# Patient Record
Sex: Female | Born: 1995 | Race: Black or African American | Hispanic: No | Marital: Married | Smoking: Current every day smoker
Health system: Southern US, Community
[De-identification: ages and names within clinical notes are randomized; demographics above are authoritative.]

## PROBLEM LIST (undated history)

## (undated) ENCOUNTER — Inpatient Hospital Stay (HOSPITAL_COMMUNITY): Payer: Self-pay

## (undated) DIAGNOSIS — D649 Anemia, unspecified: Secondary | ICD-10-CM

## (undated) DIAGNOSIS — Z3490 Encounter for supervision of normal pregnancy, unspecified, unspecified trimester: Secondary | ICD-10-CM

## (undated) DIAGNOSIS — Z789 Other specified health status: Secondary | ICD-10-CM

## (undated) DIAGNOSIS — N83209 Unspecified ovarian cyst, unspecified side: Secondary | ICD-10-CM

## (undated) DIAGNOSIS — F32A Depression, unspecified: Secondary | ICD-10-CM

## (undated) DIAGNOSIS — R519 Headache, unspecified: Secondary | ICD-10-CM

## (undated) DIAGNOSIS — F419 Anxiety disorder, unspecified: Secondary | ICD-10-CM

## (undated) DIAGNOSIS — F329 Major depressive disorder, single episode, unspecified: Secondary | ICD-10-CM

## (undated) DIAGNOSIS — R51 Headache: Secondary | ICD-10-CM

## (undated) HISTORY — PX: NO PAST SURGERIES: SHX2092

---
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
## 2015-03-03 ENCOUNTER — Emergency Department (HOSPITAL_COMMUNITY)
Admission: EM | Admit: 2015-03-03 | Discharge: 2015-03-03 | Disposition: A | Discharge status: Home / Self Care | Payer: Medicaid Other | Attending: Emergency Medicine | Admitting: Emergency Medicine

## 2015-03-03 ENCOUNTER — Encounter (HOSPITAL_COMMUNITY): Payer: Self-pay | Admitting: Emergency Medicine

## 2015-03-03 DIAGNOSIS — Z79818 Long term (current) use of other agents affecting estrogen receptors and estrogen levels: Secondary | ICD-10-CM | POA: Insufficient documentation

## 2015-03-03 DIAGNOSIS — Z3202 Encounter for pregnancy test, result negative: Secondary | ICD-10-CM | POA: Insufficient documentation

## 2015-03-03 DIAGNOSIS — Z79899 Other long term (current) drug therapy: Secondary | ICD-10-CM | POA: Insufficient documentation

## 2015-03-03 DIAGNOSIS — D649 Anemia, unspecified: Secondary | ICD-10-CM | POA: Insufficient documentation

## 2015-03-03 DIAGNOSIS — F17210 Nicotine dependence, cigarettes, uncomplicated: Secondary | ICD-10-CM | POA: Insufficient documentation

## 2015-03-03 DIAGNOSIS — K529 Noninfective gastroenteritis and colitis, unspecified: Secondary | ICD-10-CM | POA: Insufficient documentation

## 2015-03-03 LAB — LIPASE, BLOOD: LIPASE: 23 U/L (ref 11–51)

## 2015-03-03 LAB — CBC
HEMATOCRIT: 42.6 % (ref 36.0–46.0)
Hemoglobin: 14.7 g/dL (ref 12.0–15.0)
MCH: 32.1 pg (ref 26.0–34.0)
MCHC: 34.5 g/dL (ref 30.0–36.0)
MCV: 93 fL (ref 78.0–100.0)
Platelets: 230 10*3/uL (ref 150–400)
RBC: 4.58 MIL/uL (ref 3.87–5.11)
RDW: 13.2 % (ref 11.5–15.5)
WBC: 6.7 10*3/uL (ref 4.0–10.5)

## 2015-03-03 LAB — URINALYSIS, ROUTINE W REFLEX MICROSCOPIC
BILIRUBIN URINE: NEGATIVE
Glucose, UA: NEGATIVE mg/dL
Hgb urine dipstick: NEGATIVE
KETONES UR: 15 mg/dL — AB
LEUKOCYTES UA: NEGATIVE
NITRITE: NEGATIVE
PH: 5.5 (ref 5.0–8.0)
PROTEIN: NEGATIVE mg/dL
Specific Gravity, Urine: 1.028 (ref 1.005–1.030)

## 2015-03-03 LAB — I-STAT BETA HCG BLOOD, ED (MC, WL, AP ONLY)

## 2015-03-03 LAB — COMPREHENSIVE METABOLIC PANEL
ALBUMIN: 4.1 g/dL (ref 3.5–5.0)
ALT: 29 U/L (ref 14–54)
AST: 24 U/L (ref 15–41)
Alkaline Phosphatase: 68 U/L (ref 38–126)
Anion gap: 9 (ref 5–15)
BUN: 10 mg/dL (ref 6–20)
CALCIUM: 9.3 mg/dL (ref 8.9–10.3)
CHLORIDE: 107 mmol/L (ref 101–111)
CO2: 22 mmol/L (ref 22–32)
Creatinine, Ser: 0.68 mg/dL (ref 0.44–1.00)
GFR calc non Af Amer: >60 mL/min (ref >60)
Glucose, Bld: 103 mg/dL — ABNORMAL HIGH (ref 65–99)
Potassium: 4.1 mmol/L (ref 3.5–5.1)
SODIUM: 138 mmol/L (ref 135–145)
TOTAL PROTEIN: 7.1 g/dL (ref 6.5–8.1)
Total Bilirubin: 0.8 mg/dL (ref 0.3–1.2)

## 2015-03-03 MED ORDER — ONDANSETRON 4 MG PO TBDP
ORAL_TABLET | ORAL | Status: AC
  Filled 2015-03-03: qty 1

## 2015-03-03 MED ORDER — ONDANSETRON 4 MG PO TBDP
4.0000 mg | ORAL_TABLET | Freq: Once | ORAL | Status: AC | PRN
  Administered 2015-03-03: 4 mg via ORAL

## 2015-03-03 MED ORDER — KETOROLAC TROMETHAMINE 60 MG/2ML IM SOLN
60.0000 mg | Freq: Once | INTRAMUSCULAR | Status: AC
  Administered 2015-03-03: 60 mg via INTRAMUSCULAR
  Filled 2015-03-03: qty 2

## 2015-03-03 MED ORDER — ONDANSETRON HCL 4 MG PO TABS: 4.0000 mg | ORAL_TABLET | Freq: Four times a day (QID) | ORAL | Status: DC

## 2015-03-03 NOTE — ED Notes (Signed)
Pt stable, ambulatory, states understanding of discharge instructions 

## 2015-03-03 NOTE — Discharge Instructions (Signed)
Return to ED with fevers, chills, worsening abdominal pain, change in abdominal pain, nausea and vomiting not controlled with zofran, blood in your vomit or stool, passing out, vaginal discharge or any other new, worsening or concerning symptoms.  Viral Gastroenteritis  Viral gastroenteritis is also known as stomach flu. This condition affects the stomach and intestinal tract. It can cause sudden diarrhea and vomiting. The illness typically lasts 3 to 8 days. Most people develop an immune response that eventually gets rid of the virus. While this natural response develops, the virus can make you quite ill.  CAUSES  Many different viruses can cause gastroenteritis, such as rotavirus or noroviruses. You can catch one of these viruses by consuming contaminated food or water. You may also catch a virus by sharing utensils or other personal items with an infected person or by touching a contaminated surface.  SYMPTOMS  The most common symptoms are diarrhea and vomiting. These problems can cause a severe loss of body fluids (dehydration) and a body salt (electrolyte) imbalance. Other symptoms may include:  Fever.  Headache.  Fatigue.  Abdominal pain. DIAGNOSIS  Your caregiver can usually diagnose viral gastroenteritis based on your symptoms and a physical exam. A stool sample may also be taken to test for the presence of viruses or other infections.  TREATMENT  This illness typically goes away on its own. Treatments are aimed at rehydration. The most serious cases of viral gastroenteritis involve vomiting so severely that you are not able to keep fluids down. In these cases, fluids must be given through an intravenous line (IV).  HOME CARE INSTRUCTIONS  Drink enough fluids to keep your urine clear or pale yellow. Drink small amounts of fluids frequently and increase the amounts as tolerated.  Ask your caregiver for specific rehydration instructions.  Avoid:  Foods high in sugar.  Alcohol.  Carbonated  drinks.  Tobacco.  Juice.  Caffeine drinks.  Extremely hot or cold fluids.  Fatty, greasy foods.  Too much intake of anything at one time.  Dairy products until 24 to 48 hours after diarrhea stops. You may consume probiotics. Probiotics are active cultures of beneficial bacteria. They may lessen the amount and number of diarrheal stools in adults. Probiotics can be found in yogurt with active cultures and in supplements.  Wash your hands well to avoid spreading the virus.  Only take over-the-counter or prescription medicines for pain, discomfort, or fever as directed by your caregiver. Do not give aspirin to children. Antidiarrheal medicines are not recommended.  Ask your caregiver if you should continue to take your regular prescribed and over-the-counter medicines.  Keep all follow-up appointments as directed by your caregiver. SEEK IMMEDIATE MEDICAL CARE IF:  You are unable to keep fluids down.  You do not urinate at least once every 6 to 8 hours.  You develop shortness of breath.  You notice blood in your stool or vomit. This may look like coffee grounds.  You have abdominal pain that increases or is concentrated in one small area (localized).  You have persistent vomiting or diarrhea.  You have a fever.  The patient is a child younger than 3 months, and he or she has a fever.  The patient is a child older than 3 months, and he or she has a fever and persistent symptoms.  The patient is a child older than 3 months, and he or she has a fever and symptoms suddenly get worse.  The patient is a baby, and he or she has  no tears when crying. MAKE SURE YOU:  Understand these instructions.  Will watch your condition.  Will get help right away if you are not doing well or get worse. This information is not intended to replace advice given to you by your health care provider. Make sure you discuss any questions you have with your health care provider.  Document Released: 12/26/2004 Document  Revised: 03/20/2011 Document Reviewed: 10/12/2010  Elsevier Interactive Patient Education [employer]

## 2015-03-03 NOTE — ED Provider Notes (Signed)
CSN: [id number]     Arrival date & time 03/03/15  1315 History  By signing my name below, I, Budd Palmer, attest that this documentation has been prepared under the direction and in the presence of Cherese Lozano, PA-C. Electronically Signed: Budd Palmer, ED Scribe. 03/03/2015. 4:12 PM.     Chief Complaint  Patient presents with  . Abdominal Pain  . Diarrhea   Patient is a 20 y.o. female presenting with abdominal pain. The history is provided by the patient. No language interpreter was used.  Abdominal Pain Pain location:  Generalized Pain quality: sharp and stabbing   Pain radiates to:  Does not radiate Pain severity:  Moderate Duration:  1 day Timing:  Constant Progression:  Unchanged Context: sick contacts   Context: not awakening from sleep, not diet changes, not eating and not suspicious food intake   Relieved by:  Not moving and lying down Worsened by:  Movement Ineffective treatments:  None tried Associated symptoms: belching, diarrhea, nausea and vomiting   Associated symptoms: no chills, no fever, no hematemesis, no hematochezia and no vaginal discharge    HPI Comments: Nancy Henson is a 20 y.o. female smoker at 0.50 ppd who presents to the Emergency Department complaining of constant, stabbing, worsening, diffuse abdominal pain and diarrhea onset 1 day ago. She reports associated nausea, vomiting, generalized fatigue, and increased burping. She denies blood in her vomit or stool. She states she has not been able to eat or drink since last night due to the nausea. She notes alleviation of the pain with lying still, and exacerbation with standing up and movement. She reports no change of the pain with having a BM. She notes a sick contact (boyfriend) with similar symptoms. She notes she is currently menstruating. She denies any concern for STD's. Denies fevers, chills, headache, dizziness, syncope, URI symptoms, cough, hematuria, back pain, flank pain, vaginal discharge  or pelvic pain. No hx of abdominal surgeries.   Past Medical History  Diagnosis Date  . No pertinent past medical history   . Headache   . Anemia    Past Surgical History  Procedure Laterality Date  . No past surgeries     Family History  Problem Relation Age of Onset  . Alcohol abuse Neg Hx   . Arthritis Neg Hx   . Asthma Neg Hx   . Birth defects Neg Hx   . Cancer Neg Hx   . COPD Neg Hx   . Depression Neg Hx   . Diabetes Neg Hx   . Drug abuse Neg Hx   . Early death Neg Hx   . Hearing loss Neg Hx   . Heart disease Neg Hx   . Hyperlipidemia Neg Hx   . Hypertension Neg Hx   . Kidney disease Neg Hx   . Learning disabilities Neg Hx   . Mental illness Neg Hx   . Mental retardation Neg Hx   . Miscarriages / Stillbirths Neg Hx   . Stroke Neg Hx   . Vision loss Neg Hx   . Varicose Veins Neg Hx    Social History  Substance Use Topics  . Smoking status: Current Every Day Smoker -- 0.50 packs/day    Types: Cigarettes  . Smokeless tobacco: Never Used  . Alcohol Use: No   OB History    Gravida Para Term Preterm AB TAB SAB Ectopic Multiple Living   3 3 3       0 3     Review of  Systems  Constitutional: Negative for fever and chills.  Gastrointestinal: Positive for nausea, vomiting, abdominal pain and diarrhea. Negative for hematochezia and hematemesis.  Genitourinary: Negative for vaginal discharge.  Neurological: Positive for weakness.  All other systems reviewed and are negative.   Allergies  Review of patient's allergies indicates no known allergies.  Home Medications   Prior to Admission medications   Medication Sig Start Date End Date Taking? Authorizing Provider  butalbital-acetaminophen-caffeine (ESGIC PLUS) 50-500-40 MG tablet Take 1-2 tablets by mouth every 4 (four) hours as needed for pain or headache. 11/16/14   Adam Phenix, MD  ferrous sulfate 325 (65 FE) MG tablet Take 325 mg by mouth daily with breakfast.    Historical Provider, MD  ibuprofen  (ADVIL,MOTRIN) 600 MG tablet Take 1 tablet (600 mg total) by mouth every 6 (six) hours. 10/12/14   Montez Morita, CNM  norgestimate-ethinyl estradiol (ORTHO-CYCLEN,SPRINTEC,PREVIFEM) 0.25-35 MG-MCG tablet Take 1 tablet by mouth daily. 12/01/14   Peggy Constant, MD  ondansetron (ZOFRAN) 4 MG tablet Take 1 tablet (4 mg total) by mouth every 6 (six) hours. 03/03/15   Bonni Neuser, PA-C  phenylephrine-shark liver oil-mineral oil-petrolatum (PREPARATION H) 0.25-3-14-71.9 % rectal ointment Place 1 application rectally 2 (two) times daily as needed for hemorrhoids.    Historical Provider, MD   BP 114/78 mmHg  Pulse 89  Temp(Src) 98 F (36.7 C) (Oral)  Resp 16  SpO2 98%  LMP 03/03/2015 Physical Exam  Constitutional: She is oriented to person, place, and time. She appears well-developed and well-nourished.  Nontoxic appearing  HENT:  Head: Normocephalic and atraumatic.  Eyes: Conjunctivae and EOM are normal. Right eye exhibits no discharge. Left eye exhibits no discharge.  Neck: Normal range of motion. Neck supple.  Cardiovascular: Normal rate, regular rhythm and normal heart sounds.   Pulmonary/Chest: Effort normal and breath sounds normal. No respiratory distress.  Abdominal: Soft. Normal appearance. Bowel sounds are decreased. There is generalized tenderness. There is no rigidity, no rebound, no guarding and no CVA tenderness.  Mild generalized tenderness without peritoneal signs. No focal tenderness.   Musculoskeletal: Normal range of motion.  Walks with a steady gait  Neurological: She is alert and oriented to person, place, and time. Coordination normal.  Skin: Skin is warm and dry. She is not diaphoretic.  Psychiatric: She has a normal mood and affect. Her behavior is normal.  Nursing note and vitals reviewed.   ED Course  Procedures  DIAGNOSTIC STUDIES: Oxygen Saturation is 98% on RA, normal by my interpretation.    COORDINATION OF CARE: 4:12 PM - Discussed normal lab results,  probable viral gastroenteritis, and plans to wait on urinalysis. Will order medication for nausea and pain. Will also write a note for work. Pt advised of plan for treatment and pt agrees.  Labs Review Labs Reviewed  COMPREHENSIVE METABOLIC PANEL - Abnormal; Notable for the following:    Glucose, Bld 103 (*)    All other components within normal limits  URINALYSIS, ROUTINE W REFLEX MICROSCOPIC (NOT AT [facility]) - Abnormal; Notable for the following:    Color, Urine AMBER (*)    Ketones, ur 15 (*)    All other components within normal limits  LIPASE, BLOOD  CBC  I-STAT BETA HCG BLOOD, ED ([facility], [facility], [facility] ONLY)    Imaging Review No results found. I have personally reviewed and evaluated these images and lab results as part of my medical decision-making.   EKG Interpretation None      MDM  Final diagnoses:  Gastroenteritis   Patient presenting with abdominal pain, nausea, vomiting and diarrhea x 1 day. Symptoms consistent with viral gastroenteritis. Toradol and zofran given in ED. Pt declines IV stating that she does not want one and will drink ginger ale instead. Vitals are stable, patient is afebrile and appears in no acute distress. Mucus membranes are moist. Abdomen is soft with mild generalized tenderness. No focal tenderness and no peritoneal signs. Discussed that there is no indication for imaging at this time. Blood work reviewed and unremarkable. Patient is tolerating PO liquids before discharge. Will discharge home with supportive therapy. Strict return precautions discussed with patient at bedside and given in discharge paperwork. Patient is stable for discharge.   I personally performed the services described in this documentation, which was scribed in my presence. The recorded information has been reviewed and is accurate.   Rolm Gala Blanca Thornton, PA-C 03/03/15 1823  Alvira Monday, MD 03/04/15 1242

## 2015-03-03 NOTE — ED Notes (Signed)
Pt from home for eva of lower abd pain x2 days with n/v/d. Pt states she is currently on menstrual, denies any hemoptysis or blood in stool pt states doesn't have an appetite.

## 2015-03-18 ENCOUNTER — Emergency Department (INDEPENDENT_AMBULATORY_CARE_PROVIDER_SITE_OTHER)
Admission: EM | Admit: 2015-03-18 | Discharge: 2015-03-18 | Disposition: A | Discharge status: Home / Self Care | Payer: Self-pay | Source: Home / Self Care | Attending: Emergency Medicine | Admitting: Emergency Medicine

## 2015-03-18 ENCOUNTER — Encounter (HOSPITAL_COMMUNITY): Payer: Self-pay | Admitting: Emergency Medicine

## 2015-03-18 DIAGNOSIS — J069 Acute upper respiratory infection, unspecified: Secondary | ICD-10-CM

## 2015-03-18 MED ORDER — IPRATROPIUM BROMIDE 0.06 % NA SOLN: 2.0000 | Freq: Four times a day (QID) | NASAL | Status: DC

## 2015-03-18 NOTE — ED Provider Notes (Signed)
CSN: [id number]     Arrival date & time 03/18/15  1413 History   First MD Initiated Contact with Patient 03/18/15 1505     Chief Complaint  Patient presents with  . URI   (Consider location/radiation/quality/duration/timing/severity/associated sxs/prior Treatment) HPI Comments: 20 year old female complaining of a cough, sniffles, stuffy nose, posttussive emesis, PND. She states yesterday her temperature of 103. She had a headache and was in bed all day yesterday. She has also been working 2 jobs over the past couple of weeks. 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 Currently she is afebrile.  Patient is a 20 y.o. female presenting with URI.  URI Presenting symptoms: congestion, cough, fever and rhinorrhea   Presenting symptoms: no fatigue   Associated symptoms: no neck pain     Past Medical History  Diagnosis Date  . No pertinent past medical history   . Headache   . Anemia    Past Surgical History  Procedure Laterality Date  . No past surgeries     Family History  Problem Relation Age of Onset  . Alcohol abuse Neg Hx   . Arthritis Neg Hx   . Asthma Neg Hx   . Birth defects Neg Hx   . Cancer Neg Hx   . COPD Neg Hx   . Depression Neg Hx   . Diabetes Neg Hx   . Drug abuse Neg Hx   . Early death Neg Hx   . Hearing loss Neg Hx   . Heart disease Neg Hx   . Hyperlipidemia Neg Hx   . Hypertension Neg Hx   . Kidney disease Neg Hx   . Learning disabilities Neg Hx   . Mental illness Neg Hx   . Mental retardation Neg Hx   . Miscarriages / Stillbirths Neg Hx   . Stroke Neg Hx   . Vision loss Neg Hx   . Varicose Veins Neg Hx    Social History  Substance Use Topics  . Smoking status: Current Every Day Smoker -- 0.50 packs/day    Types: Cigarettes  . Smokeless tobacco: Never Used  . Alcohol Use: No   OB History    Gravida Para Term Preterm AB TAB SAB Ectopic Multiple Living   0 3     Review of Systems  Constitutional: Positive for fever,  activity change and appetite change. Negative for chills and fatigue.  HENT: Positive for congestion, postnasal drip and rhinorrhea. Negative for facial swelling.   Eyes: Negative.   Respiratory: Positive for cough. Negative for shortness of breath.   Cardiovascular: Negative.   Gastrointestinal: Negative for abdominal pain.  Musculoskeletal: Negative for neck pain and neck stiffness.  Skin: Negative for pallor and rash.  Neurological: Negative.     Allergies  Review of patient's allergies indicates no known allergies.  Home Medications   Prior to Admission medications   Medication Sig Start Date End Date Taking? Authorizing Provider  etonogestrel (NEXPLANON) 68 MG IMPL implant 1 each by Subdermal route once.   Yes Historical Provider, MD  guaiFENesin (MUCINEX) 600 MG 12 hr tablet Take by mouth 2 (two) times daily.   Yes Historical Provider, MD  Pseudoeph-Doxylamine-DM-APAP (NYQUIL PO) Take by mouth.   Yes Historical Provider, MD  butalbital-acetaminophen-caffeine (ESGIC PLUS) 50-500-40 MG tablet Take 1-2 tablets by mouth every 4 (four) hours as needed for pain or headache. 11/16/14   Adam Phenix, MD  ferrous sulfate 325 (65 FE) MG  tablet Take 325 mg by mouth daily with breakfast.    Historical Provider, MD  ibuprofen (ADVIL,MOTRIN) 600 MG tablet Take 1 tablet (600 mg total) by mouth every 6 (six) hours. 10/12/14   Montez MoritaMarie D Lawson, CNM  ipratropium (ATROVENT) 0.06 % nasal spray Place 2 sprays into both nostrils 4 (four) times daily. 03/18/15   Hayden Rasmussenavid Rekisha Welling, NP  norgestimate-ethinyl estradiol (ORTHO-CYCLEN,SPRINTEC,PREVIFEM) 0.25-35 MG-MCG tablet Take 1 tablet by mouth daily. Patient not taking: Reported on 03/18/2015 12/01/14   Catalina AntiguaPeggy Constant, MD  ondansetron (ZOFRAN) 4 MG tablet Take 1 tablet (4 mg total) by mouth every 6 (six) hours. 03/03/15   Stevi Barrett, PA-C  phenylephrine-shark liver oil-mineral oil-petrolatum (PREPARATION H) 0.25-3-14-71.9 % rectal ointment Place 1 application rectally  2 (two) times daily as needed for hemorrhoids.    Historical Provider, MD   Meds Ordered and Administered this Visit  Medications - No data to display  BP 110/72 mmHg  Pulse 69  Temp(Src) 98.2 F (36.8 C) (Oral)  Resp 20  SpO2 99%  LMP 03/03/2015 No data found.   Physical Exam  Constitutional: She is oriented to person, place, and time. She appears well-developed and well-nourished. No distress.  HENT:  Bilateral TMs are normal Oropharynx with minor erythema and copious amount of clear PND. No exudates or swelling.  Eyes: Conjunctivae and EOM are normal.  Neck: Normal range of motion. Neck supple.  Cardiovascular: Normal rate, regular rhythm and normal heart sounds.   Pulmonary/Chest: Effort normal and breath sounds normal. No respiratory distress. She has no wheezes. She has no rales.  Musculoskeletal: Normal range of motion. She exhibits no edema.  Lymphadenopathy:    She has no cervical adenopathy.  Neurological: She is alert and oriented to person, place, and time. She exhibits normal muscle tone.  Skin: Skin is warm and dry. No rash noted.  Psychiatric: She has a normal mood and affect.  Nursing note and vitals reviewed.   ED Course  Procedures (including critical care time)  Labs Review Labs Reviewed - No data to display  Imaging Review No results found.   Visual Acuity Review  Right Eye Distance:   Left Eye Distance:   Bilateral Distance:    Right Eye Near:   Left Eye Near:    Bilateral Near:         MDM   1. URI (upper respiratory infection)    Upper Respiratory Infection, Adult You may continue to use NyQuil or Alka-Seltzer cold plus. Or you may take the following medications for symptoms. For nasal and head congestion may take Sudafed PE 10 mg every 4 hours as needed. Saline nasal spray used frequently. For drainage may use Allegra, Claritin or Zyrtec. If you need stronger medicine to stop drainage may take Chlor-Trimeton 2-4 mg every 4  hours. This may cause drowsiness. Ibuprofen 600 mg every 6 hours as needed for pain, discomfort or fever. Drink plenty of fluids and stay well-hydrated. Atrovent nasal spray for runny nose and sniffles    Hayden Rasmussenavid Yamira Papa, NP 03/18/15 1525

## 2015-03-18 NOTE — ED Notes (Signed)
Complaints of not feeling well for 1-3 weeks.  Patient has tried otc cold medicines

## 2015-03-18 NOTE — Discharge Instructions (Signed)
Upper Respiratory Infection, Adult You may continue to use NyQuil or Alka-Seltzer cold plus. Or you may take the following medications for symptoms. For nasal and head congestion may take Sudafed PE 10 mg every 4 hours as needed. Saline nasal spray used frequently. For drainage may use Allegra, Claritin or Zyrtec. If you need stronger medicine to stop drainage may take Chlor-Trimeton 2-4 mg every 4 hours. This may cause drowsiness. Ibuprofen 600 mg every 6 hours as needed for pain, discomfort or fever. Drink plenty of fluids and stay well-hydrated. Atrovent nasal spray for runny nose and sniffles  Most upper respiratory infections (URIs) are a viral infection of the air passages leading to the lungs. A URI affects the nose, throat, and upper air passages. The most common type of URI is nasopharyngitis and is typically referred to as "the common cold." URIs run their course and usually go away on their own. Most of the time, a URI does not require medical attention, but sometimes a bacterial infection in the upper airways can follow a viral infection. This is called a secondary infection. Sinus and middle ear infections are common types of secondary upper respiratory infections. Bacterial pneumonia can also complicate a URI. A URI can worsen asthma and chronic obstructive pulmonary disease (COPD). Sometimes, these complications can require emergency medical care and may be life threatening.  CAUSES Almost all URIs are caused by viruses. A virus is a type of germ and can spread from one person to another.  RISKS FACTORS You may be at risk for a URI if:   You smoke.   You have chronic heart or lung disease.  You have a weakened defense (immune) system.   You are very young or very old.   You have nasal allergies or asthma.  You work in crowded or poorly ventilated areas.  You work in health care facilities or schools. SIGNS AND SYMPTOMS  Symptoms typically develop 2-3 days after you  come in contact with a cold virus. Most viral URIs last 7-10 days. However, viral URIs from the influenza virus (flu virus) can last 14-18 days and are typically more severe. Symptoms may include:   Runny or stuffy (congested) nose.   Sneezing.   Cough.   Sore throat.   Headache.   Fatigue.   Fever.   Loss of appetite.   Pain in your forehead, behind your eyes, and over your cheekbones (sinus pain).  Muscle aches.  DIAGNOSIS  Your health care provider may diagnose a URI by:  Physical exam.  Tests to check that your symptoms are not due to another condition such as:  Strep throat.  Sinusitis.  Pneumonia.  Asthma. TREATMENT  A URI goes away on its own with time. It cannot be cured with medicines, but medicines may be prescribed or recommended to relieve symptoms. Medicines may help:  Reduce your fever.  Reduce your cough.  Relieve nasal congestion. HOME CARE INSTRUCTIONS   Take medicines only as directed by your health care provider.   Gargle warm saltwater or take cough drops to comfort your throat as directed by your health care provider.  Use a warm mist humidifier or inhale steam from a shower to increase air moisture. This may make it easier to breathe.  Drink enough fluid to keep your urine clear or pale yellow.   Eat soups and other clear broths and maintain good nutrition.   Rest as needed.   Return to work when your temperature has returned to normal or as  your health care provider advises. You may need to stay home longer to avoid infecting others. You can also use a face mask and careful hand washing to prevent spread of the virus.  Increase the usage of your inhaler if you have asthma.   Do not use any tobacco products, including cigarettes, chewing tobacco, or electronic cigarettes. If you need help quitting, ask your health care provider. PREVENTION  The best way to protect yourself from getting a cold is to practice good  hygiene.   Avoid oral or hand contact with people with cold symptoms.   Wash your hands often if contact occurs.  There is no clear evidence that vitamin C, vitamin E, echinacea, or exercise reduces the chance of developing a cold. However, it is always recommended to get plenty of rest, exercise, and practice good nutrition.  SEEK MEDICAL CARE IF:   You are getting worse rather than better.   Your symptoms are not controlled by medicine.   You have chills.  You have worsening shortness of breath.  You have brown or red mucus.  You have yellow or brown nasal discharge.  You have pain in your face, especially when you bend forward.  You have a fever.  You have swollen neck glands.  You have pain while swallowing.  You have white areas in the back of your throat. SEEK IMMEDIATE MEDICAL CARE IF:   You have severe or persistent:  Headache.  Ear pain.  Sinus pain.  Chest pain.  You have chronic lung disease and any of the following:  Wheezing.  Prolonged cough.  Coughing up blood.  A change in your usual mucus.  You have a stiff neck.  You have changes in your:  Vision.  Hearing.  Thinking.  Mood. MAKE SURE YOU:   Understand these instructions.  Will watch your condition.  Will get help right away if you are not doing well or get worse.   This information is not intended to replace advice given to you by your health care provider. Make sure you discuss any questions you have with your health care provider.   Document Released: 06/21/2000 Document Revised: 05/12/2014 Document Reviewed: 04/02/2013 Elsevier Interactive Patient Education [employer]

## 2015-03-23 ENCOUNTER — Emergency Department (HOSPITAL_COMMUNITY)
Admission: EM | Admit: 2015-03-23 | Discharge: 2015-03-23 | Disposition: A | Discharge status: Home / Self Care | Payer: Medicaid Other | Attending: Emergency Medicine | Admitting: Emergency Medicine

## 2015-03-23 ENCOUNTER — Encounter (HOSPITAL_COMMUNITY): Payer: Self-pay

## 2015-03-23 DIAGNOSIS — R51 Headache: Secondary | ICD-10-CM | POA: Insufficient documentation

## 2015-03-23 DIAGNOSIS — R05 Cough: Secondary | ICD-10-CM | POA: Insufficient documentation

## 2015-03-23 DIAGNOSIS — R112 Nausea with vomiting, unspecified: Secondary | ICD-10-CM | POA: Insufficient documentation

## 2015-03-23 DIAGNOSIS — D649 Anemia, unspecified: Secondary | ICD-10-CM | POA: Insufficient documentation

## 2015-03-23 DIAGNOSIS — F17210 Nicotine dependence, cigarettes, uncomplicated: Secondary | ICD-10-CM | POA: Insufficient documentation

## 2015-03-23 DIAGNOSIS — R519 Headache, unspecified: Secondary | ICD-10-CM

## 2015-03-23 DIAGNOSIS — Z79899 Other long term (current) drug therapy: Secondary | ICD-10-CM | POA: Insufficient documentation

## 2015-03-23 MED ORDER — METOCLOPRAMIDE HCL 10 MG PO TABS
10.0000 mg | ORAL_TABLET | Freq: Once | ORAL | Status: AC
  Administered 2015-03-23: 10 mg via ORAL
  Filled 2015-03-23: qty 1

## 2015-03-23 MED ORDER — DIVALPROEX SODIUM 250 MG PO DR TAB
250.0000 mg | DELAYED_RELEASE_TABLET | Freq: Once | ORAL | Status: AC
  Administered 2015-03-23: 250 mg via ORAL
  Filled 2015-03-23: qty 1

## 2015-03-23 MED ORDER — KETOROLAC TROMETHAMINE 30 MG/ML IJ SOLN
30.0000 mg | Freq: Once | INTRAMUSCULAR | Status: AC
  Administered 2015-03-23: 30 mg via INTRAMUSCULAR
  Filled 2015-03-23: qty 1

## 2015-03-23 MED ORDER — DIPHENHYDRAMINE HCL 25 MG PO CAPS
50.0000 mg | ORAL_CAPSULE | Freq: Once | ORAL | Status: AC
  Administered 2015-03-23: 50 mg via ORAL
  Filled 2015-03-23: qty 2

## 2015-03-23 MED ORDER — ONDANSETRON HCL 4 MG PO TABS: 4.0000 mg | ORAL_TABLET | Freq: Four times a day (QID) | ORAL | Status: DC

## 2015-03-23 NOTE — ED Notes (Signed)
Patient here with generalized headache and is getting over URI. Alert and oriented, persistent cough with same.

## 2015-03-23 NOTE — Discharge Instructions (Signed)
There is not appear to be an emergent cause for your symptoms at this time. Please follow-up with your doctor for reevaluation next week. Return to ED for new or worsening symptoms as we discussed.  General Headache Without Cause A headache is pain or discomfort felt around the head or neck area. There are many causes and types of headaches. In some cases, the cause may not be found.  HOME CARE  Managing Pain  Take over-the-counter and prescription medicines only as told by your doctor.  Lie down in a dark, quiet room when you have a headache.  If directed, apply ice to the head and neck area:  Put ice in a plastic bag.  Place a towel between your skin and the bag.  Leave the ice on for 20 minutes, 2-3 times per day.  Use a heating pad or hot shower to apply heat to the head and neck area as told by your doctor.  Keep lights dim if bright lights bother you or make your headaches worse. Eating and Drinking  Eat meals on a regular schedule.  Lessen how much alcohol you drink.  Lessen how much caffeine you drink, or stop drinking caffeine. General Instructions  Keep all follow-up visits as told by your doctor. This is important.  Keep a journal to find out if certain things bring on headaches. For example, write down:  What you eat and drink.  How much sleep you get.  Any change to your diet or medicines.  Relax by getting a massage or doing other relaxing activities.  Lessen stress.  Sit up straight. Do not tighten (tense) your muscles.  Do not use tobacco products. This includes cigarettes, chewing tobacco, or e-cigarettes. If you need help quitting, ask your doctor.  Exercise regularly as told by your doctor.  Get enough sleep. This often means 7-9 hours of sleep. GET HELP IF:  Your symptoms are not helped by medicine.  You have a headache that feels different than the other headaches.  You feel sick to your stomach (nauseous) or you throw up (vomit).  You  have a fever. GET HELP RIGHT AWAY IF:   Your headache becomes really bad.  You keep throwing up.  You have a stiff neck.  You have trouble seeing.  You have trouble speaking.  You have pain in the eye or ear.  Your muscles are weak or you lose muscle control.  You lose your balance or have trouble walking.  You feel like you will pass out (faint) or you pass out.  You have confusion.   This information is not intended to replace advice given to you by your health care provider. Make sure you discuss any questions you have with your health care provider.   Document Released: 10/05/2007 Document Revised: 09/16/2014 Document Reviewed: 04/20/2014 Elsevier Interactive Patient Education [employer]2016 Elsevier Inc.

## 2015-03-23 NOTE — ED Provider Notes (Signed)
History  By signing my name below, I, Karle Plumber, attest that this documentation has been prepared under the direction and in the presence of [employer], PA-C. Electronically Signed: Karle Plumber, ED Scribe. 03/23/2015. 1:56 PM.  Chief Complaint  Patient presents with  . Headache   The history is provided by the patient and medical records. No language interpreter was used.    HPI Comments:  Nancy Henson is a 20 y.o. female who presents to the Emergency Department complaining of a migraine that began two days ago. She states the pain is throbbing and worsening. She reports associated cough, nausea and post-tussive emesis. Standing, coughing or bending increases her pain. Light and sound increase her pain as well. She denies alleviating factors. She has taken Tylenol, Ibuprofen and OTC cold medication with minimal relief of the pain. She denies fever, chills, hemiparesis, rash, numbness, tingling or weakness of any extremity, neck pain or stiffness. She reports h/o HA but states this is the worst one she has had.   Past Medical History  Diagnosis Date  . No pertinent past medical history   . Headache   . Anemia    Past Surgical History  Procedure Laterality Date  . No past surgeries     Family History  Problem Relation Age of Onset  . Alcohol abuse Neg Hx   . Arthritis Neg Hx   . Asthma Neg Hx   . Birth defects Neg Hx   . Cancer Neg Hx   . COPD Neg Hx   . Depression Neg Hx   . Diabetes Neg Hx   . Drug abuse Neg Hx   . Early death Neg Hx   . Hearing loss Neg Hx   . Heart disease Neg Hx   . Hyperlipidemia Neg Hx   . Hypertension Neg Hx   . Kidney disease Neg Hx   . Learning disabilities Neg Hx   . Mental illness Neg Hx   . Mental retardation Neg Hx   . Miscarriages / Stillbirths Neg Hx   . Stroke Neg Hx   . Vision loss Neg Hx   . Varicose Veins Neg Hx    Social History  Substance Use Topics  . Smoking status: Current Every Day Smoker -- 0.50 packs/day     Types: Cigarettes  . Smokeless tobacco: Never Used  . Alcohol Use: No   OB History    Gravida Para Term Preterm AB TAB SAB Ectopic Multiple Living   0 3     Review of Systems A complete 10 system review of systems was obtained and all systems are negative except as noted in the HPI and PMH.   Allergies  Review of patient's allergies indicates no known allergies.  Home Medications   Prior to Admission medications   Medication Sig Start Date End Date Taking? Authorizing Provider  butalbital-acetaminophen-caffeine (ESGIC PLUS) 50-500-40 MG tablet Take 1-2 tablets by mouth every 4 (four) hours as needed for pain or headache. 11/16/14   Adam Phenix, MD  etonogestrel (NEXPLANON) 68 MG IMPL implant 1 each by Subdermal route once.    Historical Provider, MD  ferrous sulfate 325 (65 FE) MG tablet Take 325 mg by mouth daily with breakfast.    Historical Provider, MD  guaiFENesin (MUCINEX) 600 MG 12 hr tablet Take by mouth 2 (two) times daily.    Historical Provider, MD  ibuprofen (ADVIL,MOTRIN) 600 MG tablet Take 1 tablet (600 mg total) by  mouth every 6 (six) hours. 10/12/14   Montez Morita, CNM  ipratropium (ATROVENT) 0.06 % nasal spray Place 2 sprays into both nostrils 4 (four) times daily. 03/18/15   Hayden Rasmussen, NP  norgestimate-ethinyl estradiol (ORTHO-CYCLEN,SPRINTEC,PREVIFEM) 0.25-35 MG-MCG tablet Take 1 tablet by mouth daily. Patient not taking: Reported on 03/18/2015 12/01/14   Catalina Antigua, MD  ondansetron (ZOFRAN) 4 MG tablet Take 1 tablet (4 mg total) by mouth every 6 (six) hours. 03/03/15   Stevi Barrett, PA-C  phenylephrine-shark liver oil-mineral oil-petrolatum (PREPARATION H) 0.25-3-14-71.9 % rectal ointment Place 1 application rectally 2 (two) times daily as needed for hemorrhoids.    Historical Provider, MD  Pseudoeph-Doxylamine-DM-APAP (NYQUIL PO) Take by mouth.    Historical Provider, MD   Triage Vitals: BP 110/73 mmHg  Pulse 86  Temp(Src) 98.1 F (36.7 C)  (Oral)  Resp 18  Ht  (1.6 m)  Wt 125 lb (56.7 kg)  BMI 22.15 kg/m2  SpO2 97%  LMP 03/03/2015 Physical Exam  Constitutional: She is oriented to person, place, and time. She appears well-developed and well-nourished. No distress.  HENT:  Head: Normocephalic and atraumatic.  Mouth/Throat: Oropharynx is clear and moist.  Eyes: Conjunctivae and EOM are normal. Pupils are equal, round, and reactive to light.  Neck: Normal range of motion.  No meningismus or nuchal rigidity  Cardiovascular: Normal rate, regular rhythm and normal heart sounds.   Pulmonary/Chest: Effort normal and breath sounds normal.  Abdominal: Soft. There is no tenderness.  Musculoskeletal: Normal range of motion. She exhibits no edema or tenderness.  Neurological: She is alert and oriented to person, place, and time.  Cranial nerves III through XII grossly intact. Motor strength is 5/5 in all 4 extremities and sensation is intact to light touch. Completes finger to nose coordination movements without difficulty. No nystagmus. Gait is baseline without ataxia.  Skin: Skin is warm and dry. She is not diaphoretic.  Psychiatric: She has a normal mood and affect. Her behavior is normal.  Nursing note and vitals reviewed.   ED Course  Procedures (including critical care time) DIAGNOSTIC STUDIES: Oxygen Saturation is 97% on RA, normal by my interpretation.   COORDINATION OF CARE: 1:11 PM- Will order injection of Toradol, PO Benadryl and PO Reglan. Pt verbalizes understanding and agrees to plan.  Medications  divalproex (DEPAKOTE) DR tablet 250 mg (not administered)  ketorolac (TORADOL) 30 MG/ML injection 30 mg (30 mg Intramuscular Given 03/23/15 1326)  metoCLOPramide (REGLAN) tablet 10 mg (10 mg Oral Given 03/23/15 1326)  diphenhydrAMINE (BENADRYL) capsule 50 mg (50 mg Oral Given 03/23/15 1325)     MDM  Vitals stable, afebrile Headache is improved with medications, but has not fully resolved. Normal neurological  exam. No dizziness.No meningismus, nuchal rigidity, rash.  Gait baseline. HA gradual in onset, progressively worsening. Similar to previous HA No hx of traumatic injury No hx of clotting disorder, peripartum or postpartum state, Immunocompromise No suspicion for meningitis, CVA, dural venous thrombosis, seizure or other emergent intracranial abnormality. Ambulates throughout the ED without difficulty. Resting comfortably in bed. I have personally reviewed all labs, imaging, nursing/prvious notes during the patient's evaluation in the ED today. No evidence of other acute or emergent pathology that requires immediate intervention at this time. Pt stable, in good condition and is appropriate for discharge. DC with Zofran and instructions to follow up with PCP within 48 hrs for further evaluation and management of symptoms.     Final diagnoses:  Intractable headache, unspecified chronicity pattern, unspecified headache type  I personally performed the services described in this documentation, which was scribed in my presence. The recorded information has been reviewed and is accurate.     Joycie PeekBenjamin Deltha Bernales, PA-C 03/23/15 1503  Alvira MondayErin Schlossman, MD 03/24/15 [phone]

## 2015-03-23 NOTE — ED Notes (Signed)
Ambulated in hallway.

## 2015-06-25 ENCOUNTER — Emergency Department (HOSPITAL_COMMUNITY)
Admission: EM | Admit: 2015-06-25 | Discharge: 2015-06-26 | Disposition: A | Discharge status: Home / Self Care | Payer: Medicaid Other | Attending: Emergency Medicine | Admitting: Emergency Medicine

## 2015-06-25 DIAGNOSIS — Y939 Activity, unspecified: Secondary | ICD-10-CM | POA: Diagnosis not present

## 2015-06-25 DIAGNOSIS — S21209A Unspecified open wound of unspecified back wall of thorax without penetration into thoracic cavity, initial encounter: Secondary | ICD-10-CM | POA: Diagnosis not present

## 2015-06-25 DIAGNOSIS — Y929 Unspecified place or not applicable: Secondary | ICD-10-CM | POA: Insufficient documentation

## 2015-06-25 DIAGNOSIS — Y999 Unspecified external cause status: Secondary | ICD-10-CM | POA: Diagnosis not present

## 2015-06-25 DIAGNOSIS — F17210 Nicotine dependence, cigarettes, uncomplicated: Secondary | ICD-10-CM | POA: Insufficient documentation

## 2015-06-25 DIAGNOSIS — T148XXA Other injury of unspecified body region, initial encounter: Secondary | ICD-10-CM

## 2015-06-25 DIAGNOSIS — S40912A Unspecified superficial injury of left shoulder, initial encounter: Secondary | ICD-10-CM | POA: Insufficient documentation

## 2015-06-25 DIAGNOSIS — S91302A Unspecified open wound, left foot, initial encounter: Secondary | ICD-10-CM | POA: Diagnosis not present

## 2015-06-25 DIAGNOSIS — S41111A Laceration without foreign body of right upper arm, initial encounter: Secondary | ICD-10-CM | POA: Insufficient documentation

## 2015-06-25 DIAGNOSIS — W269XXA Contact with unspecified sharp object(s), initial encounter: Secondary | ICD-10-CM | POA: Diagnosis not present

## 2015-06-25 DIAGNOSIS — S4991XA Unspecified injury of right shoulder and upper arm, initial encounter: Secondary | ICD-10-CM | POA: Diagnosis present

## 2015-06-25 HISTORY — DX: Encounter for supervision of normal pregnancy, unspecified, unspecified trimester: Z34.90

## 2015-06-26 ENCOUNTER — Encounter (HOSPITAL_COMMUNITY): Payer: Self-pay | Admitting: *Deleted

## 2015-06-26 ENCOUNTER — Emergency Department (HOSPITAL_COMMUNITY): Payer: Medicaid Other

## 2015-06-26 LAB — SAMPLE TO BLOOD BANK

## 2015-06-26 MED ORDER — LIDOCAINE-EPINEPHRINE 1 %-1:100000 IJ SOLN
10.0000 mL | Freq: Once | INTRAMUSCULAR | Status: AC
  Administered 2015-06-26: 10 mL
  Filled 2015-06-26: qty 1

## 2015-06-26 MED ORDER — IBUPROFEN 800 MG PO TABS
800.0000 mg | ORAL_TABLET | Freq: Once | ORAL | Status: AC
  Administered 2015-06-26: 800 mg via ORAL
  Filled 2015-06-26: qty 1

## 2015-06-26 MED ORDER — ACETAMINOPHEN 500 MG PO TABS
1000.0000 mg | ORAL_TABLET | Freq: Once | ORAL | Status: AC
  Administered 2015-06-26: 1000 mg via ORAL
  Filled 2015-06-26: qty 2
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
## 2015-06-26 NOTE — ED Notes (Signed)
Pt initially refusing sutures to wounds, small amount of bleeding noted to L axilla and L flank with movement. Pt requesting sutures at discharge. Dr.Floyd at bedside

## 2015-06-26 NOTE — ED Notes (Signed)
Pt now refusing sutures by provider.

## 2015-06-26 NOTE — ED Notes (Signed)
Bandages applied to wounds; extra supplied given; paper scrubs given to pt

## 2016-05-18 ENCOUNTER — Encounter (HOSPITAL_COMMUNITY): Payer: Self-pay | Admitting: Emergency Medicine

## 2016-05-18 ENCOUNTER — Emergency Department (HOSPITAL_COMMUNITY)
Admission: EM | Admit: 2016-05-18 | Discharge: 2016-05-18 | Disposition: A | Discharge status: Home / Self Care | Payer: Medicaid Other | Attending: Emergency Medicine | Admitting: Emergency Medicine

## 2016-05-18 DIAGNOSIS — F17210 Nicotine dependence, cigarettes, uncomplicated: Secondary | ICD-10-CM | POA: Insufficient documentation

## 2016-05-18 DIAGNOSIS — Z79899 Other long term (current) drug therapy: Secondary | ICD-10-CM | POA: Diagnosis not present

## 2016-05-18 DIAGNOSIS — R55 Syncope and collapse: Secondary | ICD-10-CM | POA: Insufficient documentation

## 2016-05-18 LAB — CBC WITH DIFFERENTIAL/PLATELET
Basophils Absolute: 0.1 10*3/uL (ref 0.0–0.1)
Basophils Relative: 1 %
Eosinophils Absolute: 0.2 10*3/uL (ref 0.0–0.7)
Eosinophils Relative: 2 %
HCT: 40.8 % (ref 36.0–46.0)
Hemoglobin: 14.3 g/dL (ref 12.0–15.0)
Lymphocytes Relative: 25 %
Lymphs Abs: 1.9 10*3/uL (ref 0.7–4.0)
MCH: 32.4 pg (ref 26.0–34.0)
MCHC: 35 g/dL (ref 30.0–36.0)
MCV: 92.3 fL (ref 78.0–100.0)
Monocytes Absolute: 0.5 10*3/uL (ref 0.1–1.0)
Monocytes Relative: 7 %
Neutro Abs: 5 10*3/uL (ref 1.7–7.7)
Neutrophils Relative %: 65 %
Platelets: 234 10*3/uL (ref 150–400)
RBC: 4.42 MIL/uL (ref 3.87–5.11)
RDW: 12.6 % (ref 11.5–15.5)
WBC: 7.6 10*3/uL (ref 4.0–10.5)

## 2016-05-18 LAB — URINALYSIS, ROUTINE W REFLEX MICROSCOPIC
Bilirubin Urine: NEGATIVE
Glucose, UA: NEGATIVE mg/dL
Hgb urine dipstick: NEGATIVE
Ketones, ur: NEGATIVE mg/dL
Nitrite: NEGATIVE
Protein, ur: NEGATIVE mg/dL
Specific Gravity, Urine: 1.01 (ref 1.005–1.030)
pH: 9 — ABNORMAL HIGH (ref 5.0–8.0)

## 2016-05-18 LAB — BASIC METABOLIC PANEL
Anion gap: 7 (ref 5–15)
BUN: 10 mg/dL (ref 6–20)
CO2: 23 mmol/L (ref 22–32)
Calcium: 9.1 mg/dL (ref 8.9–10.3)
Chloride: 107 mmol/L (ref 101–111)
Creatinine, Ser: 0.54 mg/dL (ref 0.44–1.00)
GFR calc Af Amer: >60 mL/min (ref >60)
GFR calc non Af Amer: >60 mL/min (ref >60)
Glucose, Bld: 106 mg/dL — ABNORMAL HIGH (ref 65–99)
Potassium: 3.6 mmol/L (ref 3.5–5.1)
Sodium: 137 mmol/L (ref 135–145)

## 2016-05-18 LAB — PREGNANCY, URINE: Preg Test, Ur: NEGATIVE

## 2016-05-18 MED ORDER — SODIUM CHLORIDE 0.9 % IV BOLUS (SEPSIS)
1000.0000 mL | Freq: Once | INTRAVENOUS | Status: AC
  Administered 2016-05-18: 1000 mL via INTRAVENOUS

## 2016-05-18 NOTE — ED Notes (Signed)
Patient verbalizes understanding of discharge instructions, home care and follow up care. Patient out of department at this time. 
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
## 2016-05-18 NOTE — Discharge Instructions (Signed)
Return here as needed.  Follow-up with your primary care doctor, increase your fluid intake °

## 2016-05-18 NOTE — ED Notes (Signed)
Patient tolerated PO fluid and food. No complaints at this time. Patient resting in a position of stated comfort. No needs voiced.
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
## 2016-06-18 ENCOUNTER — Emergency Department (HOSPITAL_COMMUNITY)
Admission: EM | Admit: 2016-06-18 | Discharge: 2016-06-19 | Disposition: A | Discharge status: Other Acute Inpatient Hospital | Payer: Medicaid Other | Attending: Emergency Medicine | Admitting: Emergency Medicine

## 2016-06-18 ENCOUNTER — Ambulatory Visit (HOSPITAL_COMMUNITY)
Admission: RE | Admit: 2016-06-18 | Discharge: 2016-06-18 | Disposition: A | Discharge status: Home / Self Care | Payer: Medicaid Other | Attending: Psychiatry | Admitting: Psychiatry

## 2016-06-18 ENCOUNTER — Encounter (HOSPITAL_COMMUNITY): Payer: Self-pay

## 2016-06-18 DIAGNOSIS — F1410 Cocaine abuse, uncomplicated: Secondary | ICD-10-CM | POA: Diagnosis not present

## 2016-06-18 DIAGNOSIS — F332 Major depressive disorder, recurrent severe without psychotic features: Secondary | ICD-10-CM | POA: Diagnosis present

## 2016-06-18 DIAGNOSIS — T1491XA Suicide attempt, initial encounter: Secondary | ICD-10-CM

## 2016-06-18 DIAGNOSIS — Y929 Unspecified place or not applicable: Secondary | ICD-10-CM | POA: Diagnosis not present

## 2016-06-18 DIAGNOSIS — Y939 Activity, unspecified: Secondary | ICD-10-CM | POA: Diagnosis not present

## 2016-06-18 DIAGNOSIS — R45850 Homicidal ideations: Secondary | ICD-10-CM

## 2016-06-18 DIAGNOSIS — S51812A Laceration without foreign body of left forearm, initial encounter: Secondary | ICD-10-CM | POA: Insufficient documentation

## 2016-06-18 DIAGNOSIS — S51811A Laceration without foreign body of right forearm, initial encounter: Secondary | ICD-10-CM | POA: Diagnosis not present

## 2016-06-18 DIAGNOSIS — S61511A Laceration without foreign body of right wrist, initial encounter: Secondary | ICD-10-CM | POA: Diagnosis not present

## 2016-06-18 DIAGNOSIS — Z8659 Personal history of other mental and behavioral disorders: Secondary | ICD-10-CM

## 2016-06-18 DIAGNOSIS — X789XXA Intentional self-harm by unspecified sharp object, initial encounter: Secondary | ICD-10-CM | POA: Diagnosis not present

## 2016-06-18 DIAGNOSIS — S61512A Laceration without foreign body of left wrist, initial encounter: Secondary | ICD-10-CM | POA: Diagnosis not present

## 2016-06-18 DIAGNOSIS — Z79899 Other long term (current) drug therapy: Secondary | ICD-10-CM | POA: Insufficient documentation

## 2016-06-18 DIAGNOSIS — Z046 Encounter for general psychiatric examination, requested by authority: Secondary | ICD-10-CM

## 2016-06-18 DIAGNOSIS — Y999 Unspecified external cause status: Secondary | ICD-10-CM | POA: Diagnosis not present

## 2016-06-18 DIAGNOSIS — R45851 Suicidal ideations: Secondary | ICD-10-CM

## 2016-06-18 DIAGNOSIS — F17210 Nicotine dependence, cigarettes, uncomplicated: Secondary | ICD-10-CM | POA: Diagnosis not present

## 2016-06-18 DIAGNOSIS — X781XXA Intentional self-harm by knife, initial encounter: Secondary | ICD-10-CM | POA: Insufficient documentation

## 2016-06-18 HISTORY — DX: Anxiety disorder, unspecified: F41.9

## 2016-06-18 HISTORY — DX: Depression, unspecified: F32.A

## 2016-06-18 HISTORY — DX: Major depressive disorder, single episode, unspecified: F32.9

## 2016-06-18 LAB — CBC
HEMATOCRIT: 41.4 % (ref 36.0–46.0)
Hemoglobin: 14.6 g/dL (ref 12.0–15.0)
MCH: 32.4 pg (ref 26.0–34.0)
MCHC: 35.3 g/dL (ref 30.0–36.0)
MCV: 92 fL (ref 78.0–100.0)
PLATELETS: 263 10*3/uL (ref 150–400)
RBC: 4.5 MIL/uL (ref 3.87–5.11)
RDW: 12.5 % (ref 11.5–15.5)
WBC: 6.9 10*3/uL (ref 4.0–10.5)

## 2016-06-18 LAB — COMPREHENSIVE METABOLIC PANEL
ALK PHOS: 74 U/L (ref 38–126)
ALT: 18 U/L (ref 14–54)
AST: 20 U/L (ref 15–41)
Albumin: 4.1 g/dL (ref 3.5–5.0)
Anion gap: 9 (ref 5–15)
BUN: 11 mg/dL (ref 6–20)
CALCIUM: 9.1 mg/dL (ref 8.9–10.3)
CO2: 25 mmol/L (ref 22–32)
CREATININE: 0.8 mg/dL (ref 0.44–1.00)
Chloride: 105 mmol/L (ref 101–111)
GFR calc Af Amer: >60 mL/min (ref >60)
Glucose, Bld: 171 mg/dL — ABNORMAL HIGH (ref 65–99)
POTASSIUM: 2.9 mmol/L — AB (ref 3.5–5.1)
Sodium: 139 mmol/L (ref 135–145)
TOTAL PROTEIN: 7.2 g/dL (ref 6.5–8.1)
Total Bilirubin: 0.6 mg/dL (ref 0.3–1.2)

## 2016-06-18 LAB — RAPID URINE DRUG SCREEN, HOSP PERFORMED
Amphetamines: NOT DETECTED
BARBITURATES: NOT DETECTED
Benzodiazepines: POSITIVE — AB
Cocaine: POSITIVE — AB
Opiates: NOT DETECTED
Tetrahydrocannabinol: POSITIVE — AB

## 2016-06-18 LAB — ETHANOL

## 2016-06-18 LAB — ACETAMINOPHEN LEVEL: Acetaminophen (Tylenol), Serum: <10 ug/mL — ABNORMAL LOW (ref 10–30)

## 2016-06-18 LAB — SALICYLATE LEVEL: Salicylate Lvl: <7 mg/dL (ref 2.8–30.0)

## 2016-06-18 LAB — POC URINE PREG, ED: PREG TEST UR: NEGATIVE

## 2016-06-18 MED ORDER — LORAZEPAM 1 MG PO TABS
1.0000 mg | ORAL_TABLET | Freq: Once | ORAL | Status: AC
  Administered 2016-06-18: 1 mg via ORAL
  Filled 2016-06-18: qty 1

## 2016-06-18 MED ORDER — HALOPERIDOL 5 MG PO TABS
5.0000 mg | ORAL_TABLET | Freq: Once | ORAL | Status: AC
  Administered 2016-06-18: 5 mg via ORAL
  Filled 2016-06-18: qty 1

## 2016-06-18 MED ORDER — ONDANSETRON HCL 4 MG PO TABS: 4.0000 mg | ORAL_TABLET | Freq: Three times a day (TID) | ORAL | Status: DC | PRN

## 2016-06-18 MED ORDER — POTASSIUM CHLORIDE CRYS ER 20 MEQ PO TBCR
40.0000 meq | EXTENDED_RELEASE_TABLET | Freq: Once | ORAL | Status: AC
  Administered 2016-06-18: 40 meq via ORAL
  Filled 2016-06-18: qty 2

## 2016-06-18 MED ORDER — ZOLPIDEM TARTRATE 5 MG PO TABS
5.0000 mg | ORAL_TABLET | Freq: Every evening | ORAL | Status: DC | PRN
  Administered 2016-06-18: 5 mg via ORAL
  Filled 2016-06-18: qty 1

## 2016-06-18 MED ORDER — ACETAMINOPHEN 325 MG PO TABS: 650.0000 mg | ORAL_TABLET | ORAL | Status: DC | PRN

## 2016-06-18 MED ORDER — IBUPROFEN 200 MG PO TABS: 600.0000 mg | ORAL_TABLET | Freq: Three times a day (TID) | ORAL | Status: DC | PRN

## 2016-06-18 NOTE — ED Notes (Signed)
Bed: WA29 Expected date:  Expected time:  Means of arrival:  Comments: 
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
## 2016-06-18 NOTE — BH Assessment (Signed)
Pt filled out walk in form and indicated that she was suicidal , homicidal and also reported that her concern for coming in today was a '' suicide attempt' the patient left from lobby after waiting 10 minutes without being seen. Unable to get patient to return for assessment. 911 was called for wellness check due to above. Address provided per Walk in form. Description provided per staff who saw patient. [employer] to be dispatched per 911 Dispatch operator.

## 2016-06-18 NOTE — BHH Counselor (Addendum)
Pt signed a release form for her sisters. Release form provided to pt's nurse. [name] requested a copy of the pt's release from. Clinican provide a copy of the signed release form. [name] requested the pt be placed at a local inpatient facility because the the pt's family would not be able to visit [place]. [name] reported, the pt would not do well if her family could not visit. Clinician discussed crisis intervention and the pt being admitted to [facility] is not guaranteed for the pt. Per RN the pt made a password Eliberto Ivory([place]) to be used when someone [facility] to [facility], to speak to her.   *Pt's sisters want to be updated on any changes and placements of the pt.Royston Sinner*  Chaka Walker [phone]) and Sue LushAndrea [phone]).  Gwinda Passereylese D Bennett, MS, [facility], [facility] Triage Specialist [phone]

## 2016-06-18 NOTE — ED Notes (Signed)
Bed: [facility] Expected date:  Expected time:  Means of arrival:  Comments: 

## 2016-06-18 NOTE — ED Triage Notes (Signed)
States today cut wrist with razor with hx of trying to kill herself in past put on new medications for depression flat effect noted in triage.

## 2016-06-18 NOTE — ED Provider Notes (Signed)
Nancy Henson   Nancy Henson Arrival date & time: 06/18/16  1951     History   Chief Complaint Chief Complaint  Patient presents with  . Suicidal    HPI Nancy Henson is a 21 y.o. female.  The history is provided by the patient and medical records. No language interpreter was used.  Mental Health Problem  Presenting symptoms: agitation, depression, hallucinations, homicidal ideas, self-mutilation, suicidal thoughts, suicidal threats and suicide attempt   Patient accompanied by:  Family member Degree of incapacity (severity):  Severe Onset quality:  Gradual Duration:  1 month Timing:  Constant Progression:  Worsening Chronicity:  Recurrent Context: noncompliance   Treatment compliance:  Untreated Relieved by:  Nothing Worsened by:  Nothing Ineffective treatments:  None tried Associated symptoms: anxiety   Associated symptoms: no abdominal pain, no chest pain, no fatigue and no headaches   Risk factors: hx of mental illness and hx of suicide attempts     Past Medical History:  Diagnosis Date  . Anemia   . Anxiety   . Depression   . Headache   . No pertinent past medical history   . Pregnancy     Patient Active Problem List   Diagnosis Date Noted  . Migraine headache without aura 11/16/2014  . Nexplanon in place 11/16/2014    Past Surgical History:  Procedure Laterality Date  . NO PAST SURGERIES      OB History    Gravida Para Term Preterm AB Living   3 3 3     3    SAB TAB Ectopic Multiple Live Births         0 3       Home Medications    Prior to Admission medications   Medication Sig Start Date End Date Taking? Authorizing Provider  etonogestrel (NEXPLANON) 68 MG IMPL implant 1 each by Subdermal route once.    [provider]    Family History Family History  Problem Relation Age of Onset  . Alcohol abuse Neg Hx   . Arthritis Neg Hx   . Asthma Neg Hx   . Birth defects Neg Hx   . Cancer Neg Hx   .  COPD Neg Hx   . Depression Neg Hx   . Diabetes Neg Hx   . Drug abuse Neg Hx   . Early death Neg Hx   . Hearing loss Neg Hx   . Heart disease Neg Hx   . Hyperlipidemia Neg Hx   . Hypertension Neg Hx   . Kidney disease Neg Hx   . Learning disabilities Neg Hx   . Mental illness Neg Hx   . Mental retardation Neg Hx   . Miscarriages / Stillbirths Neg Hx   . Stroke Neg Hx   . Vision loss Neg Hx   . Varicose Veins Neg Hx     Social History Social History  Substance Use Topics  . Smoking status: Current Every Day Smoker    Packs/day: 0.50    Types: Cigarettes  . Smokeless tobacco: Never Used  . Alcohol use No     Allergies   Patient has no known allergies.   Review of Systems Review of Systems  Constitutional: Negative for activity change, chills, diaphoresis, fatigue and fever.  HENT: Negative for congestion and rhinorrhea.   Eyes: Negative for visual disturbance.  Respiratory: Negative for cough, chest tightness, shortness of breath and stridor.   Cardiovascular: Negative for chest pain, palpitations and leg swelling.  Gastrointestinal: Negative for abdominal distention, abdominal pain, constipation, diarrhea, nausea and vomiting.  Genitourinary: Negative for difficulty urinating, dysuria, flank pain, frequency, hematuria, menstrual problem, pelvic pain, vaginal bleeding and vaginal discharge.  Musculoskeletal: Negative for back pain and neck pain.  Skin: Positive for wound. Negative for rash.  Neurological: Negative for dizziness, seizures, weakness, light-headedness, numbness and headaches.  Psychiatric/Behavioral: Positive for agitation, hallucinations, homicidal ideas, self-injury and suicidal ideas. Negative for confusion. The patient is nervous/anxious.   All other systems reviewed and are negative.    Physical Exam Updated Vital Signs BP (!) 137/98 (BP Location: Left Arm)   Pulse 71   Temp 97.9 F (36.6 C) (Oral)   Resp 16   Ht 5\' 4"  (1.626 m)   Wt 63.5 kg  (140 lb)   SpO2 100%   BMI 24.03 kg/m   Physical Exam  Constitutional: She appears well-developed and well-nourished. No distress.  HENT:  Head: Normocephalic and atraumatic.  Mouth/Throat: Oropharynx is clear and moist. No oropharyngeal exudate.  Eyes: Conjunctivae and EOM are normal. Pupils are equal, round, and reactive to light.  Neck: Normal range of motion. Neck supple.  Cardiovascular: Normal rate and regular rhythm.   No murmur heard. Pulmonary/Chest: Effort normal and breath sounds normal. No stridor. No respiratory distress. She has no wheezes. She has no rales. She exhibits no tenderness.  Abdominal: Soft. There is no tenderness.  Musculoskeletal: She exhibits tenderness. She exhibits no edema.       Right wrist: She exhibits laceration.       Left wrist: She exhibits laceration.  Multiple superficial lacerations on her bilateral wrists and forearms. All are hemostatic and well approximated. Patient has normal pulses in both arms. Normal capillary refill and grip strength. Normal sensation in fingers. Patient also has small hemostatic laceration at the distal tip of her right index finger.  Patient reports she is up-to-date with tetanus vaccination.  Neurological: She is alert. No sensory deficit. She exhibits normal muscle tone.  Skin: Skin is warm and dry. She is not diaphoretic.  Psychiatric: She is agitated and actively hallucinating. She exhibits a depressed mood. She expresses homicidal and suicidal ideation. She expresses suicidal plans and homicidal plans.  Nursing Henson and vitals reviewed.    ED Treatments / Results  Labs (all labs ordered are listed, but only abnormal results are displayed) Labs Reviewed  COMPREHENSIVE METABOLIC PANEL - Abnormal; Notable for the following:       Result Value   Potassium 2.9 (*)    Glucose, Bld 171 (*)    All other components within normal limits  ACETAMINOPHEN LEVEL - Abnormal; Notable for the following:    Acetaminophen  (Tylenol), Serum <10 (*)    All other components within normal limits  RAPID URINE DRUG SCREEN, HOSP PERFORMED - Abnormal; Notable for the following:    Cocaine POSITIVE (*)    Benzodiazepines POSITIVE (*)    Tetrahydrocannabinol POSITIVE (*)    All other components within normal limits  ETHANOL  SALICYLATE LEVEL  CBC  POC URINE PREG, ED  I-STAT BETA HCG BLOOD, ED ([facility], [facility], [facility] ONLY)    EKG  EKG Interpretation None       Radiology No results found.  Procedures Procedures (including critical care time)  Medications Ordered in ED Medications  zolpidem (AMBIEN) tablet 5 mg (5 mg Oral Given 06/18/16 2336)  ondansetron (ZOFRAN) tablet 4 mg (not administered)  acetaminophen (TYLENOL) tablet 650 mg (not administered)  ibuprofen (ADVIL,MOTRIN) tablet 600 mg (not administered)  LORazepam (ATIVAN) tablet 1 mg (1 mg Oral Given 06/18/16 2135)  potassium chloride SA (K-DUR,KLOR-CON) CR tablet 40 mEq (40 mEq Oral Given 06/18/16 2209)  haloperidol (HALDOL) tablet 5 mg (5 mg Oral Given 06/18/16 2235)     Initial Impression / Assessment and Plan / ED Course  I have reviewed the triage vital signs and the nursing notes.  Pertinent labs & imaging results that were available during my care of the patient were reviewed by me and considered in my medical decision making (see chart for details).     Nancy Henson is a 21 y.o. female with a past medical history significant for depression and anxiety who presents with suicidal ideation, homicidal ideation, hallucinations, and suicide attempt tonight. Patient reports that for the last several weeks, she has had worsened thoughts. She says that she has been having suicidal ideations. She says that a month ago, she had 55 of pain pills sitting on her bed as well as alcohol which she was going to take to overdose and kill herself. She says that she was hearing voices telling her to "do it". She says that when she was getting ready to take the  medicine, she snapped out of it and stopped. Patient says that she decided to kill result tonight when she was getting stressed out about her crying children. She went upstairs and grabbed a knife which was too dull to cut her left wrist. She tried multiple times. She then went and took a razor blade out of her razor and slit both wrists. The wrist slits were very superficial and are hemostatic on arrival. She says that she still wants to kill herself. She has a plan to either shoot herself with a gun or hang herself. She says she does not have access to a firearm at this time.  Patient also reports that she is having homicidal ideation. She says that she does not want to hurt her children but she wants to hurt other people. She says that she gets the thought of stabbing people just to "see how it feels". She says that she is having some auditory hallucinations telling her to do all of these things.  Patient denies any physical complaints at this time and says that she is not having significant pain in her wounds. She reports that she is up-to-date with her tetanus shot.  On exam, patient had several superficial lacerations on her bilateral wrists. Patient also had a small laceration on the distal tip of her right index finger. All wounds are hemostatic and well approximated. Bacitracin order will be placed for women's. Patient up-to-date on tetanus.  Patient will have screening laboratory testing and TTS consult will be placed. Patient was placed under IVC by me after suicide attempt and reported SI and HI with a plan to do both.  Laboratory testing showed hypokalemia. Oral potassium supplementation was ordered. Other laboratory testing showed positive urinalysis for cocaine, benzodiazepines, and marijuana.   Patient was agitated when she was informed that she was placed under involuntary commitment. Patient understands that she is a danger to herself and others at this time. Patient agreed to take oral  medications. Patient given oral Ativan and oral Haldol. Patient had mild improvement in agitation. Patient reports that she "just wants to sleep". Patient will be given a dose of Ambien to try and allow her to sleep.  TTS team recommended inpatient management. Anticipate following up on their final recommendations for inpatient management.   Final Clinical Impressions(s) /  ED Diagnoses   Final diagnoses:  Suicidal ideation  Suicide attempt [facility])  Homicidal ideation  History of command hallucinations  Involuntary commitment    Clinical Impression: 1. Suicidal ideation   2. Suicide attempt (HCC)   3. Homicidal ideation   4. History of command hallucinations   5. Involuntary commitment     Disposition: Awaiting final psychiatric disposition.    Tzipporah Nagorski, Canary Brim, MD 06/19/16 [phone]

## 2016-06-18 NOTE — ED Notes (Signed)
Password established Nancy Henson([place])

## 2016-06-18 NOTE — ED Notes (Signed)
Bed: WA30 Expected date:  Expected time:  Means of arrival:  Comments: 

## 2016-06-19 ENCOUNTER — Encounter (HOSPITAL_COMMUNITY): Payer: Self-pay | Admitting: *Deleted

## 2016-06-19 ENCOUNTER — Inpatient Hospital Stay (HOSPITAL_COMMUNITY)
Admission: AD | Admit: 2016-06-19 | Discharge: 2016-06-23 | DRG: 885 | Disposition: A | Discharge status: Home / Self Care | Payer: Medicaid Other | Attending: Psychiatry | Admitting: Psychiatry

## 2016-06-19 DIAGNOSIS — F419 Anxiety disorder, unspecified: Secondary | ICD-10-CM | POA: Diagnosis present

## 2016-06-19 DIAGNOSIS — R45851 Suicidal ideations: Secondary | ICD-10-CM

## 2016-06-19 DIAGNOSIS — F17210 Nicotine dependence, cigarettes, uncomplicated: Secondary | ICD-10-CM | POA: Diagnosis present

## 2016-06-19 DIAGNOSIS — Z813 Family history of other psychoactive substance abuse and dependence: Secondary | ICD-10-CM | POA: Diagnosis not present

## 2016-06-19 DIAGNOSIS — G4700 Insomnia, unspecified: Secondary | ICD-10-CM | POA: Diagnosis present

## 2016-06-19 DIAGNOSIS — Z79899 Other long term (current) drug therapy: Secondary | ICD-10-CM | POA: Diagnosis not present

## 2016-06-19 DIAGNOSIS — S61519A Laceration without foreign body of unspecified wrist, initial encounter: Secondary | ICD-10-CM | POA: Diagnosis present

## 2016-06-19 DIAGNOSIS — T1491XA Suicide attempt, initial encounter: Secondary | ICD-10-CM | POA: Diagnosis not present

## 2016-06-19 DIAGNOSIS — R634 Abnormal weight loss: Secondary | ICD-10-CM | POA: Diagnosis not present

## 2016-06-19 DIAGNOSIS — X789XXA Intentional self-harm by unspecified sharp object, initial encounter: Secondary | ICD-10-CM | POA: Diagnosis present

## 2016-06-19 DIAGNOSIS — R451 Restlessness and agitation: Secondary | ICD-10-CM | POA: Diagnosis present

## 2016-06-19 DIAGNOSIS — F4310 Post-traumatic stress disorder, unspecified: Secondary | ICD-10-CM | POA: Diagnosis present

## 2016-06-19 DIAGNOSIS — F332 Major depressive disorder, recurrent severe without psychotic features: Principal | ICD-10-CM | POA: Diagnosis present

## 2016-06-19 DIAGNOSIS — F1410 Cocaine abuse, uncomplicated: Secondary | ICD-10-CM | POA: Diagnosis present

## 2016-06-19 DIAGNOSIS — Z811 Family history of alcohol abuse and dependence: Secondary | ICD-10-CM | POA: Diagnosis not present

## 2016-06-19 DIAGNOSIS — R51 Headache: Secondary | ICD-10-CM | POA: Diagnosis not present

## 2016-06-19 DIAGNOSIS — S61511A Laceration without foreign body of right wrist, initial encounter: Secondary | ICD-10-CM | POA: Diagnosis not present

## 2016-06-19 DIAGNOSIS — F1910 Other psychoactive substance abuse, uncomplicated: Secondary | ICD-10-CM | POA: Diagnosis not present

## 2016-06-19 LAB — I-STAT BETA HCG BLOOD, ED (MC, WL, AP ONLY): I-stat hCG, quantitative: <5 m[IU]/mL (ref <5)

## 2016-06-19 MED ORDER — TRAZODONE HCL 50 MG PO TABS
50.0000 mg | ORAL_TABLET | Freq: Every evening | ORAL | Status: DC | PRN
  Administered 2016-06-19 (×2): 50 mg via ORAL
  Filled 2016-06-19 (×7): qty 1

## 2016-06-19 MED ORDER — MAGNESIUM HYDROXIDE 400 MG/5ML PO SUSP: 30.0000 mL | Freq: Every day | ORAL | Status: DC | PRN

## 2016-06-19 MED ORDER — NICOTINE 21 MG/24HR TD PT24
21.0000 mg | MEDICATED_PATCH | Freq: Every day | TRANSDERMAL | Status: DC
  Administered 2016-06-19 – 2016-06-20 (×2): 21 mg via TRANSDERMAL
  Filled 2016-06-19 (×3): qty 1

## 2016-06-19 MED ORDER — IBUPROFEN 600 MG PO TABS: 600.0000 mg | ORAL_TABLET | Freq: Three times a day (TID) | ORAL | Status: DC | PRN

## 2016-06-19 MED ORDER — ALUM & MAG HYDROXIDE-SIMETH 200-200-20 MG/5ML PO SUSP: 30.0000 mL | ORAL | Status: DC | PRN

## 2016-06-19 MED ORDER — CITALOPRAM HYDROBROMIDE 20 MG PO TABS
20.0000 mg | ORAL_TABLET | Freq: Every day | ORAL | Status: DC
  Administered 2016-06-19 – 2016-06-20 (×2): 20 mg via ORAL
  Filled 2016-06-19 (×5): qty 1

## 2016-06-19 NOTE — Progress Notes (Signed)
  DATA ACTION RESPONSE  Objective- Pt. is visible in the dayroom, seen interacting with peers. Presents with a anxious/irritable affect and mood. Pt. states she is hoping to be d/c soon in order to meet deadline in bills and to take care of kids. No abnormal s/s.  Subjective- Denies having any SI/HI/AVH/Pain at this time. Is cooperative and remain safe on the unit.  1:1 interaction in private to establish rapport. Encouragement, education, & support given from staff.   Safety maintained with Q 15 checks. Continue with POC.

## 2016-06-19 NOTE — Plan of Care (Signed)
Problem: Safety: Goal: Periods of time without injury will increase Outcome: Progressing Pt. remains a low fall risk, denies SI/HI/AVH at this time, Q 15 checks in effect.    

## 2016-06-19 NOTE — Social Work (Signed)
Referred to [facility], is [place] resident.  Anne Cunningham, LCSW Lead Clinical Social Worker Phone:  [phone]  

## 2016-06-19 NOTE — Progress Notes (Signed)
Pt attend wrap up group. Her day was a 6. Her goal start to feel better. She need to be at a place to get help while she's here and once she leave. She wants to feel safe and be able to cope.
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
## 2016-06-19 NOTE — BH Assessment (Signed)
[facility] Assessment Progress Note  Per Thedore MinsMojeed Akintayo, MD, this pt requires psychiatric hospitalization.  Nancy Heinrichina Tate, RN, [facility].  Pt presents under IVC initiated by Lynden Oxfordhristopher Tegeler, MD, and [facility].  Pt's nurse has been notified, and agrees to call report to [phone].  Pt is to be transported via [employer].   Nancy Canninghomas Miosotis Wetsel, MA Triage Specialist [phone]

## 2016-06-19 NOTE — Progress Notes (Signed)
Patient ID: [name], female   DOB: [birth year], 21 y.o.   MRN: [id number] NSG Admit Note: 21 yo female admitted to Dr.Cobos services on the Adult 300 hall unit for further evaluation and treatment of a possible mood disorder with SI and polysubstance abuse. [facility] with [employer] providing transport. Pt had made several superficial cuts to both wrists which required nothing more than bandages. Stated that she believes that she is suffering from untreated post-partum depression. Pt says that she was very frustrated and tired with her 3 children (4,2 and 1 yr old) that she lost control and cut her wrists in a suicide attempt. Also says that she had thought of a plan to overdose or hang herself as well. Her UDS was positive for Cocaine,Marijauna and Benzodiazapine. Pt is oriented to the unit and handbook given. No complaints of pain or problems at this time.

## 2016-06-20 DIAGNOSIS — F1410 Cocaine abuse, uncomplicated: Secondary | ICD-10-CM

## 2016-06-20 DIAGNOSIS — R51 Headache: Secondary | ICD-10-CM

## 2016-06-20 DIAGNOSIS — F1910 Other psychoactive substance abuse, uncomplicated: Secondary | ICD-10-CM

## 2016-06-20 DIAGNOSIS — F17210 Nicotine dependence, cigarettes, uncomplicated: Secondary | ICD-10-CM

## 2016-06-20 DIAGNOSIS — F419 Anxiety disorder, unspecified: Secondary | ICD-10-CM

## 2016-06-20 DIAGNOSIS — R634 Abnormal weight loss: Secondary | ICD-10-CM

## 2016-06-20 DIAGNOSIS — F332 Major depressive disorder, recurrent severe without psychotic features: Principal | ICD-10-CM

## 2016-06-20 DIAGNOSIS — Z811 Family history of alcohol abuse and dependence: Secondary | ICD-10-CM

## 2016-06-20 DIAGNOSIS — Z813 Family history of other psychoactive substance abuse and dependence: Secondary | ICD-10-CM

## 2016-06-20 LAB — BASIC METABOLIC PANEL
ANION GAP: 8 (ref 5–15)
BUN: 10 mg/dL (ref 6–20)
CO2: 28 mmol/L (ref 22–32)
Calcium: 9.3 mg/dL (ref 8.9–10.3)
Chloride: 105 mmol/L (ref 101–111)
Creatinine, Ser: 0.78 mg/dL (ref 0.44–1.00)
GFR calc non Af Amer: >60 mL/min (ref >60)
Glucose, Bld: 112 mg/dL — ABNORMAL HIGH (ref 65–99)
Potassium: 3.9 mmol/L (ref 3.5–5.1)
Sodium: 141 mmol/L (ref 135–145)

## 2016-06-20 LAB — TSH: TSH: 3.111 u[IU]/mL (ref 0.350–4.500)

## 2016-06-20 MED ORDER — TRAZODONE HCL 100 MG PO TABS
100.0000 mg | ORAL_TABLET | Freq: Every evening | ORAL | Status: DC | PRN
Start: 2016-06-20 — End: 2016-06-23
  Administered 2016-06-20 – 2016-06-22 (×3): 100 mg via ORAL
  Filled 2016-06-20 (×2): qty 1

## 2016-06-20 MED ORDER — LORAZEPAM 1 MG PO TABS
1.0000 mg | ORAL_TABLET | Freq: Four times a day (QID) | ORAL | Status: DC | PRN
  Administered 2016-06-20 – 2016-06-22 (×5): 1 mg via ORAL
  Filled 2016-06-20 (×5): qty 1

## 2016-06-20 MED ORDER — SERTRALINE HCL 50 MG PO TABS
50.0000 mg | ORAL_TABLET | Freq: Every day | ORAL | Status: DC
  Administered 2016-06-21 – 2016-06-23 (×3): 50 mg via ORAL
  Filled 2016-06-20 (×5): qty 1

## 2016-06-20 MED ORDER — NICOTINE POLACRILEX 2 MG MT GUM
2.0000 mg | CHEWING_GUM | OROMUCOSAL | Status: DC | PRN
  Administered 2016-06-20: 2 mg via ORAL

## 2016-06-20 MED ORDER — HYDROXYZINE HCL 25 MG PO TABS
25.0000 mg | ORAL_TABLET | Freq: Four times a day (QID) | ORAL | Status: DC | PRN
Start: 2016-06-20 — End: 2016-06-23
  Administered 2016-06-20 – 2016-06-22 (×4): 25 mg via ORAL
  Filled 2016-06-20 (×4): qty 1

## 2016-06-20 MED ORDER — OLANZAPINE 5 MG PO TBDP
5.0000 mg | ORAL_TABLET | Freq: Two times a day (BID) | ORAL | Status: DC | PRN
  Administered 2016-06-20 – 2016-06-22 (×3): 5 mg via ORAL
  Filled 2016-06-20 (×3): qty 1

## 2016-06-20 NOTE — BHH Suicide Risk Assessment (Signed)
[facility] INPATIENT:  Family/Significant Other Suicide Prevention Education  Suicide Prevention Education:  Education Completed; Nancy Henson, [name] [phone], has been identified by the patient as the family member/significant other with whom the patient will be residing, and identified as the person(s) who will aid the patient in the event of a mental health crisis (suicidal ideations/suicide attempt).  With written consent from the patient, the family member/significant other has been provided the following suicide prevention education, prior to the and/or following the discharge of the patient.  The suicide prevention education provided includes the following:  Suicide risk factors  Suicide prevention and interventions  National Suicide Hotline telephone number  [facility] assessment telephone number  [facility] Emergency Assistance 911  [facility] and/or Residential Mobile Crisis Unit telephone number  Request made of family/significant other to:  Remove weapons (e.g., guns, rifles, knives), all items previously/currently identified as safety concern.    Remove drugs/medications (over-the-counter, prescriptions, illicit drugs), all items previously/currently identified as a safety concern.  The family member/significant other verbalizes understanding of the suicide prevention education information provided.  The family member/significant other agrees to remove the items of safety concern listed above.  [name] expresses support for Pt and is concerned that the antidepressant that Pt started in the community may be a cause of the suicidal ideation. Per sister Pt is overwhelmed at home and is hopeful to get a childcare voucher soon.   Nancy LennertLauren C Adrieanna Henson 06/20/2016, 2:12 PM

## 2016-06-20 NOTE — Progress Notes (Signed)
D: Spoke with patient 1:1 throughout the day who has been up and visible on the unit. Patient's affect has been flat, sad with depressed and anxious mood. She rated her depression at a 6/10, hopelessness at a 5/10 and anxiety at a 6/10. Rates sleep as poor, appetite as fair, energy as normal and concentration as good. States goal for today was to "start feeling hope and realize and accept I need help." Also, "be positive." Denied pain, physical problems. Patient came to this writer at 1830, tearful and expressing agitation and anger. Clenched fists. "I don't know what it is. I just feel so angry. I tried the vistaril. It didn't help. I'm trying to play cards but it's not working. This is how I am all the time. I go from crying to angry to sad. I'm all over the place."  A: Notified Dr. Jama Flavorsobos of change in patient's status. Order received for prn ativan which was given. Processed with patient regarding anger, lability in mood. Reassurance given and coping skills encouraged. Scheduled meds given as well without difficulty. Self inventory reviewed. Encouraged completion of Suicide Safety Plan as mood begins to stabilize. Discussed POC with MD, SW during tx team.  R: Patient verbalizes understanding of POC. Will ask night RN to reassess ativan as patient was medicated at 1845. Patient denies SI/HI and remains safe on level III obs. Continue to monitor closely.

## 2016-06-20 NOTE — [facility] Group Notes (Signed)
[facility] Group Notes:  (Nursing/MHT/Case Management/Adjunct)  Date:  06/20/2016  Time:  0900  Type of Therapy:  Nurse Education - Successful Goal Setting  Participation Level:  Did Not Attend  Participation Quality:    Affect:    Cognitive:    Insight:    Engagement in Group:    Modes of Intervention:    Summary of Progress/Problems: Patient invited however elected to remain in bed.  Merian CapronFriedman, Jalaine Riggenbach [facility] 06/20/2016, 0930

## 2016-06-20 NOTE — [facility] Suicide Risk Assessment (Signed)
[facility]   Nursing information obtained from:  Patient Demographic factors:  Adolescent or young adult Current Mental Status:  Suicidal ideation indicated by patient, Suicidal ideation indicated by others, Suicide plan, Self-harm thoughts, Self-harm behaviors, Belief that plan would result in death Loss Factors:    Historical Factors:  Impulsivity Risk Reduction Factors:    resilience   Total Time spent with patient: 45 minutes Principal Problem:  MDD Diagnosis:  MDD    Continued Clinical Symptoms:  Alcohol Use Disorder Identification Test Final Score (AUDIT): 3 The "Alcohol Use Disorders Identification Test", Guidelines for Use in Primary Care, Second Edition.  World Science writerHealth Organization [facility](WHO). Score between 0-7:  no or low risk or alcohol related problems. Score between 8-15:  moderate risk of alcohol related problems. Score between 16-19:  high risk of alcohol related problems. Score 20 or above:  warrants further diagnostic evaluation for alcohol dependence and treatment.   CLINICAL FACTORS:  21 year old female, reports history of depression, with recent suicidal ideations of overdosing . She recently engaged in self cutting , and has several superficial lacerations on wrist area.    Psychiatric Specialty Exam: Physical Exam  ROS  Blood pressure 106/80, pulse (!) 118, temperature 98.7 F (37.1 C), temperature source Oral, resp. rate 16, height 5\' 4"  (1.626 m), weight 58.5 kg (129 lb), not currently breastfeeding.Body mass index is 22.14 kg/m.  See admit note MSE   COGNITIVE FEATURES THAT CONTRIBUTE TO RISK:  Closed-mindedness and Loss of executive function    SUICIDE RISK:   Moderate:  Frequent suicidal ideation with limited intensity, and duration, some specificity in terms of plans, no associated intent, good self-control, limited dysphoria/symptomatology, some risk factors present, and identifiable protective factors, including available and  accessible social support.  PLAN OF CARE: Patient will be admitted to inpatient psychiatric unit for stabilization and safety. Will provide and encourage milieu participation. Provide medication management and maked adjustments as needed.  Will follow daily.    I certify that inpatient services furnished can reasonably be expected to improve the patient's condition.   Craige CottaFernando A Cobos, MD 06/20/2016, 3:11 PM

## 2016-06-20 NOTE — Progress Notes (Signed)
Recreation Therapy Notes  Animal-Assisted Activity (AAA) Program Checklist/Progress Notes Patient Eligibility Criteria Checklist & Daily Group note for Rec TxIntervention  Date: 06/20/2016 Time: 2:55pm Location: 400 hall dayroom  AAA/T Program Assumption of Risk Form signed by Patient/ or Parent Legal Guardian Yes  Patient is free of allergies or sever asthma Yes  Patient reports no fear of animals Yes  Patient reports no history of cruelty to animals Yes  Patient understands his/her participation is voluntary Yes  Patient washes hands before animal contact Yes  Patient washes hands after animal contact Yes  Behavioral Response: Patient did not attend.  Jewelz Kobus, Recreation Therapy Intern 
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
## 2016-06-20 NOTE — Progress Notes (Signed)
D    Pt is a irritable and anxious   She did attend group this evening    She has been interacting with others   She was observed in the dayroom sitting in a chair with her legs spread and propped up talking to a female patient   Staff redirected her to a more appropriate position   Her reaction was to roll her eyes  A    Verbal support given   Medications administered and effectiveness monitored    Q 15 min checks   Will continue to monitor for inappropriate behaviors R   Pt is safe at present
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
## 2016-06-20 NOTE — Plan of Care (Signed)
Problem: Education: Goal: Verbalization of understanding the information provided will improve Outcome: Progressing Patient verbalizes understanding of education, information provided.   Problem: Medication: Goal: Compliance with prescribed medication regimen will improve Outcome: Progressing Patient has been med compliant.

## 2016-06-20 NOTE — Tx Team (Signed)
Interdisciplinary Treatment and Diagnostic Plan Update  06/20/2016 Time of Session: 9:30am Nancy Henson MRN: [id number]  Principal Diagnosis: MDD, no psychotic features  Secondary Diagnoses: Active Problems:   Major depressive disorder, recurrent severe without psychotic features ([place])   Current Medications:  Current Facility-Administered Medications  Medication Dose Route Frequency Provider Last Rate Last Dose  . alum & mag hydroxide-simeth (MAALOX/MYLANTA) 200-200-20 MG/5ML suspension 30 mL  30 mL Oral Q4H PRN Patrecia Pour, NP      . citalopram (CELEXA) tablet 20 mg  20 mg Oral Daily Patrecia Pour, NP   20 mg at 06/20/16 0844  . ibuprofen (ADVIL,MOTRIN) tablet 600 mg  600 mg Oral Q8H PRN Patrecia Pour, NP      . magnesium hydroxide (MILK OF MAGNESIA) suspension 30 mL  30 mL Oral Daily PRN Patrecia Pour, NP      . nicotine (NICODERM CQ - dosed in mg/24 hours) patch 21 mg  21 mg Transdermal Daily Derrill Center, NP   21 mg at 06/20/16 0844  . traZODone (DESYREL) tablet 50 mg  50 mg Oral QHS,MR X 1 Derrill Center, NP   50 mg at 06/19/16 2159    PTA Medications: Prescriptions Prior to Admission  Medication Sig Dispense Refill Last Dose  . citalopram (CELEXA) 20 MG tablet Take 20 mg by mouth daily.  0 06/18/2016 at Unknown time  . etonogestrel (NEXPLANON) 68 MG IMPL implant 1 each by Subdermal route once.   10/11/2014 at unknown    Treatment Modalities: Medication Management, Group therapy, Case management,  1 to 1 session with clinician, Psychoeducation, Recreational therapy.  Patient Stressors: Marital or family conflict Substance abuse  Patient Strengths: Ability for insight General fund of knowledge Physical Health Supportive family/friends  Physician Treatment Plan for Primary Diagnosis: MDD, no psychotic features Long Term Goal(s): Improvement in symptoms so as ready for discharge  Short Term Goals: Ability to verbalize feelings will improve Ability to  disclose and discuss suicidal ideas Ability to demonstrate self-control will improve Ability to identify and develop effective coping behaviors will improve Ability to maintain clinical measurements within normal limits will improve Compliance with prescribed medications will improve Ability to verbalize feelings will improve Ability to disclose and discuss suicidal ideas Ability to demonstrate self-control will improve Ability to identify and develop effective coping behaviors will improve Ability to maintain clinical measurements within normal limits will improve Compliance with prescribed medications will improve  Medication Management: Evaluate patient's response, side effects, and tolerance of medication regimen.  Therapeutic Interventions: 1 to 1 sessions, Unit Group sessions and Medication administration.  Evaluation of Outcomes: Not Met  Physician Treatment Plan for Secondary Diagnosis: Active Problems:   Major depressive disorder, recurrent severe without psychotic features ([place])   Long Term Goal(s): Improvement in symptoms so as ready for discharge  Short Term Goals: Ability to verbalize feelings will improve Ability to disclose and discuss suicidal ideas Ability to demonstrate self-control will improve Ability to identify and develop effective coping behaviors will improve Ability to maintain clinical measurements within normal limits will improve Compliance with prescribed medications will improve Ability to verbalize feelings will improve Ability to disclose and discuss suicidal ideas Ability to demonstrate self-control will improve Ability to identify and develop effective coping behaviors will improve Ability to maintain clinical measurements within normal limits will improve Compliance with prescribed medications will improve  Medication Management: Evaluate patient's response, side effects, and tolerance of medication regimen.  Therapeutic Interventions: 1 to 1  sessions, Unit Group sessions and Medication administration.  Evaluation of Outcomes: Not Met   RN Treatment Plan for Primary Diagnosis: MDD, no psychotic features Long Term Goal(s): Knowledge of disease and therapeutic regimen to maintain health will improve  Short Term Goals: Ability to verbalize feelings will improve, Ability to disclose and discuss suicidal ideas and Ability to identify and develop effective coping behaviors will improve  Medication Management: RN will administer medications as ordered by provider, will assess and evaluate patient's response and provide education to patient for prescribed medication. RN will report any adverse and/or side effects to prescribing provider.  Therapeutic Interventions: 1 on 1 counseling sessions, Psychoeducation, Medication administration, Evaluate responses to treatment, Monitor vital signs and CBGs as ordered, Perform/monitor CIWA, COWS, AIMS and Fall Risk screenings as ordered, Perform wound care treatments as ordered.  Evaluation of Outcomes: Not Met   LCSW Treatment Plan for Primary Diagnosis: MDD, no psychotic features Long Term Goal(s): Safe transition to appropriate next level of care at discharge, Engage patient in therapeutic group addressing interpersonal concerns.  Short Term Goals: Engage patient in aftercare planning with referrals and resources, Identify triggers associated with mental health/substance abuse issues and Increase skills for wellness and recovery  Therapeutic Interventions: Assess for all discharge needs, 1 to 1 time with Social worker, Explore available resources and support systems, Assess for adequacy in community support network, Educate family and significant other(s) on suicide prevention, Complete Psychosocial Assessment, Interpersonal group therapy.  Evaluation of Outcomes: Not Met   Progress in Treatment: Attending groups: Pt is new to milieu, continuing to assess  Participating in groups: Pt is new  to milieu, continuing to assess  Taking medication as prescribed: Yes, MD continues to assess for medication changes as needed Toleration medication: Yes, no side effects reported at this time Family/Significant other contact made: Yes with sister Patient understands diagnosis: Continuing to assess Discussing patient identified problems/goals with staff: Yes Medical problems stabilized or resolved: Yes Denies suicidal/homicidal ideation: Yes Issues/concerns per patient self-inventory: None Other: N/A  New problem(s) identified: None identified at this time.   New Short Term/Long Term Goal(s): None identified at this time.   Discharge Plan or Barriers: Pt will return home and follow up with [facility]  Reason for Continuation of Hospitalization: Anxiety Depression Medication stabilization  Estimated Length of Stay: 2-4 days  Attendees: Patient: 06/20/2016  8:58 AM  Physician: Dr. Parke Poisson 06/20/2016  8:58 AM  Nursing: Chrys Racer RN; [place], RN 06/20/2016  8:58 AM  RN Care Manager: Lars Pinks, RN 06/20/2016  8:58 AM  Social Worker: Adriana Reams, LCSW; [place], LCSW 06/20/2016  8:58 AM  Recreational Therapist:  06/20/2016  8:58 AM  Other: Lindell Spar, NP 06/20/2016  8:58 AM  Other:  06/20/2016  8:58 AM  Other: 06/20/2016  8:58 AM    Scribe for Treatment Team: Gladstone Lighter, LCSW 06/20/2016 8:58 AM

## 2016-06-21 MED ORDER — RISPERIDONE 1 MG PO TABS
1.0000 mg | ORAL_TABLET | Freq: Two times a day (BID) | ORAL | Status: DC
  Administered 2016-06-21 – 2016-06-22 (×3): 1 mg via ORAL
  Filled 2016-06-21 (×8): qty 1

## 2016-06-21 NOTE — Progress Notes (Signed)
D: Spoke with patient 1:1 who is up and visible on unit after initially sleeping in this AM. Patient's affect sad, depressed and angry today. Mood congruent. "I feel more sad than anything today. I just don't know what's going on. I can't calm down." Requested ativan for her anxiety which was given with some relief. Rating depression at a 7/10, hopelessness at a 7/10 and anxiety at an 8/10. Rates sleep as good, appetite as fair, energy as low and concentration as poor.  States goal for today is "hopefulness" and "try to smile more." Denies pain, physical complaints. Patient returned after lunch and stated, "I feel like I can't breathe." Patient with agitated affect at that time.  A: Medicated per orders, prn zydis given for agitation, anxiety after lunch. VS obtained (see doc flowsheets). Emotional support and reassuranace provided. Continued to suggest utilization of coping skills, distractive techniques. Self inventory reviewed. Encouraged completion of Suicide Safety Plan. Discussed POC with MD, SW.   R: Patient verbalizes understanding of POC, visibly calmer after 1:1 time with this Clinical research associatewriter. On reassess, patient was asleep. Patient denies SI/HI and remains safe on level III obs. Will continue to monitor closely.

## 2016-06-21 NOTE — Progress Notes (Signed)
  DATA ACTION RESPONSE  Objective- Pt. is visible in the dayroom, seen eating a snack. Presents with a flat/irritable affect and mood. Minimal and guarded with interaction. Pt. was hoping to be d/c soon.  Subjective- Denies having any SI/HI/AVH/Pain at this time. Was cooperative and remain safe on the unit.  1:1 interaction in private to establish rapport. Encouragement, education, & support given from staff.  PRN ativan and trazodne requested and will re-eval accordingly.   Safety maintained with Q 15 checks. Continue with POC.

## 2016-06-21 NOTE — Progress Notes (Signed)
Recreation Therapy Notes  Date: 06/21/16 Time: 0930 Location: [address] Dayroom  Group Topic: Stress Management  Goal Area(s) Addresses:  Patient will verbalize importance of using healthy stress management.  Patient will identify positive emotions associated with healthy stress management.   Intervention: Stress Managemen  Activity :  LRT introduced the stress management technique of guided imagery.  LRT read a script to allow patients to engage in the activity.  Patients were to follow along as LRT read script to participate in activity.  Education:  Stress Management, Discharge Planning.   Education Outcome: Acknowledges edcuation/In group clarification offered/Needs additional education  Clinical Observations/Feedback: Pt did not attend group.   Caroll RancherMarjette Basma Buchner, LRT/CTRS         Caroll RancherLindsay, Kemal Amores A 06/21/2016 2:35 PM

## 2016-06-21 NOTE — Plan of Care (Signed)
Problem: Coping: Goal: Ability to verbalize frustrations and anger appropriately will improve Outcome: Progressing Patient continues to have periods of lability, cycling mood. Is able to come to staff when angry.  Problem: Coping: Goal: Ability to cope will improve Outcome: Not Progressing Patient continues to need   Problem: Coping: Goal: Ability to cope will improve Outcome: Not Progressing Patient continues to rely on prn medications, reluctant to utilize suggestions for coping. "nothing is working"

## 2016-06-21 NOTE — Progress Notes (Signed)
Patient came to this writer asking to speak in private. Patient angry in affect, mood with minimal eye contact. Perceives peers were "smarting off" to her. Also listed frustration with her mom who she states is "avoiding her phone call." Patient went on to say, "you all aren't helping me. I'm fine. I keep telling you all I wasn't suicidal. You have no coping skills here." Offered empathy, support and reassurance. Suggested available coping skills she could utilize and reminded her that her children need a happy and healthy mother. Patient minimally receptive. Became tearful and stated, "I don't want to talk to you guys cause you will just chart everything and keep me here longer." Reminded patient the goal is for her to be safe and for her mood to stabilize. Irving BurtonEmily RN updated on patient's current status.

## 2016-06-21 NOTE — [facility] Group Notes (Signed)
[facility] LCSW Group Therapy 06/21/2016 1:15 PM  Type of Therapy: Group Therapy- Emotion Regulation  Pt did not attend, declined invitation.   Vernie ShanksLauren Layann Bluett, LCSW 06/21/2016 3:04 PM

## 2016-06-21 NOTE — Plan of Care (Signed)
Problem: Medication: Goal: Compliance with prescribed medication regimen will improve Outcome: Progressing Pt is taking meds as prescribed.    

## 2016-06-21 NOTE — Progress Notes (Signed)
Adult Psychoeducational Group Note  Date:  06/21/2016 Time:  10:38 AM  Group Topic/Focus:  Personal Choices and Values:   The focus of this group is to help patients assess and explore the importance of values in their lives, how their values affect their decisions, how they express their values and what opposes their expression.  Participation Level:  Minimal  Participation Quality:  Inattentive  Affect:  Flat  Cognitive:  Lacking  Insight: None  Engagement in Group:  None  Modes of Intervention:  Discussion and Education  Additional Comments:  Pt was able to attend group but did not participate at all. Pt sat with her eyes closed.  Jemmie Ledgerwood E 06/21/2016, 10:38 AM

## 2016-06-21 NOTE — Progress Notes (Signed)
[facility] MD Progress Note  06/21/2016 5:52 PM Nancy Henson  MRN:  [id number]  Subjective: Nancy Henson reports, "I'm not okay. I'm feeling really down, angry & I don't know why".  Objective: 21 year old female. States she has a long history of depression but states she has been feeling worse recently and reports she had suicidal ideations with thoughts of overdosing on medications . States that 2 days ago " I came close to actually overdosing on Ibuprofen, but I decided against it". On 6/10 she states she was feeling more depressed, anxious, " crying " and impulsively cut self on wrist - has superficial lacerations, no active bleeding , did not need sutures. After this event family encouraged her to come to the hospital.   Nancy Henson is seen, chart reviewed. She is alert. Presents with flat affect. Eye contact is fair. She is lying down in her bed. Declines to go outside during recreational time with the other patients. She says she feels down, not really doing okay & feeling angry. She also says she did not know why she feels so angry. She says to leave her alone as she wants to rest. She verbally contracts for safety. She does not appear to be responding to any internal stimuli. Staff continues to provide support. Denies any medication side effects. Started on Risperdal 1 mg bid.  Principal Problem: Major depressive disorder, recurrent severe, without psychotic features.  Diagnosis:   Patient Active Problem List   Diagnosis Date Noted  . Major depressive disorder, recurrent severe without psychotic features ([place]) [F33.2] 06/19/2016  . Cocaine abuse [F14.10] 06/19/2016  . Migraine headache without aura [G43.009] 11/16/2014  . Nexplanon in place [Z97.5] 11/16/2014   Total Time spent with patient: 25 minutes  Past Psychiatric History: Mdd, severe, Cocaine use disorder.  Past Medical History:  Past Medical History:  Diagnosis Date  . Anemia   . Anxiety   . Depression   . Headache   . No pertinent  past medical history   . Pregnancy     Past Surgical History:  Procedure Laterality Date  . NO PAST SURGERIES     Family History:  Family History  Problem Relation Age of Onset  . Alcohol abuse Neg Hx   . Arthritis Neg Hx   . Asthma Neg Hx   . Birth defects Neg Hx   . Cancer Neg Hx   . COPD Neg Hx   . Depression Neg Hx   . Diabetes Neg Hx   . Drug abuse Neg Hx   . Early death Neg Hx   . Hearing loss Neg Hx   . Heart disease Neg Hx   . Hyperlipidemia Neg Hx   . Hypertension Neg Hx   . Kidney disease Neg Hx   . Learning disabilities Neg Hx   . Mental illness Neg Hx   . Mental retardation Neg Hx   . Miscarriages / Stillbirths Neg Hx   . Stroke Neg Hx   . Vision loss Neg Hx   . Varicose Veins Neg Hx    Family Psychiatric  History: See H&P.  Social History:  History  Alcohol Use  . 7.2 - 10.8 oz/week  . 12 - 18 Cans of beer per week     History  Drug Use  . Types: Cocaine, Marijuana    Social History   Social History  . Marital status: Single    Spouse name: N/A  . Number of children: N/A  . Years of education: N/A  Social History Main Topics  . Smoking status: Current Every Day Smoker    Packs/day: 2.00    Types: Cigarettes  . Smokeless tobacco: Never Used     Comment: refused  . Alcohol use 7.2 - 10.8 oz/week    12 - 18 Cans of beer per week  . Drug use: Yes    Types: Cocaine, Marijuana  . Sexual activity: Yes    Birth control/ protection: Implant     Comment: would like information on tubal ligation   Other Topics Concern  . None   Social History Narrative  . None   Additional Social History:    History of alcohol / drug use?: Yes  Sleep: Good  Appetite:  Fair  Current Medications: Current Facility-Administered Medications  Medication Dose Route Frequency Provider Last Rate Last Dose  . alum & mag hydroxide-simeth (MAALOX/MYLANTA) 200-200-20 MG/5ML suspension 30 mL  30 mL Oral Q4H PRN Patrecia Pour, NP      . hydrOXYzine  (ATARAX/VISTARIL) tablet 25 mg  25 mg Oral Q6H PRN Cobos, Myer Peer, MD   25 mg at 06/20/16 2126  . ibuprofen (ADVIL,MOTRIN) tablet 600 mg  600 mg Oral Q8H PRN Patrecia Pour, NP      . LORazepam (ATIVAN) tablet 1 mg  1 mg Oral Q6H PRN Cobos, Myer Peer, MD   1 mg at 06/21/16 1029  . magnesium hydroxide (MILK OF MAGNESIA) suspension 30 mL  30 mL Oral Daily PRN Patrecia Pour, NP      . nicotine polacrilex (NICORETTE) gum 2 mg  2 mg Oral PRN Cobos, Myer Peer, MD   2 mg at 06/20/16 1710  . OLANZapine zydis (ZYPREXA) disintegrating tablet 5 mg  5 mg Oral BID PRN Lindell Spar I, NP   5 mg at 06/21/16 1307  . sertraline (ZOLOFT) tablet 50 mg  50 mg Oral Daily Cobos, Myer Peer, MD   50 mg at 06/21/16 0354  . traZODone (DESYREL) tablet 100 mg  100 mg Oral QHS PRN Cobos, Myer Peer, MD   100 mg at 06/20/16 2228   Lab Results:  Results for orders placed or performed during the hospital encounter of 06/19/16 (from the past 48 hour(s))  Basic metabolic panel     Status: Abnormal   Collection Time: 06/20/16  6:20 PM  Result Value Ref Range   Sodium 141 135 - 145 mmol/L   Potassium 3.9 3.5 - 5.1 mmol/L   Chloride 105 101 - 111 mmol/L   CO2 28 22 - 32 mmol/L   Glucose, Bld 112 (H) 65 - 99 mg/dL   BUN 10 6 - 20 mg/dL   Creatinine, Ser 0.78 0.44 - 1.00 mg/dL   Calcium 9.3 8.9 - 10.3 mg/dL   GFR calc non Af Amer >60 >60 mL/min   GFR calc Af Amer >60 >60 mL/min    Comment: (NOTE) The eGFR has been calculated using the CKD EPI equation. This calculation has not been validated in all clinical situations. eGFR's persistently <60 mL/min signify possible Chronic Kidney Disease.    Anion gap 8 5 - 15    Comment: Performed at [facility], [place] [address] [place], [place] [address]  TSH     Status: None   Collection Time: 06/20/16  6:20 PM  Result Value Ref Range   TSH 3.111 0.350 - 4.500 uIU/mL    Comment: Performed by a 3rd Generation assay with a functional sensitivity of <=0.01  uIU/mL. Performed at [facility], [place] [address], [place], [place] [address]    Blood Alcohol level:  Lab Results  Component Value Date   ETH <5 61/95/0932   Metabolic Disorder Labs: No results found for: HGBA1C, MPG No results found for: PROLACTIN No results found for: CHOL, TRIG, HDL, CHOLHDL, VLDL, LDLCALC  Physical Findings: AIMS: Facial and Oral Movements Muscles of Facial Expression: None, normal Lips and Perioral Area: None, normal Jaw: None, normal Tongue: None, normal,Extremity Movements Upper (arms, wrists, hands, fingers): None, normal Lower (legs, knees, ankles, toes): None, normal, Trunk Movements Neck, shoulders, hips: None, normal, Overall Severity Severity of abnormal movements (highest score from questions above): None, normal Incapacitation due to abnormal movements: None, normal Patient's awareness of abnormal movements (rate only patient's report): No Awareness, Dental Status Current problems with teeth and/or dentures?: No Does patient usually wear dentures?: No  CIWA:    COWS:     Musculoskeletal: Strength & Muscle Tone: within normal limits Gait & Station: normal Patient leans: N/A  Psychiatric Specialty Exam: Physical Exam: Reviewed nurses notes & Vital signs.  Review of Systems  Psychiatric/Behavioral: Positive for depression and substance abuse (Hx. Polysubstance use disorder). Negative for hallucinations, memory loss and suicidal ideas. The patient is nervous/anxious and has insomnia.     Blood pressure 112/68, pulse 99, temperature 97.9 F (36.6 C), resp. rate 18, height _0  (1.626 m), weight 58.5 kg (129 lb), SpO2 100 %, not currently breastfeeding.Body mass index is 22.14 kg/m.  General Appearance: Fairly Groomed  Eye Contact:  Fair  Speech:  Normal Rate  Volume:  Normal  Mood:  Depressed , vaguely anxious, flat affect.  Affect: Flat, anxious  Thought Process:  Linear and Descriptions of Associations: Intact   Orientation:  Full (Time, Place, and Person)  Thought Content: Denies hallucinations, no delusions expressed, does not appear internally preoccupied   Suicidal Thoughts:  No denies current suicidal ideations and contracts for safety on unit, denies any homicidal or violent ideations   Homicidal Thoughts:  No  Memory:  recent and remote grossly intact   Judgement:  Fair  Insight:  Fair  Psychomotor Activity:  Normal  Concentration:  Concentration: Good and Attention Span: Good  Recall:  Good  Fund of Knowledge:  Good  Language:  Negative  Akathisia:  Negative  Handed:  Right  AIMS (if indicated):     Assets:  Communication Skills Desire for Improvement Resilience  ADL's:  Intact  Cognition:  WNL  Sleep:  Number of Hours: 6.75     Treatment Plan Summary: Patient continue to require mood stabilization treatments. Although, no evidence of psychosis, she continues report feeling very angry & enraged. No evidence of mania. Will add Risperdal 1 mg bid for mood control.   Psychiatric: Polysubstance use disorder, excluding opioid drugs. Substance Induced Mood Disorder (SUD)  Medical: Will continue monitor for any symptoms & treat on a prn basis.  Psychosocial:  Polysubstance use disorder. Will encourage group counseling attendance & participation.  PLAN: 1.06-21-16: New changes made on the current plan of care, continue current regimen as recommended.  Mood control/Stabilization:  Will initiate Risperdal 1 mg bid.  Agitation: Continue Zyprexa Zydis 5 mg bid prn.  Anxiety: Continue Lorazepam 1 mg Q 6 hours prn. Continue Hydroxyzine 25 mg prn Q 6 hours.  Depression: Continue Sertraline 50 mg daily.  Insomnia: Trazodone 100 mg Q hs.   Nicotine Withdrawal symptoms: Will continue the nicotine gum 2 mg.  2. Continue to monitor mood, behavior and interaction with  peers  Encarnacion Slates, NP, PMHNP, FNP-BC 06/21/2016, 5:52 PM   Agree with NP progress note

## 2016-06-22 MED ORDER — RISPERIDONE 1 MG PO TABS
1.0000 mg | ORAL_TABLET | Freq: Every day | ORAL | Status: DC
  Filled 2016-06-22: qty 1

## 2016-06-22 NOTE — Progress Notes (Signed)
[facility] MD Progress Note  06/22/2016 8:07 PM WINDI TORO  MRN:  [id number]  Subjective:  Patient reports she is feeling " a little better" and states that today has been a better day. At this time is focusing on being discharged soon. Denies medication side effects. Denies suicidal ideations.  Objective:  I have discussed case with treatment team and have met with patient . Presents with partially improved mood and range of affect. At this time not presenting irritable or angry. Denies any recent explosive angry episodes . Denies medication side effects. She is visible in day room, often on hallway phone, limited interaction with peers, but no disruptive behaviors.  Principal Problem: Major depressive disorder, recurrent severe, without psychotic features.  Diagnosis:   Patient Active Problem List   Diagnosis Date Noted  . Major depressive disorder, recurrent severe without psychotic features ([place]) [F33.2] 06/19/2016  . Cocaine abuse [F14.10] 06/19/2016  . Migraine headache without aura [G43.009] 11/16/2014  . Nexplanon in place [Z97.5] 11/16/2014   Total Time spent with patient: 20 minutes   Past Psychiatric History: Mdd, severe, Cocaine use disorder.  Past Medical History:  Past Medical History:  Diagnosis Date  . Anemia   . Anxiety   . Depression   . Headache   . No pertinent past medical history   . Pregnancy     Past Surgical History:  Procedure Laterality Date  . NO PAST SURGERIES     Family History:  Family History  Problem Relation Age of Onset  . Alcohol abuse Neg Hx   . Arthritis Neg Hx   . Asthma Neg Hx   . Birth defects Neg Hx   . Cancer Neg Hx   . COPD Neg Hx   . Depression Neg Hx   . Diabetes Neg Hx   . Drug abuse Neg Hx   . Early death Neg Hx   . Hearing loss Neg Hx   . Heart disease Neg Hx   . Hyperlipidemia Neg Hx   . Hypertension Neg Hx   . Kidney disease Neg Hx   . Learning disabilities Neg Hx   . Mental illness Neg Hx   . Mental  retardation Neg Hx   . Miscarriages / Stillbirths Neg Hx   . Stroke Neg Hx   . Vision loss Neg Hx   . Varicose Veins Neg Hx    Family Psychiatric  History: See H&P.  Social History:  History  Alcohol Use  . 7.2 - 10.8 oz/week  . 12 - 18 Cans of beer per week     History  Drug Use  . Types: Cocaine, Marijuana    Social History   Social History  . Marital status: Single    Spouse name: N/A  . Number of children: N/A  . Years of education: N/A   Social History Main Topics  . Smoking status: Current Every Day Smoker    Packs/day: 2.00    Types: Cigarettes  . Smokeless tobacco: Never Used     Comment: refused  . Alcohol use 7.2 - 10.8 oz/week    12 - 18 Cans of beer per week  . Drug use: Yes    Types: Cocaine, Marijuana  . Sexual activity: Yes    Birth control/ protection: Implant     Comment: would like information on tubal ligation   Other Topics Concern  . None   Social History Narrative  . None   Additional Social History:    History of alcohol /  drug use?: Yes  Sleep: improving   Appetite:  improving   Current Medications: Current Facility-Administered Medications  Medication Dose Route Frequency Provider Last Rate Last Dose  . alum & mag hydroxide-simeth (MAALOX/MYLANTA) 200-200-20 MG/5ML suspension 30 mL  30 mL Oral Q4H PRN Patrecia Pour, NP      . hydrOXYzine (ATARAX/VISTARIL) tablet 25 mg  25 mg Oral Q6H PRN Onetha Gaffey, Myer Peer, MD   25 mg at 06/22/16 0948  . ibuprofen (ADVIL,MOTRIN) tablet 600 mg  600 mg Oral Q8H PRN Patrecia Pour, NP      . LORazepam (ATIVAN) tablet 1 mg  1 mg Oral Q6H PRN Emmabelle Fear, Myer Peer, MD   1 mg at 06/22/16 1337  . magnesium hydroxide (MILK OF MAGNESIA) suspension 30 mL  30 mL Oral Daily PRN Patrecia Pour, NP      . nicotine polacrilex (NICORETTE) gum 2 mg  2 mg Oral PRN Mahagony Grieb, Myer Peer, MD   2 mg at 06/20/16 1710  . OLANZapine zydis (ZYPREXA) disintegrating tablet 5 mg  5 mg Oral BID PRN Lindell Spar I, NP   5 mg at  06/22/16 1338  . risperiDONE (RISPERDAL) tablet 1 mg  1 mg Oral BID Lindell Spar I, NP   1 mg at 06/22/16 1648  . sertraline (ZOLOFT) tablet 50 mg  50 mg Oral Daily Juancarlos Crescenzo, Myer Peer, MD   50 mg at 06/22/16 0945  . traZODone (DESYREL) tablet 100 mg  100 mg Oral QHS PRN Adonay Scheier, Myer Peer, MD   100 mg at 06/21/16 2142   Lab Results:  No results found for this or any previous visit (from the past 48 hour(s)). Blood Alcohol level:  Lab Results  Component Value Date   ETH <5 18/56/3149   Metabolic Disorder Labs: No results found for: HGBA1C, MPG No results found for: PROLACTIN No results found for: CHOL, TRIG, HDL, CHOLHDL, VLDL, LDLCALC  Physical Findings: AIMS: Facial and Oral Movements Muscles of Facial Expression: None, normal Lips and Perioral Area: None, normal Jaw: None, normal Tongue: None, normal,Extremity Movements Upper (arms, wrists, hands, fingers): None, normal Lower (legs, knees, ankles, toes): None, normal, Trunk Movements Neck, shoulders, hips: None, normal, Overall Severity Severity of abnormal movements (highest score from questions above): None, normal Incapacitation due to abnormal movements: None, normal Patient's awareness of abnormal movements (rate only patient's report): No Awareness, Dental Status Current problems with teeth and/or dentures?: No Does patient usually wear dentures?: No  CIWA:    COWS:     Musculoskeletal: Strength & Muscle Tone: within normal limits Gait & Station: normal Patient leans: N/A  Psychiatric Specialty Exam: Physical Exam: Reviewed nurses notes & Vital signs.  Review of Systems  Psychiatric/Behavioral: Positive for depression and substance abuse (Hx. Polysubstance use disorder). Negative for hallucinations, memory loss and suicidal ideas. The patient is nervous/anxious and has insomnia.   denies nausea or vomiting   Blood pressure 107/66, pulse 84, temperature 99.9 F (37.7 C), temperature source Oral, resp. rate 16, height  _0  (1.626 m), weight 58.5 kg (129 lb), SpO2 100 %, not currently breastfeeding.Body mass index is 22.14 kg/m.  General Appearance: improved grooming   Eye Contact:  good   Speech:  Normal Rate  Volume:  Normal  Mood:  improving mood, less depressed  Affect: improving range of affect, less constricted, less irritable   Thought Process:  Linear and Descriptions of Associations: Intact  Orientation:  Full (Time, Place, and Person)  Thought Content: Denies hallucinations, no delusions  expressed, does not appear internally preoccupied   Suicidal Thoughts:  No  denies current suicidal ideations and contracts for safety on unit, denies any homicidal or violent ideations   Homicidal Thoughts:  No   Memory:  recent and remote grossly intact   Judgement:  improving   Insight:  fair- improving   Psychomotor Activity:  Normal- more visible on unit   Concentration:  Concentration: Good and Attention Span: Good  Recall:  Good  Fund of Knowledge:  Good  Language:  Normal   Akathisia:  Negative  Handed:  Right  AIMS (if indicated):     Assets:  Communication Skills Desire for Improvement Resilience  ADL's:  Intact   Cognition:  WNL  Sleep:  Number of Hours: 6.75      Assessment - patient is presenting with improving mood and range of affect. She presents less depressed and at this time not irritable or dysphoric, denies suicidal ideations, and is currently focused on being discharged soon. Denies medication side effects.  Treatment Plan Summary: Treatment plan reviewed as below today 6/14  Continue to encourage group and milieu participation Continue to encourage efforts to work on sobriety and relapse prevention   Mood control/Stabilization:  Continue Risperidone , will decrease to 1 mgr QHS  Agitation: Continue Zyprexa Zydis 5 mg bid prn.  Anxiety: Continue Lorazepam 1 mg Q 6 hours prn. Continue Hydroxyzine 25 mg prn Q 6 hours.  Depression: Continue Sertraline 50 mg  daily.  Insomnia: Continue Trazodone 100 mg qhs PRN as needed    Nicotine Withdrawal symptoms: Will continue the nicotine gum 2 mg.  Treatment team working on disposition planning options  Jenne Campus, MD 06/22/2016, 8:07 PM   Patient ID: Orpah Clinton, female   DOB: 1995-09-25, 21 y.o.   MRN: [id number]

## 2016-06-22 NOTE — Progress Notes (Signed)
Patient ID: Nancy Henson, female   DOB: Feb 01, 1995, 21 y.o.   MRN: [id number]  D: Patient denies SI/HI and auditory and visual hallucinations. Patient has a depressed mood and affect.Stated she wants to be more positive and have a better day than yesterday. Rates her depression as 4, hopelessness 5, anxiety 2. Slept well last pm, states she has a good appetite and reports no symptoms of withdrawal.  A: Patient given emotional support from RN. Patient given medications per MD orders. Patient encouraged to attend groups and unit activities. Patient encouraged to come to staff with any questions or concerns.  R: Patient remains cooperative and appropriate. Will continue to monitor patient for safety.

## 2016-06-23 MED ORDER — HYDROXYZINE HCL 25 MG PO TABS: 25.0000 mg | ORAL_TABLET | Freq: Four times a day (QID) | ORAL | 0 refills | Status: DC | PRN

## 2016-06-23 MED ORDER — LORAZEPAM 1 MG PO TABS: 1.0000 mg | ORAL_TABLET | Freq: Four times a day (QID) | ORAL | 0 refills | Status: DC | PRN

## 2016-06-23 MED ORDER — TRAZODONE HCL 100 MG PO TABS: 100.0000 mg | ORAL_TABLET | Freq: Every evening | ORAL | 0 refills | Status: DC | PRN

## 2016-06-23 MED ORDER — RISPERIDONE 1 MG PO TABS: 1.0000 mg | ORAL_TABLET | Freq: Every day | ORAL | 0 refills | Status: DC

## 2016-06-23 MED ORDER — NICOTINE POLACRILEX 2 MG MT GUM: 2.0000 mg | CHEWING_GUM | OROMUCOSAL | 0 refills | Status: DC | PRN

## 2016-06-23 MED ORDER — SERTRALINE HCL 50 MG PO TABS: 50.0000 mg | ORAL_TABLET | Freq: Every day | ORAL | 0 refills | Status: DC

## 2016-06-23 NOTE — Progress Notes (Signed)
  [facility] :  Will you be returning to the same living situation after discharge:  Yes,  home with family  At discharge, do you have transportation home?: Yes,  family member Do you have the ability to pay for your medications: Yes,  [facility]  Release of information consent forms completed and submitted to medical records by CSW.  Patient to Follow up at: Follow-up Information    [facility] up on 06/26/2016.   Specialty:  Family Medicine Why:  Appt for hospital follow-up/medication management on Monday, June 18th at 4:45pm. Please bring photo ID and medicaid card to this appt. Thank you.  Contact information: [address] [place] [address] [phone]        [facility] Follow up on 06/29/2016.   Specialty:  Behavioral Health Why:  Appt on Thursday at 9:30AM with Theodoro Gristave for counseling. Please bring: photo ID and medicaid card to this appt. Please call within 48 hours of appt to cancel or reschedule if needed.  Contact information: [address] [place] [address] [phone]        [facility]. Go to.   Specialty:  Behavioral Health Why:  Patient accepted Transitional Care Team, services will begin on day of discharge.  Contact information: [address] [place] [address] [phone]           Next level of care provider [facility] Link:no  Safety Planning and Suicide Prevention discussed: Yes,  SPE completed with pt's sister. SPI pamphlet and Mobile Crisis information provided to pt  Have you used any form of tobacco in the last 30 days? (Cigarettes, Smokeless Tobacco, Cigars, and/or Pipes): Yes  Has patient been referred to the Quitline?: Patient refused referral  Patient has been referred for addiction treatment: Yes  Lachrista Heslin N Smart LCSW 06/23/2016, 10:51 AM

## 2016-06-23 NOTE — Progress Notes (Signed)
D: Pt was in the dayroom upon initial approach.  Pt presents with anxious affect and mood.  Her goal is "to have a better day than yesterday."  Pt reports she met her goal.  She reports she is discharging tomorrow and she feels safe to do so.  Pt denies SI/HI, denies hallucinations, denies pain.  Pt has been visible in milieu interacting with peers and staff appropriately.  Pt attended evening group.    A: Introduced self to pt.  Actively listened to pt and offered support and encouragement. PRN medication administered for anxiety and sleep.  Q15 minute safety checks maintained.  R: Pt is safe on the unit.  Pt is compliant with medications.  Pt verbally contracts for safety.  Will continue to monitor and assess.

## 2016-06-23 NOTE — Progress Notes (Signed)
Data. Patient denies SI/HI/AVH. Patient interacting well with staff and other patients. Affect is flat, but brightens with interaction, and she continues to say she is, "Depresssed, but feeling better."  Action. Emotional support and encouragement offered. Education provided on medication, indications and side effect. Q 15 minute checks done for safety. Response. Safety on the unit maintained through 15 minute checks.  Medications taken as prescribed.  Remained calm and appropriate through out shift.  Pt. discharged to lobby.  Belongings sheet reviewed and signed by pt. and all belongings, including scripts, sent home. Paperwork reviewed and pt. able to verbalize understanding of education. Pt. in no current distress and ambulatory.

## 2016-06-23 NOTE — [facility] Suicide Risk Assessment (Signed)
[facility] Discharge Suicide Risk Assessment   Principal Problem:  Depression, Cocaine Use Disorder  Discharge Diagnoses:  Patient Active Problem List   Diagnosis Date Noted  . Major depressive disorder, recurrent severe without psychotic features ([facility]) [F33.2] 06/19/2016  . Cocaine abuse [F14.10] 06/19/2016  . Migraine headache without aura [G43.009] 11/16/2014  . Nexplanon in place [Z97.5] 11/16/2014    Total Time spent with patient: 30 minutes  Musculoskeletal: Strength & Muscle Tone: within normal limits Gait & Station: normal Patient leans: N/A  Psychiatric Specialty Exam: ROS reports brief episode of dizziness earlier in the day, but now completely resolved. No current headache, no light headedness, no nausea, no vomiting , no diarrhea  Blood pressure 136/66, pulse 75, temperature 99.1 F (37.3 C), temperature source Oral, resp. rate 16, height 5\' 4"  (1.626 m), weight 58.5 kg (129 lb), SpO2 99 %, not currently breastfeeding.Body mass index is 22.14 kg/m.  General Appearance: Well Groomed  Eye Contact::  Good  Speech:  Normal Rate409  Volume:  Normal  Mood:  reports improving mood, feels " better"  Affect:  Appropriate and more reactive  Thought Process:  Linear and Descriptions of Associations: Intact  Orientation:  Full (Time, Place, and Person)  Thought Content:  no hallucinations, no delusions, not internally preoccupied   Suicidal Thoughts:  denies suicidal ideations , denies any self injurious ideations  Homicidal Thoughts:  No- denies any homicidal or violent ideations  Memory:  recent and remote grossly intact   Judgement:  Other:  improving   Insight:  improving   Psychomotor Activity:  Normal  Concentration:  Good  Recall:  Good  Fund of Knowledge:Good  Language: Good  Akathisia:  Negative  Handed:  Right  AIMS (if indicated):     Assets:  Communication Skills Desire for Improvement Resilience  Sleep:  Number of Hours: 6.75  Cognition: WNL  ADL's:  Intact    Mental Status Per Nursing Assessment::   On Admission:  Suicidal ideation indicated by patient, Suicidal ideation indicated by others, Suicide plan, Self-harm thoughts, Self-harm behaviors, Belief that plan would result in death  Demographic Factors:  21 year old single female, has three children , lives with children. Mother lives with patient .   Loss Factors: Being single mother, having difficulty getting a job due to child care responsibilities, recent relationship stressors Historical Factors: No prior psychiatric admissions, no history of prior suicide attempts, remote history of self cutting, history of alcohol abuse   Risk Reduction Factors:   Responsible for children under 39 years of age, Sense of responsibility to family, Living with another person, especially a relative and Positive coping skills or problem solving skills  Continued Clinical Symptoms:  Alert and attentive, well related, calm, mood improved, affect appropriate, more reactive than on admission, no thought disorder, no suicidal or self injurious ideations, no hallucinations, no delusions, not internally preoccupied .  Denies medication side effects .  Cognitive Features That Contribute To Risk:  No gross cognitive deficits noted upon discharge. Is alert , attentive, and oriented x 3   Suicide Risk:  Mild:  Suicidal ideation of limited frequency, intensity, duration, and specificity.  There are no identifiable plans, no associated intent, mild dysphoria and related symptoms, good self-control (both objective and subjective assessment), few other risk factors, and identifiable protective factors, including available and accessible social support.  Follow-up Information    [facility] Follow up on 06/26/2016.   Specialty:  Family Medicine Why:  Appt for hospital  follow-up/medication management on Monday, June 18th at 4:45pm. Please bring photo ID and medicaid card to this appt. Thank you.   Contact information: [address] [place] [address] [phone]        [facility] Follow up on 06/29/2016.   Specialty:  Behavioral Health Why:  Appt on Thursday at 9:30AM with Theodoro Gristave for counseling. Please bring: photo ID and medicaid card to this appt. Please call within 48 hours of appt to cancel or reschedule if needed.  Contact information: [address] [place] [address] [phone]        [facility]. Go to.   Specialty:  Behavioral Health Why:  Patient accepted Transitional Care Team, services will begin on day of discharge.  Contact information: [address] [place] [address] [phone]           Plan Of Care/Follow-up recommendations:  Activity:  as tolerated Diet:  Regular Tests:  NA Other:  See below  Patient reports she feels ready for discharge-  is leaving unit in good spirits. Plans to return back home Follow up as above   Craige CottaFernando A Cobos, MD 06/23/2016, 11:52 AM

## 2016-06-23 NOTE — Tx Team (Signed)
Interdisciplinary Treatment and Diagnostic Plan Update  06/23/2016 Time of Session: 9:30am Nancy Henson MRN: [id number]  Principal Diagnosis: MDD, no psychotic features  Secondary Diagnoses: Active Problems:   Major depressive disorder, recurrent severe without psychotic features ([place])   Current Medications:  Current Facility-Administered Medications  Medication Dose Route Frequency Provider Last Rate Last Dose  . alum & mag hydroxide-simeth (MAALOX/MYLANTA) 200-200-20 MG/5ML suspension 30 mL  30 mL Oral Q4H PRN Patrecia Pour, NP      . hydrOXYzine (ATARAX/VISTARIL) tablet 25 mg  25 mg Oral Q6H PRN Cobos, Myer Peer, MD   25 mg at 06/22/16 2007  . ibuprofen (ADVIL,MOTRIN) tablet 600 mg  600 mg Oral Q8H PRN Patrecia Pour, NP      . LORazepam (ATIVAN) tablet 1 mg  1 mg Oral Q6H PRN Cobos, Myer Peer, MD   1 mg at 06/22/16 2145  . magnesium hydroxide (MILK OF MAGNESIA) suspension 30 mL  30 mL Oral Daily PRN Patrecia Pour, NP      . nicotine polacrilex (NICORETTE) gum 2 mg  2 mg Oral PRN Cobos, Myer Peer, MD   2 mg at 06/20/16 1710  . OLANZapine zydis (ZYPREXA) disintegrating tablet 5 mg  5 mg Oral BID PRN Lindell Spar I, NP   5 mg at 06/22/16 1338  . risperiDONE (RISPERDAL) tablet 1 mg  1 mg Oral QHS Cobos, Fernando A, MD      . sertraline (ZOLOFT) tablet 50 mg  50 mg Oral Daily Cobos, Myer Peer, MD   50 mg at 06/23/16 0750  . traZODone (DESYREL) tablet 100 mg  100 mg Oral QHS PRN Cobos, Myer Peer, MD   100 mg at 06/22/16 2145    PTA Medications: Prescriptions Prior to Admission  Medication Sig Dispense Refill Last Dose  . citalopram (CELEXA) 20 MG tablet Take 20 mg by mouth daily.  0 06/18/2016 at Unknown time  . etonogestrel (NEXPLANON) 68 MG IMPL implant 1 each by Subdermal route once.   10/11/2014 at unknown    Treatment Modalities: Medication Management, Group therapy, Case management,  1 to 1 session with clinician, Psychoeducation, Recreational therapy.  Patient  Stressors: Marital or family conflict Substance abuse  Patient Strengths: Ability for insight General fund of knowledge Physical Health Supportive family/friends  Physician Treatment Plan for Primary Diagnosis: MDD, no psychotic features Long Term Goal(s): Improvement in symptoms so as ready for discharge  Short Term Goals: Ability to verbalize feelings will improve Ability to disclose and discuss suicidal ideas Ability to demonstrate self-control will improve Ability to identify and develop effective coping behaviors will improve Ability to maintain clinical measurements within normal limits will improve Compliance with prescribed medications will improve Ability to verbalize feelings will improve Ability to disclose and discuss suicidal ideas Ability to demonstrate self-control will improve Ability to identify and develop effective coping behaviors will improve Ability to maintain clinical measurements within normal limits will improve Compliance with prescribed medications will improve  Medication Management: Evaluate patient's response, side effects, and tolerance of medication regimen.  Therapeutic Interventions: 1 to 1 sessions, Unit Group sessions and Medication administration.  Evaluation of Outcomes: Met  Physician Treatment Plan for Secondary Diagnosis: Active Problems:   Major depressive disorder, recurrent severe without psychotic features ([place])   Long Term Goal(s): Improvement in symptoms so as ready for discharge  Short Term Goals: Ability to verbalize feelings will improve Ability to disclose and discuss suicidal ideas Ability to demonstrate self-control will improve Ability to identify and  develop effective coping behaviors will improve Ability to maintain clinical measurements within normal limits will improve Compliance with prescribed medications will improve Ability to verbalize feelings will improve Ability to disclose and discuss suicidal  ideas Ability to demonstrate self-control will improve Ability to identify and develop effective coping behaviors will improve Ability to maintain clinical measurements within normal limits will improve Compliance with prescribed medications will improve  Medication Management: Evaluate patient's response, side effects, and tolerance of medication regimen.  Therapeutic Interventions: 1 to 1 sessions, Unit Group sessions and Medication administration.  Evaluation of Outcomes: Met   RN Treatment Plan for Primary Diagnosis: MDD, no psychotic features Long Term Goal(s): Knowledge of disease and therapeutic regimen to maintain health will improve  Short Term Goals: Ability to verbalize feelings will improve, Ability to disclose and discuss suicidal ideas and Ability to identify and develop effective coping behaviors will improve  Medication Management: RN will administer medications as ordered by provider, will assess and evaluate patient's response and provide education to patient for prescribed medication. RN will report any adverse and/or side effects to prescribing provider.  Therapeutic Interventions: 1 on 1 counseling sessions, Psychoeducation, Medication administration, Evaluate responses to treatment, Monitor vital signs and CBGs as ordered, Perform/monitor CIWA, COWS, AIMS and Fall Risk screenings as ordered, Perform wound care treatments as ordered.  Evaluation of Outcomes: Met   LCSW Treatment Plan for Primary Diagnosis: MDD, no psychotic features Long Term Goal(s): Safe transition to appropriate next level of care at discharge, Engage patient in therapeutic group addressing interpersonal concerns.  Short Term Goals: Engage patient in aftercare planning with referrals and resources, Identify triggers associated with mental health/substance abuse issues and Increase skills for wellness and recovery  Therapeutic Interventions: Assess for all discharge needs, 1 to 1 time with Social  worker, Explore available resources and support systems, Assess for adequacy in community support network, Educate family and significant other(s) on suicide prevention, Complete Psychosocial Assessment, Interpersonal group therapy.  Evaluation of Outcomes: Met   Progress in Treatment: Attending groups: Yes Participating in groups: Yes, when she attends  Taking medication as prescribed: Yes Toleration medication: Yes, no side effects reported at this time Family/Significant other contact made: Yes with sister Patient understands diagnosis: Yes, insight improving.  Discussing patient identified problems/goals with staff: Yes Medical problems stabilized or resolved: Yes Denies suicidal/homicidal ideation: Yes Issues/concerns per patient self-inventory: None Other: N/A  New problem(s) identified: None identified at this time.   New Short Term/Long Term Goal(s): None identified at this time.   Discharge Plan or Barriers: Pt will return home and follow up [facility]  Reason for Continuation of Hospitalization: none  Estimated Length of Stay: discharge today   Attendees: Patient: 06/23/2016  10:53 AM  Physician: Dr. Parke Poisson 06/23/2016  10:53 AM  Nursing: Earnie Larsson RN 06/23/2016  10:53 AM  RN Care Manager: Lars Pinks, RN 06/23/2016  10:53 AM  Social Worker:Lachanda Buczek Smart, LCSW 06/23/2016  10:53 AM  Recreational Therapist:  06/23/2016  10:53 AM  Other: Lindell Spar, NP 06/23/2016  10:53 AM  Other:  06/23/2016  10:53 AM  Other: 06/23/2016  10:53 AM    Scribe for Treatment Team: Maxie Better, MSW, LCSW Clinical Social Worker 06/23/2016 10:54 AM

## 2016-06-23 NOTE — Discharge Summary (Addendum)
Physician Discharge Summary Note  Patient:  Nancy Henson is an 21 y.o., female MRN:  [id number] DOB:  Jun 12, 1995 Patient phone:  [phone] (home)  Patient address:   [address] [place] [address],   Total Time spent with patient: Greater than 30 minutes  Date of Admission:  06/19/2016  Date of Discharge: 06-23-16  Reason for Admission: Worsening symptoms of depression triggering suicidal ideations with plans,  Principal Problem: Major depressive disorder, recurrent episodes,   Discharge Diagnoses: Patient Active Problem List   Diagnosis Date Noted  . Major depressive disorder, recurrent severe without psychotic features ([facility]) [F33.2] 06/19/2016  . Cocaine abuse [F14.10] 06/19/2016  . Migraine headache without aura [G43.009] 11/16/2014  . Nexplanon in place [Z97.5] 11/16/2014   Past Psychiatric History: Major depressive disorder, recurrent severe, Cocaine use disorder.  Past Medical History:  Past Medical History:  Diagnosis Date  . Anemia   . Anxiety   . Depression   . Headache   . No pertinent past medical history   . Pregnancy     Past Surgical History:  Procedure Laterality Date  . NO PAST SURGERIES     Family History:  Family History  Problem Relation Age of Onset  . Alcohol abuse Neg Hx   . Arthritis Neg Hx   . Asthma Neg Hx   . Birth defects Neg Hx   . Cancer Neg Hx   . COPD Neg Hx   . Depression Neg Hx   . Diabetes Neg Hx   . Drug abuse Neg Hx   . Early death Neg Hx   . Hearing loss Neg Hx   . Heart disease Neg Hx   . Hyperlipidemia Neg Hx   . Hypertension Neg Hx   . Kidney disease Neg Hx   . Learning disabilities Neg Hx   . Mental illness Neg Hx   . Mental retardation Neg Hx   . Miscarriages / Stillbirths Neg Hx   . Stroke Neg Hx   . Vision loss Neg Hx   . Varicose Veins Neg Hx    Family Psychiatric  History: See H&P  Social History:  History  Alcohol Use  . 7.2 - 10.8 oz/week  . 12 - 18 Cans of beer per week     History   Drug Use  . Types: Cocaine, Marijuana    Social History   Social History  . Marital status: Single    Spouse name: N/A  . Number of children: N/A  . Years of education: N/A   Social History Main Topics  . Smoking status: Current Every Day Smoker    Packs/day: 2.00    Types: Cigarettes  . Smokeless tobacco: Never Used     Comment: refused  . Alcohol use 7.2 - 10.8 oz/week    12 - 18 Cans of beer per week  . Drug use: Yes    Types: Cocaine, Marijuana  . Sexual activity: Yes    Birth control/ protection: Implant     Comment: would like information on tubal ligation   Other Topics Concern  . None   Social History Narrative  . None   Hospital Course:  21 year old female. States she has a long history of depression but states she has been feeling worse recently and reports she had suicidal ideations with thoughts of overdosing on medications . States that 2 days ago " I came close to actually overdosing on Ibuprofen, but I decided against it". On 6/10 she states she  was feeling more depressed, anxious, " crying " and impulsively cut self on wrist - has superficial lacerations, no active bleeding , did not need sutures. After this event family encouraged her to come to the hospital. She attributes worsening depression to several stressors, being a single mother, being unable to get a job because of child care requirements, having difficulties with fiance. Of note, states she recently used cocaine and cannabis x 1 in an effort to feel better but denies any pattern of abusing drugs. She states she does drink daily, often to intoxication- 3-4 beers per day. States she has been drinking daily over the last 2-3 weeks.  Addis was admitted to the [facility] adult unit with complaints of worsening symptoms of depression triggering suicidal ideations with plans to overdose. She cited single motherhood, inability to get a job & financial issues as the trigger.  She was in need of mood stabilization  treatments.   During the course of her treatment, Mahalia was medicated & discharged on, Sertraline 50 mg for depression, Hydroxyzine 25 mg prn for anxiety, Lorazepam 0.5 mg prn for severe anxiety, Risperdal 1 mg for mood control, Nicotine gum 2 mg for smoking cessation & Trazodone 100 mg insomnia. She was enrolled & participated in the group counseling sessions being offered & held on this unit. She was counseled & learned coping skills that should help her cope better & maintain mood stability after discharge. She tolerated her treatment regimen without any adverse effects reported.   While her treatment was on going, Gila's improvement was monitored by observation & her daily reports of symptom reduction noted.  Her emotional & mental status were monitored by daily self-inventory reports completed by her & the clinical staff. She was evaluated daily by the treatment team for mood stability & the need for continued recovery after discharge. She was offered further treatment options upon discharge & will follow up with the outpatient psychiatric services as listed below.     Upon discharge, Madonna was both mentally & medically stable. She is currently denying suicidal, homicidal ideation, auditory, visual/tactile hallucinations, delusional thoughts & or paranoia. She was provided with a 7 days worth, supply samples of her [facility] discharge medications. She left [facility] with all personal belongings in no apparent distress. Transportation per family.  Physical Findings: AIMS: Facial and Oral Movements Muscles of Facial Expression: None, normal Lips and Perioral Area: None, normal Jaw: None, normal Tongue: None, normal,Extremity Movements Upper (arms, wrists, hands, fingers): None, normal Lower (legs, knees, ankles, toes): None, normal, Trunk Movements Neck, shoulders, hips: None, normal, Overall Severity Severity of abnormal movements (highest score from questions above): None, normal Incapacitation due  to abnormal movements: None, normal Patient's awareness of abnormal movements (rate only patient's report): No Awareness, Dental Status Current problems with teeth and/or dentures?: No Does patient usually wear dentures?: No  CIWA:    COWS:     Musculoskeletal: Strength & Muscle Tone: within normal limits Gait & Station: normal Patient leans: N/A  Psychiatric Specialty Exam: Physical Exam  Constitutional: She appears well-developed.  HENT:  Head: Normocephalic.  Eyes: Pupils are equal, round, and reactive to light.  Neck: Normal range of motion.  Cardiovascular: Normal rate.   Respiratory: Effort normal.  GI: Soft.  Genitourinary:  Genitourinary Comments: Deferred  Musculoskeletal: Normal range of motion.  Neurological: She is alert.  Skin: Skin is warm.    Review of Systems  Constitutional: Negative.   HENT: Negative.   Eyes: Negative.   Respiratory: Negative.  Cardiovascular: Negative.   Gastrointestinal: Negative.   Musculoskeletal: Negative.   Skin: Negative.   Neurological: Negative.   Endo/Heme/Allergies: Negative.   Psychiatric/Behavioral: Positive for depression (Stable) and substance abuse (Hx. Cocaine use disorder). Negative for hallucinations, memory loss and suicidal ideas. The patient has insomnia (Stable). The patient is not nervous/anxious.     Blood pressure (!) 111/47, pulse 64, temperature 97.1 F (36.2 C), resp. rate 16, height 5\' 4"  (1.626 m), weight 58.5 kg (129 lb), SpO2 100 %, not currently breastfeeding.Body mass index is 22.14 kg/m.  See Md's SRA   Have you used any form of tobacco in the last 30 days? (Cigarettes, Smokeless Tobacco, Cigars, and/or Pipes): Yes  Has this patient used any form of tobacco in the last 30 days? (Cigarettes, Smokeless Tobacco, Cigars, and/or Pipes):Yes, provided with Nicorette gum prescription for smoking cessation.  Blood Alcohol level:  Lab Results  Component Value Date   ETH <5 06/18/2016   Metabolic  Disorder Labs:  No results found for: HGBA1C, MPG No results found for: PROLACTIN No results found for: CHOL, TRIG, HDL, CHOLHDL, VLDL, LDLCALC  See Psychiatric Specialty Exam and Suicide Risk Assessment completed by Attending Physician prior to discharge.  Discharge destination:  Home  Is patient on multiple antipsychotic therapies at discharge:  No   Has Patient had three or more failed trials of antipsychotic monotherapy by history:  No  Recommended Plan for Multiple Antipsychotic Therapies: NA  Allergies as of 06/23/2016   No Known Allergies     Medication List    STOP taking these medications   citalopram 20 MG tablet Commonly known as:  CELEXA   NEXPLANON 68 MG Impl implant Generic drug:  etonogestrel     TAKE these medications     Indication  hydrOXYzine 25 MG tablet Commonly known as:  ATARAX/VISTARIL Take 1 tablet (25 mg total) by mouth every 6 (six) hours as needed for anxiety.  Indication:  Anxiety Neurosis   LORazepam 1 MG tablet Commonly known as:  ATIVAN Take 1 tablet (1 mg total) by mouth every 6 (six) hours as needed for anxiety.  Indication:  Agitation, Anxiety   nicotine polacrilex 2 MG gum Commonly known as:  NICORETTE Take 1 each (2 mg total) by mouth as needed for smoking cessation.  Indication:  Nicotine Addiction   risperiDONE 1 MG tablet Commonly known as:  RISPERDAL Take 1 tablet (1 mg total) by mouth at bedtime. For mood control  Indication:  Mood control, agitation   sertraline 50 MG tablet Commonly known as:  ZOLOFT Take 1 tablet (50 mg total) by mouth daily. For depression Start taking on:  06/24/2016  Indication:  Major Depressive Disorder   traZODone 100 MG tablet Commonly known as:  DESYREL Take 1 tablet (100 mg total) by mouth at bedtime as needed for sleep.  Indication:  Trouble Sleeping      Follow-up Information    [facility] up on 06/26/2016.   Specialty:  Family Medicine Why:  Appt for  hospital follow-up/medication management on Monday, June 18th at 4:45pm. Please bring photo ID and medicaid card to this appt. Thank you.  Contact information: [address] [place] [address] [phone]        [facility] Follow up on 06/29/2016.   Specialty:  Behavioral Health Why:  Appt on Thursday at 9:30AM with Theodoro Grist for counseling. Please bring: photo ID and medicaid card to this appt.  Please call within 48 hours of appt to cancel or reschedule if needed.  Contact information: [address] [place] [address] [phone]        [facility] Go to.   Specialty:  Behavioral Health Why:  Patient accepted Transitional Care Team, services will begin on day of discharge.  Contact informationElpidio Eric: [address] [place] [address] [phone]          Follow-up recommendations: Activity:  As tolerated Diet: As recommended by your primary care doctor. Keep all scheduled follow-up appointments as recommended.   Comments: Patient is instructed prior to discharge to: Take all medications as prescribed by his/her mental healthcare provider. Report any adverse effects and or reactions from the medicines to his/her outpatient provider promptly. Patient has been instructed & cautioned: To not engage in alcohol and or illegal drug use while on prescription medicines. In the event of worsening symptoms, patient is instructed to call the crisis hotline, 911 and or go to the nearest ED for appropriate evaluation and treatment of symptoms. To follow-up with his/her primary care provider for your other medical issues, concerns and or health care needs.   Signed: Sanjuana KavaNwoko, Agnes I, NP, PMHNP, FNP-BC 06/23/2016, 10:34 AM   Patient seen, Suicide Assessment Completed.  Disposition Plan Reviewed

## 2016-07-02 ENCOUNTER — Encounter (HOSPITAL_COMMUNITY): Payer: Self-pay | Admitting: Emergency Medicine

## 2016-07-02 ENCOUNTER — Emergency Department (HOSPITAL_COMMUNITY)
Admission: EM | Admit: 2016-07-02 | Discharge: 2016-07-02 | Disposition: A | Discharge status: Home / Self Care | Payer: Medicaid Other | Attending: Emergency Medicine | Admitting: Emergency Medicine

## 2016-07-02 DIAGNOSIS — T148XXA Other injury of unspecified body region, initial encounter: Secondary | ICD-10-CM | POA: Insufficient documentation

## 2016-07-02 DIAGNOSIS — X509XXA Other and unspecified overexertion or strenuous movements or postures, initial encounter: Secondary | ICD-10-CM | POA: Diagnosis not present

## 2016-07-02 DIAGNOSIS — F17210 Nicotine dependence, cigarettes, uncomplicated: Secondary | ICD-10-CM | POA: Diagnosis not present

## 2016-07-02 DIAGNOSIS — Y999 Unspecified external cause status: Secondary | ICD-10-CM | POA: Diagnosis not present

## 2016-07-02 DIAGNOSIS — Y9389 Activity, other specified: Secondary | ICD-10-CM | POA: Insufficient documentation

## 2016-07-02 DIAGNOSIS — Z79899 Other long term (current) drug therapy: Secondary | ICD-10-CM | POA: Insufficient documentation

## 2016-07-02 DIAGNOSIS — Y929 Unspecified place or not applicable: Secondary | ICD-10-CM | POA: Diagnosis not present

## 2016-07-02 DIAGNOSIS — S3992XA Unspecified injury of lower back, initial encounter: Secondary | ICD-10-CM | POA: Diagnosis present

## 2016-07-02 MED ORDER — LIDOCAINE 5 % EX PTCH: 1.0000 | MEDICATED_PATCH | CUTANEOUS | 0 refills | Status: DC

## 2016-07-02 MED ORDER — METHOCARBAMOL 500 MG PO TABS: 500.0000 mg | ORAL_TABLET | Freq: Four times a day (QID) | ORAL | 0 refills | Status: DC | PRN

## 2016-07-02 NOTE — ED Notes (Signed)
Bed: WTR9 Expected date:  Expected time:  Means of arrival:  Comments: 

## 2016-07-02 NOTE — Discharge Instructions (Signed)
Read the information below.  Use the prescribed medication as directed.  Please discuss all new medications with your pharmacist.  You may return to the Emergency Department at any time for worsening condition or any new symptoms that concern you.    If you develop fevers, loss of control of bowel or bladder, weakness or numbness in your legs, or are unable to walk, return to the ER for a recheck.  °

## 2016-07-02 NOTE — ED Provider Notes (Signed)
[facility]-EMERGENCY DEPT Provider Note   CSN: [id number] Arrival date & time: 07/02/16  1256  By signing my name below, I, Vista Mink, attest that this documentation has been prepared under the direction and in the presence of [facility] PA-C.  Electronically Signed: Vista Mink, ED Scribe. 07/02/16. 2:21 PM.  History   Chief Complaint Chief Complaint  Patient presents with  . Back Pain    HPI HPI Comments: Nancy Henson is a 21 y.o. female who presents to the Emergency Department complaining of constant, non-radiating bilateral lower back pain, onset 2 days ago. Pt was taking the lug nuts off of the wheels on her car and was pulling back on the torque wrench. A few hours later she started to have gradual onset generalized back pain. After falling asleep last night she was woken up due to the pain in her back. Pt tried one 200mg  ibuprofen with no relief and also states she took an 800mg  ibuprofen that she had leftover at home with no relief. She has not applied ice or heat to her back. Pt is able to ambulate and move all four extremities without difficulty. She denies any pain, weakness, numbness in bilateral upper or lower extremities.   The history is provided by the patient. No language interpreter was used.    Past Medical History:  Diagnosis Date  . Anemia   . Anxiety   . Depression   . Headache   . No pertinent past medical history   . Pregnancy     Patient Active Problem List   Diagnosis Date Noted  . 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 Cocaine abuse 06/19/2016  . Migraine headache without aura 11/16/2014  . Nexplanon in place 11/16/2014    Past Surgical History:  Procedure Laterality Date  . NO PAST SURGERIES      OB History    Gravida Para Term Preterm AB Living   3 3 3     3    SAB TAB Ectopic Multiple Live Births         0 3       Home Medications    Prior to Admission medications   Medication  Sig Start Date End Date Taking? Authorizing Provider  hydrOXYzine (ATARAX/VISTARIL) 25 MG tablet Take 1 tablet (25 mg total) by mouth every 6 (six) hours as needed for anxiety. 06/23/16   Armandina Stammer I, NP  lidocaine (LIDODERM) 5 % Place 1 patch onto the skin daily. Remove & Discard patch within 12 hours or as directed by MD 07/02/16   Trixie Dredge, PA-C  LORazepam (ATIVAN) 1 MG tablet Take 1 tablet (1 mg total) by mouth every 6 (six) hours as needed for anxiety. 06/23/16   Armandina Stammer I, NP  methocarbamol (ROBAXIN) 500 MG tablet Take 1-2 tablets (500-1,000 mg total) by mouth every 6 (six) hours as needed for muscle spasms. 07/02/16   Trixie Dredge, PA-C  nicotine polacrilex (NICORETTE) 2 MG gum Take 1 each (2 mg total) by mouth as needed for smoking cessation. 06/23/16   Armandina Stammer I, NP  risperiDONE (RISPERDAL) 1 MG tablet Take 1 tablet (1 mg total) by mouth at bedtime. For mood control 06/23/16   Armandina Stammer I, NP  sertraline (ZOLOFT) 50 MG tablet Take 1 tablet (50 mg total) by mouth daily. For depression 06/24/16   Armandina Stammer I, NP  traZODone (DESYREL) 100 MG tablet Take 1 tablet (100 mg total) by mouth at bedtime as needed  for sleep. 06/23/16   Sanjuana Kava, NP    Family History Family History  Problem Relation Age of Onset  . Alcohol abuse Neg Hx   . Arthritis Neg Hx   . Asthma Neg Hx   . Birth defects Neg Hx   . Cancer Neg Hx   . COPD Neg Hx   . Depression Neg Hx   . Diabetes Neg Hx   . Drug abuse Neg Hx   . Early death Neg Hx   . Hearing loss Neg Hx   . Heart disease Neg Hx   . Hyperlipidemia Neg Hx   . Hypertension Neg Hx   . Kidney disease Neg Hx   . Learning disabilities Neg Hx   . Mental illness Neg Hx   . Mental retardation Neg Hx   . Miscarriages / Stillbirths Neg Hx   . Stroke Neg Hx   . Vision loss Neg Hx   . Varicose Veins Neg Hx     Social History Social History  Substance Use Topics  . Smoking status: Current Every Day Smoker    Packs/day: 2.00    Types:  Cigarettes  . Smokeless tobacco: Never Used     Comment: refused  . Alcohol use 7.2 - 10.8 oz/week    12 - 18 Cans of beer per week   Allergies   Patient has no known allergies.   Review of Systems Review of Systems  Constitutional: Negative for fever.  Gastrointestinal: Negative for abdominal pain.  Musculoskeletal: Positive for back pain (bilateral). Negative for gait problem and neck pain.  Skin: Negative for wound.  Neurological: Negative for weakness and numbness.  Psychiatric/Behavioral: Negative for self-injury.     Physical Exam Updated Vital Signs BP 126/81 (BP Location: Left Arm)   Pulse (!) 108   Temp 98.4 F (36.9 C) (Oral)   Resp 18   SpO2 100%   Physical Exam  Constitutional: She appears well-developed and well-nourished. No distress.  HENT:  Head: Normocephalic and atraumatic.  Neck: Neck supple.  Pulmonary/Chest: Effort normal.  Abdominal: Soft. There is no tenderness.  Abdomen soft, non-tender.  Musculoskeletal: Normal range of motion. She exhibits tenderness. She exhibits no deformity.  TTP of entire back. No focal tenderness.   Neurological: She is alert.  Skin: She is not diaphoretic.  Nursing note and vitals reviewed.  ED Treatments / Results  DIAGNOSTIC STUDIES: Oxygen Saturation is 100% on RA, normal by my interpretation.  COORDINATION OF CARE: 2:37 PM-Discussed treatment plan with pt at bedside and pt agreed to plan.   Labs (all labs ordered are listed, but only abnormal results are displayed) Labs Reviewed - No data to display  EKG  EKG Interpretation None       Radiology No results found.  Procedures Procedures (including critical care time)  Medications Ordered in ED Medications - No data to display   Initial Impression / Assessment and Plan / ED Course  I have reviewed the triage vital signs and the nursing notes.  Pertinent labs & imaging results that were available during my care of the patient were reviewed by  me and considered in my medical decision making (see chart for details).     Afebrile, nontoxic patient with simple muscle soreness following attempting to change tire.  No focal tenderness, doubt fracture.  No red flags for back pain.   D/C home with symptomatic treatment.  Discussed result, findings, treatment, and follow up  with patient.  Pt given return precautions.  Pt verbalizes understanding and agrees with plan.       Final Clinical Impressions(s) / ED Diagnoses   Final diagnoses:  Muscle strain    New Prescriptions Discharge Medication List as of 07/02/2016  2:25 PM    START taking these medications   Details  lidocaine (LIDODERM) 5 % Place 1 patch onto the skin daily. Remove & Discard patch within 12 hours or as directed by MD, Starting Sun 07/02/2016, Print    methocarbamol (ROBAXIN) 500 MG tablet Take 1-2 tablets (500-1,000 mg total) by mouth every 6 (six) hours as needed for muscle spasms., Starting Sun 07/02/2016, Print        I personally performed the services described in this documentation, which was scribed in my presence. The recorded information has been reviewed and is accurate.    Trixie DredgeWest, Devika Dragovich, PA-C 07/02/16 1621    Arby BarrettePfeiffer, Marcy, MD 07/14/16 1538

## 2016-07-02 NOTE — ED Triage Notes (Signed)
Pt from home with c/o upper back pain. Pt states she was trying to change her tire and has upper and middle back pain following this. Pt states this occurred on Friday. Pt is ambulatory with no difficulty. Pt rates pain 10/10. Pt denies urinary symptoms.

## 2016-07-30 ENCOUNTER — Emergency Department (HOSPITAL_COMMUNITY)
Admission: EM | Admit: 2016-07-30 | Discharge: 2016-07-30 | Disposition: A | Discharge status: Home / Self Care | Payer: Medicaid Other | Attending: Emergency Medicine | Admitting: Emergency Medicine

## 2016-07-30 ENCOUNTER — Emergency Department (HOSPITAL_COMMUNITY): Payer: Medicaid Other

## 2016-07-30 ENCOUNTER — Encounter (HOSPITAL_COMMUNITY): Payer: Self-pay | Admitting: Emergency Medicine

## 2016-07-30 DIAGNOSIS — F10121 Alcohol abuse with intoxication delirium: Secondary | ICD-10-CM | POA: Insufficient documentation

## 2016-07-30 DIAGNOSIS — Y9389 Activity, other specified: Secondary | ICD-10-CM | POA: Diagnosis not present

## 2016-07-30 DIAGNOSIS — F329 Major depressive disorder, single episode, unspecified: Secondary | ICD-10-CM | POA: Insufficient documentation

## 2016-07-30 DIAGNOSIS — F1410 Cocaine abuse, uncomplicated: Secondary | ICD-10-CM | POA: Insufficient documentation

## 2016-07-30 DIAGNOSIS — F419 Anxiety disorder, unspecified: Secondary | ICD-10-CM | POA: Insufficient documentation

## 2016-07-30 DIAGNOSIS — R410 Disorientation, unspecified: Secondary | ICD-10-CM

## 2016-07-30 DIAGNOSIS — F14121 Cocaine abuse with intoxication with delirium: Secondary | ICD-10-CM | POA: Insufficient documentation

## 2016-07-30 DIAGNOSIS — Y998 Other external cause status: Secondary | ICD-10-CM | POA: Diagnosis not present

## 2016-07-30 DIAGNOSIS — S61511A Laceration without foreign body of right wrist, initial encounter: Secondary | ICD-10-CM | POA: Insufficient documentation

## 2016-07-30 DIAGNOSIS — F17210 Nicotine dependence, cigarettes, uncomplicated: Secondary | ICD-10-CM | POA: Insufficient documentation

## 2016-07-30 DIAGNOSIS — Z79899 Other long term (current) drug therapy: Secondary | ICD-10-CM | POA: Insufficient documentation

## 2016-07-30 DIAGNOSIS — Y929 Unspecified place or not applicable: Secondary | ICD-10-CM | POA: Diagnosis not present

## 2016-07-30 DIAGNOSIS — F1010 Alcohol abuse, uncomplicated: Secondary | ICD-10-CM

## 2016-07-30 DIAGNOSIS — R4182 Altered mental status, unspecified: Secondary | ICD-10-CM | POA: Diagnosis present

## 2016-07-30 LAB — RAPID URINE DRUG SCREEN, HOSP PERFORMED
AMPHETAMINES: NOT DETECTED
BARBITURATES: NOT DETECTED
BENZODIAZEPINES: NOT DETECTED
Cocaine: POSITIVE — AB
Opiates: NOT DETECTED
TETRAHYDROCANNABINOL: NOT DETECTED

## 2016-07-30 LAB — COMPREHENSIVE METABOLIC PANEL
ALBUMIN: 4.6 g/dL (ref 3.5–5.0)
ALT: 19 U/L (ref 14–54)
AST: 20 U/L (ref 15–41)
Alkaline Phosphatase: 90 U/L (ref 38–126)
Anion gap: 11 (ref 5–15)
BUN: 5 mg/dL — AB (ref 6–20)
CHLORIDE: 105 mmol/L (ref 101–111)
CO2: 21 mmol/L — ABNORMAL LOW (ref 22–32)
Calcium: 9.3 mg/dL (ref 8.9–10.3)
Creatinine, Ser: 0.74 mg/dL (ref 0.44–1.00)
GFR calc Af Amer: >60 mL/min (ref >60)
GFR calc non Af Amer: >60 mL/min (ref >60)
GLUCOSE: 131 mg/dL — AB (ref 65–99)
Potassium: 3.3 mmol/L — ABNORMAL LOW (ref 3.5–5.1)
Sodium: 137 mmol/L (ref 135–145)
Total Bilirubin: 0.5 mg/dL (ref 0.3–1.2)
Total Protein: 7.7 g/dL (ref 6.5–8.1)

## 2016-07-30 LAB — I-STAT ARTERIAL BLOOD GAS, ED
ACID-BASE DEFICIT: 7 mmol/L — AB (ref 0.0–2.0)
Bicarbonate: 17.8 mmol/L — ABNORMAL LOW (ref 20.0–28.0)
O2 SAT: 97 %
TCO2: 19 mmol/L (ref 0–100)
pCO2 arterial: 33.4 mmHg (ref 32.0–48.0)
pH, Arterial: 7.335 — ABNORMAL LOW (ref 7.350–7.450)
pO2, Arterial: 101 mmHg (ref 83.0–108.0)

## 2016-07-30 LAB — ACETAMINOPHEN LEVEL: Acetaminophen (Tylenol), Serum: <10 ug/mL — ABNORMAL LOW (ref 10–30)

## 2016-07-30 LAB — CBC WITH DIFFERENTIAL/PLATELET
Basophils Absolute: 0.1 10*3/uL (ref 0.0–0.1)
Basophils Relative: 1 %
EOS PCT: 1 %
Eosinophils Absolute: 0.1 10*3/uL (ref 0.0–0.7)
HEMATOCRIT: 43.7 % (ref 36.0–46.0)
Hemoglobin: 15.3 g/dL — ABNORMAL HIGH (ref 12.0–15.0)
LYMPHS ABS: 1.9 10*3/uL (ref 0.7–4.0)
LYMPHS PCT: 18 %
MCH: 32.1 pg (ref 26.0–34.0)
MCHC: 35 g/dL (ref 30.0–36.0)
MCV: 91.8 fL (ref 78.0–100.0)
MONO ABS: 0.4 10*3/uL (ref 0.1–1.0)
Monocytes Relative: 4 %
Neutro Abs: 8 10*3/uL — ABNORMAL HIGH (ref 1.7–7.7)
Neutrophils Relative %: 76 %
Platelets: 301 10*3/uL (ref 150–400)
RBC: 4.76 MIL/uL (ref 3.87–5.11)
RDW: 12.7 % (ref 11.5–15.5)
WBC: 10.4 10*3/uL (ref 4.0–10.5)

## 2016-07-30 LAB — CK: Total CK: 154 U/L (ref 38–234)

## 2016-07-30 LAB — I-STAT BETA HCG BLOOD, ED (MC, WL, AP ONLY): I-stat hCG, quantitative: <5 m[IU]/mL (ref <5)

## 2016-07-30 LAB — SALICYLATE LEVEL: Salicylate Lvl: <7 mg/dL (ref 2.8–30.0)

## 2016-07-30 LAB — ETHANOL: ALCOHOL ETHYL (B): 134 mg/dL — AB (ref <5)

## 2016-07-30 MED ORDER — LORAZEPAM 2 MG/ML IJ SOLN
INTRAMUSCULAR | Status: AC
  Administered 2016-07-30: 2 mg via INTRAVENOUS
  Filled 2016-07-30: qty 1

## 2016-07-30 MED ORDER — BACITRACIN ZINC 500 UNIT/GM EX OINT: 1.0000 "application " | TOPICAL_OINTMENT | Freq: Two times a day (BID) | CUTANEOUS | 0 refills | Status: DC

## 2016-07-30 MED ORDER — SODIUM CHLORIDE 0.9 % IV BOLUS (SEPSIS)
1000.0000 mL | Freq: Once | INTRAVENOUS | Status: AC
  Administered 2016-07-30: 1000 mL via INTRAVENOUS

## 2016-07-30 MED ORDER — HALOPERIDOL LACTATE 5 MG/ML IJ SOLN
INTRAMUSCULAR | Status: AC
  Administered 2016-07-30: 5 mg via INTRAMUSCULAR
  Filled 2016-07-30: qty 1

## 2016-07-30 NOTE — ED Triage Notes (Signed)
Patient pulled from door way to waiting room, patient being held up by family member.  Patient with eyes closed, when trying to open by RN, eyes fluttering and resistance felt to keep eyes closed.  Patient would intermittently awake to flail on bed and punch and kick at staff.  Patient with small laceration at right wrist, no bleeding at this time.

## 2016-07-30 NOTE — ED Notes (Signed)
Patient sleeping soundly at this time, snoring noted, friend at bedside
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
## 2016-07-30 NOTE — ED Notes (Signed)
Patient transported to CT 
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
## 2017-03-12 ENCOUNTER — Encounter: Payer: Self-pay | Admitting: *Deleted

## 2017-04-27 ENCOUNTER — Encounter (HOSPITAL_COMMUNITY): Payer: Self-pay | Admitting: Emergency Medicine

## 2017-04-27 ENCOUNTER — Emergency Department (HOSPITAL_COMMUNITY)
Admission: EM | Admit: 2017-04-27 | Discharge: 2017-04-27 | Disposition: A | Discharge status: Home / Self Care | Payer: Medicaid Other | Attending: Emergency Medicine | Admitting: Emergency Medicine

## 2017-04-27 DIAGNOSIS — Z79899 Other long term (current) drug therapy: Secondary | ICD-10-CM | POA: Insufficient documentation

## 2017-04-27 DIAGNOSIS — F17210 Nicotine dependence, cigarettes, uncomplicated: Secondary | ICD-10-CM | POA: Insufficient documentation

## 2017-04-27 DIAGNOSIS — J020 Streptococcal pharyngitis: Secondary | ICD-10-CM

## 2017-04-27 LAB — GROUP A STREP BY PCR: Group A Strep by PCR: DETECTED — AB

## 2017-04-27 MED ORDER — PREDNISONE 20 MG PO TABS
60.0000 mg | ORAL_TABLET | Freq: Once | ORAL | Status: AC
  Administered 2017-04-27: 60 mg via ORAL
  Filled 2017-04-27: qty 3

## 2017-04-27 MED ORDER — PENICILLIN G BENZATHINE 1200000 UNIT/2ML IM SUSP
1.2000 10*6.[IU] | Freq: Once | INTRAMUSCULAR | Status: AC
  Administered 2017-04-27: 1.2 10*6.[IU] via INTRAMUSCULAR
  Filled 2017-04-27: qty 2

## 2017-04-27 MED ORDER — PREDNISONE 20 MG PO TABS: 40.0000 mg | ORAL_TABLET | Freq: Every day | ORAL | 0 refills | Status: DC

## 2017-04-27 MED ORDER — PENICILLIN G BENZATHINE & PROC 1200000 UNIT/2ML IM SUSP: 1.2000 10*6.[IU] | Freq: Once | INTRAMUSCULAR | Status: DC

## 2017-04-27 NOTE — Discharge Instructions (Addendum)
Continue tylenol or ibuprofen for pain.  Continue salt water gargles.  You were treated today for the infection with a shot of penicillin.  Make sure to drink plenty of fluids.  If your symptoms are worsening come back to emergency department for recheck.

## 2017-04-27 NOTE — ED Triage Notes (Signed)
Pt reports since last night started having sore throat. Son reports that he having same couple days before her.

## 2017-04-27 NOTE — ED Provider Notes (Signed)
[facility]-EMERGENCY DEPT Provider Note   CSN: [id number] Arrival date & time: 04/27/17  1025     History   Chief Complaint Chief Complaint  Patient presents with  . Sore Throat    HPI Nancy Henson is a 22 y.o. female.  HPI Nancy Henson is a 22 y.o. female presents to ED with complaint of sore throat. States started yesterday. Pain is sharp, difficult swallowing. Pain is 9/10. Tried salt water gargles, tea. Denies nasal congestion. No cough. No fever or chills. No medications tried prior to coming in. States kids recently sick with similar symptoms.   Past Medical History:  Diagnosis Date  . Anemia   . Anxiety   . Depression   . Headache   . No pertinent past medical history   . Pregnancy     Patient Active Problem List   Diagnosis Date Noted  . 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 Cocaine abuse (HCC) 06/19/2016  . Migraine headache without aura 11/16/2014  . Nexplanon in place 11/16/2014    Past Surgical History:  Procedure Laterality Date  . NO PAST SURGERIES       OB History    Gravida  3   Para  3   Term  3   Preterm      AB      Living  3     SAB      TAB      Ectopic      Multiple  0   Live Births  3            Home Medications    Prior to Admission medications   Medication Sig Start Date End Date Taking? Authorizing Provider  bacitracin ointment Apply 1 application topically 2 (two) times daily. Cuts and abrasions 07/30/16   Shaune Pollack, MD  hydrOXYzine (ATARAX/VISTARIL) 25 MG tablet Take 1 tablet (25 mg total) by mouth every 6 (six) hours as needed for anxiety. 06/23/16   Armandina Stammer I, NP  LORazepam (ATIVAN) 1 MG tablet Take 1 tablet (1 mg total) by mouth every 6 (six) hours as needed for anxiety. Patient taking differently: Take 1 mg by mouth 2 (two) times daily.  06/23/16   Armandina Stammer I, NP  methocarbamol (ROBAXIN) 500 MG tablet Take  1-2 tablets (500-1,000 mg total) by mouth every 6 (six) hours as needed for muscle spasms. 07/02/16   Trixie Dredge, PA-C  nicotine polacrilex (NICORETTE) 2 MG gum Take 1 each (2 mg total) by mouth as needed for smoking cessation. 06/23/16   Armandina Stammer I, NP  risperiDONE (RISPERDAL) 1 MG tablet Take 1 tablet (1 mg total) by mouth at bedtime. For mood control 06/23/16   Armandina Stammer I, NP  sertraline (ZOLOFT) 50 MG tablet Take 1 tablet (50 mg total) by mouth daily. For depression 06/24/16   Armandina Stammer I, NP  traZODone (DESYREL) 100 MG tablet Take 1 tablet (100 mg total) by mouth at bedtime as needed for sleep. 06/23/16   Sanjuana Kava, NP    Family History Family History  Problem Relation Age of Onset  . Alcohol abuse Neg Hx   . Arthritis Neg Hx   . Asthma Neg Hx   . Birth defects Neg Hx   . Cancer Neg Hx   . COPD Neg Hx   . Depression Neg Hx   . Diabetes Neg Hx   . Drug abuse Neg Hx   .  Early death Neg Hx   . Hearing loss Neg Hx   . Heart disease Neg Hx   . Hyperlipidemia Neg Hx   . Hypertension Neg Hx   . Kidney disease Neg Hx   . Learning disabilities Neg Hx   . Mental illness Neg Hx   . Mental retardation Neg Hx   . Miscarriages / Stillbirths Neg Hx   . Stroke Neg Hx   . Vision loss Neg Hx   . Varicose Veins Neg Hx     Social History Social History   Tobacco Use  . Smoking status: Current Every Day Smoker    Packs/day: 2.00    Types: Cigarettes  . Smokeless tobacco: Never Used  . Tobacco comment: refused  Substance Use Topics  . Alcohol use: Yes    Alcohol/week: 7.2 - 10.8 oz    Types: 12 - 18 Cans of beer per week  . Drug use: No    Types: Cocaine, Marijuana     Allergies   Patient has no known allergies.   Review of Systems Review of Systems  Constitutional: Negative for chills and fever.  HENT: Positive for sore throat. Negative for congestion and ear pain.   Respiratory: Negative for chest tightness and shortness of breath.   Cardiovascular: Negative  for chest pain.  Gastrointestinal: Negative for abdominal pain, nausea and vomiting.  Neurological: Positive for headaches.  All other systems reviewed and are negative.    Physical Exam Updated Vital Signs BP 119/73 (BP Location: Left Arm)   Pulse 97   Temp 98.3 F (36.8 C) (Oral)   Resp 14   SpO2 99%   Physical Exam  Constitutional: She appears well-developed and well-nourished. No distress.  HENT:  Head: Normocephalic and atraumatic.  Right Ear: Tympanic membrane, external ear and ear canal normal.  Left Ear: Tympanic membrane, external ear and ear canal normal.  Nose: Nose normal.  Mouth/Throat: Uvula is midline. No trismus in the jaw. No uvula swelling. Oropharyngeal exudate and posterior oropharyngeal erythema present. Tonsils are 3+ on the right. Tonsils are 3+ on the left.  Eyes: Conjunctivae are normal.  Neck: Neck supple.  Neurological: She is alert.  Skin: Skin is warm and dry.  Nursing note and vitals reviewed.    ED Treatments / Results  Labs (all labs ordered are listed, but only abnormal results are displayed) Labs Reviewed  GROUP A STREP BY PCR - Abnormal; Notable for the following components:      Result Value   Group A Strep by PCR DETECTED (*)    All other components within normal limits    EKG None  Radiology No results found.  Procedures Procedures (including critical care time)  Medications Ordered in ED Medications - No data to display   Initial Impression / Assessment and Plan / ED Course  I have reviewed the triage vital signs and the nursing notes.  Pertinent labs & imaging results that were available during my care of the patient were reviewed by me and considered in my medical decision making (see chart for details).     Patient with exudative pharyngitis since yesterday.  Afebrile, nontoxic-appearing.  Normal vital signs.   strep is positive.  Patient is concerned because she does not think she would be able to swallow the  pills at home, although she is swallowing here with no difficulty.  Will give a shot of Bicillin IM and will discharge home with some steroids.  Return precautions discussed.  Vitals:  04/27/17 1049  BP: 119/73  Pulse: 97  Resp: 14  Temp: 98.3 F (36.8 C)  TempSrc: Oral  SpO2: 99%     Final Clinical Impressions(s) / ED Diagnoses   Final diagnoses:  Strep pharyngitis    ED Discharge Orders        Ordered    predniSONE (DELTASONE) 20 MG tablet  Daily     04/27/17 1528       Jaynie CrumbleKirichenko, Danita Proud, PA-C 04/27/17 1530    Shaune PollackIsaacs, Cameron, MD 04/28/17 0830

## 2017-05-20 ENCOUNTER — Encounter (HOSPITAL_COMMUNITY): Payer: Self-pay | Admitting: *Deleted

## 2017-05-20 ENCOUNTER — Inpatient Hospital Stay (HOSPITAL_COMMUNITY)
Admission: AD | Admit: 2017-05-20 | Discharge: 2017-05-20 | Disposition: A | Discharge status: Home / Self Care | Payer: Self-pay | Source: Ambulatory Visit | Attending: Obstetrics & Gynecology | Admitting: Obstetrics & Gynecology

## 2017-05-20 DIAGNOSIS — F17210 Nicotine dependence, cigarettes, uncomplicated: Secondary | ICD-10-CM | POA: Insufficient documentation

## 2017-05-20 DIAGNOSIS — R109 Unspecified abdominal pain: Secondary | ICD-10-CM | POA: Insufficient documentation

## 2017-05-20 DIAGNOSIS — F329 Major depressive disorder, single episode, unspecified: Secondary | ICD-10-CM | POA: Insufficient documentation

## 2017-05-20 DIAGNOSIS — Z79899 Other long term (current) drug therapy: Secondary | ICD-10-CM | POA: Insufficient documentation

## 2017-05-20 DIAGNOSIS — F419 Anxiety disorder, unspecified: Secondary | ICD-10-CM | POA: Insufficient documentation

## 2017-05-20 DIAGNOSIS — Z7952 Long term (current) use of systemic steroids: Secondary | ICD-10-CM | POA: Insufficient documentation

## 2017-05-20 DIAGNOSIS — Z3202 Encounter for pregnancy test, result negative: Secondary | ICD-10-CM | POA: Insufficient documentation

## 2017-05-20 LAB — URINALYSIS, ROUTINE W REFLEX MICROSCOPIC
Bilirubin Urine: NEGATIVE
GLUCOSE, UA: NEGATIVE mg/dL
HGB URINE DIPSTICK: NEGATIVE
Ketones, ur: NEGATIVE mg/dL
Leukocytes, UA: NEGATIVE
Nitrite: NEGATIVE
Protein, ur: NEGATIVE mg/dL
SPECIFIC GRAVITY, URINE: 1.02 (ref 1.005–1.030)
pH: 6 (ref 5.0–8.0)

## 2017-05-20 LAB — HCG, QUANTITATIVE, PREGNANCY

## 2017-05-20 NOTE — MAU Note (Signed)
POC urine pregnancy  Negative  

## 2017-05-20 NOTE — MAU Note (Signed)
Patient reports  She has Nexplanon  Had a +HPT States does not have periods; occasional spotting  +lower left abdominal pain Rating pain 4/10 Has not taken anything for pain "just a discomfort"  Finished antibiotics last week for BV

## 2017-05-20 NOTE — MAU Provider Note (Signed)
History     CSN: [id number]  Arrival date and time: 05/20/17 4098   First Provider Initiated Contact with Patient 05/20/17 1009      Chief Complaint  Patient presents with  . Abdominal Pain  . Possible Pregnancy   HPI  Ms.  Nancy Henson is a 22 y.o. year old [id number] female who presents to MAU reporting (+) HPT and occasional vaginal spotting, lower LT abd pain (rated 4/10); "discomfort". She reports she was seen recently at [facility] where she was dx'd with BV; she finished abx last week. She denies any VB or abnormal vaginal d/c at this time.  Past Medical History:  Diagnosis Date  . Anemia   . Anxiety   . Depression   . Headache   . No pertinent past medical history   . Pregnancy     Past Surgical History:  Procedure Laterality Date  . NO PAST SURGERIES      Family History  Problem Relation Age of Onset  . Alcohol abuse Neg Hx   . Arthritis Neg Hx   . Asthma Neg Hx   . Birth defects Neg Hx   . Cancer Neg Hx   . COPD Neg Hx   . Depression Neg Hx   . Diabetes Neg Hx   . Drug abuse Neg Hx   . Early death Neg Hx   . Hearing loss Neg Hx   . Heart disease Neg Hx   . Hyperlipidemia Neg Hx   . Hypertension Neg Hx   . Kidney disease Neg Hx   . Learning disabilities Neg Hx   . Mental illness Neg Hx   . Mental retardation Neg Hx   . Miscarriages / Stillbirths Neg Hx   . Stroke Neg Hx   . Vision loss Neg Hx   . Varicose Veins Neg Hx     Social History   Tobacco Use  . Smoking status: Current Every Day Smoker    Packs/day: 2.00    Types: Cigarettes  . Smokeless tobacco: Never Used  . Tobacco comment: refused  Substance Use Topics  . Alcohol use: Yes    Alcohol/week: 7.2 - 10.8 oz    Types: 12 - 18 Cans of beer per week  . Drug use: No    Types: Cocaine, Marijuana    Allergies: No Known Allergies  Medications Prior to Admission  Medication Sig Dispense Refill Last Dose  . bacitracin ointment Apply 1 application topically 2 (two) times  daily. Cuts and abrasions 120 g 0 Past Month at Unknown time  . clonazePAM (KLONOPIN) 0.5 MG tablet Take 0.5 mg by mouth 2 (two) times daily as needed for anxiety.   Past Month at Unknown time  . hydrOXYzine (ATARAX/VISTARIL) 25 MG tablet Take 1 tablet (25 mg total) by mouth every 6 (six) hours as needed for anxiety. (Patient not taking: Reported on 04/27/2017) 60 tablet 0 Not Taking at Unknown time  . LORazepam (ATIVAN) 1 MG tablet Take 1 tablet (1 mg total) by mouth every 6 (six) hours as needed for anxiety. (Patient not taking: Reported on 04/27/2017) 12 tablet 0 Not Taking at Unknown time  . methocarbamol (ROBAXIN) 500 MG tablet Take 1-2 tablets (500-1,000 mg total) by mouth every 6 (six) hours as needed for muscle spasms. (Patient not taking: Reported on 04/27/2017) 10 tablet 0 Completed Course at Unknown time  . nicotine polacrilex (NICORETTE) 2 MG gum Take 1 each (2 mg total) by mouth as needed for smoking cessation. (Patient  not taking: Reported on 04/27/2017) 100 tablet 0 Not Taking at Unknown time  . predniSONE (DELTASONE) 20 MG tablet Take 2 tablets (40 mg total) by mouth daily. 8 tablet 0   . risperiDONE (RISPERDAL) 1 MG tablet Take 1 tablet (1 mg total) by mouth at bedtime. For mood control (Patient not taking: Reported on 04/27/2017) 30 tablet 0 Not Taking at Unknown time  . sertraline (ZOLOFT) 50 MG tablet Take 1 tablet (50 mg total) by mouth daily. For depression (Patient not taking: Reported on 04/27/2017) 30 tablet 0 Not Taking at Unknown time  . traZODone (DESYREL) 100 MG tablet Take 1 tablet (100 mg total) by mouth at bedtime as needed for sleep. (Patient not taking: Reported on 04/27/2017) 30 tablet 0 Not Taking at Unknown time    Review of Systems  Constitutional: Negative.   HENT: Negative.   Eyes: Negative.   Respiratory: Negative.   Cardiovascular: Negative.   Gastrointestinal: Negative.   Endocrine: Negative.   Genitourinary: Negative.   Musculoskeletal: Negative.   Skin:  Negative.   Allergic/Immunologic: Negative.   Neurological: Negative.   Hematological: Negative.   Psychiatric/Behavioral: Negative.    Physical Exam   Blood pressure 126/81, pulse 96, temperature 98.2 F (36.8 C), temperature source Oral, resp. rate 17, weight 157 lb 1.9 oz (71.3 kg), SpO2 100 %.  Physical Exam  Nursing note and vitals reviewed. Constitutional: She is oriented to person, place, and time. She appears well-developed and well-nourished.  HENT:  Head: Normocephalic and atraumatic.  Eyes: Pupils are equal, round, and reactive to light.  Neck: Normal range of motion.  Cardiovascular: Normal rate, regular rhythm and normal heart sounds.  Respiratory: Effort normal and breath sounds normal.  GI: Soft. Bowel sounds are normal.  Genitourinary:  Genitourinary Comments: Pelvic deferred  Musculoskeletal: Normal range of motion.  Neurological: She is alert and oriented to person, place, and time.  Skin: Skin is warm and dry.  Psychiatric: She has a normal mood and affect. Her behavior is normal. Judgment and thought content normal.    MAU Course  Procedures  MDM CCUA UPT HCG  Results for orders placed or performed during the hospital encounter of 05/20/17 (from the past 24 hour(s))  Urinalysis, Routine w reflex microscopic     Status: None   Collection Time: 05/20/17  9:34 AM  Result Value Ref Range   Color, Urine YELLOW YELLOW   APPearance CLEAR CLEAR   Specific Gravity, Urine 1.020 1.005 - 1.030   pH 6.0 5.0 - 8.0   Glucose, UA NEGATIVE NEGATIVE mg/dL   Hgb urine dipstick NEGATIVE NEGATIVE   Bilirubin Urine NEGATIVE NEGATIVE   Ketones, ur NEGATIVE NEGATIVE mg/dL   Protein, ur NEGATIVE NEGATIVE mg/dL   Nitrite NEGATIVE NEGATIVE   Leukocytes, UA NEGATIVE NEGATIVE  hCG, quantitative, pregnancy     Status: None   Collection Time: 05/20/17  9:56 AM  Result Value Ref Range   hCG, Beta Chain, Quant, S <1 <5 mIU/mL    Assessment and Plan  Negative pregnancy  test - Plan: Discharge patient - Advised to f/u with [facility] for GYN needs - MAU GYN care changes provided - Discharge home - Patient verbalized an understanding of the plan of care and agrees.   Raelyn Mora, MSN, CNM 05/20/2017, 10:15 AM

## 2017-05-20 NOTE — Discharge Instructions (Signed)
In 2020, the [facility] will be moving to the [place] campus. At that time, the MAU (Maternity Admissions Unit), where you are being seen today, will no longer take care of non-pregnant patients. We strongly encourage you to find a doctor's office before that time, so that you can be seen with any GYN concerns, like vaginal discharge, urinary tract infection, etc.. in a timely manner. ° °In order to make an office visit more convenient, the [facility] will be offering evening hours with same-day appointments, walk-in appointments and scheduled appointments available during this time. ° °[facility] Hours: °Monday - 8am - 7:30 pm with walk-in between 4pm- 7:30 pm °Tuesday - 8 am - 5 pm (starting 04/10/17 we will be open late and accepting walk-ins from 4pm - 7:30pm) °Wednesday - 8 am - 5 pm (starting 07/11/17 we will be open late and accepting walk-ins from 4pm - 7:30pm) °Thursday 8 am - 5 pm (starting 10/11/17 we will be open late and accepting walk-ins from 4pm - 7:30pm) °Friday 8 am - 5 pm ° °For an appointment please call the [facility] at [phone] ° °For urgent needs, [facility] is also available for management of urgent GYN complaints such as vaginal discharge or urinary tract infections. ° ° ° ° ° °

## 2017-05-21 LAB — POCT PREGNANCY, URINE: Preg Test, Ur: NEGATIVE

## 2017-05-30 ENCOUNTER — Other Ambulatory Visit: Payer: Self-pay

## 2017-05-30 ENCOUNTER — Encounter (HOSPITAL_COMMUNITY): Payer: Self-pay

## 2017-05-30 ENCOUNTER — Emergency Department (HOSPITAL_COMMUNITY)
Admission: EM | Admit: 2017-05-30 | Discharge: 2017-05-30 | Disposition: A | Discharge status: Home / Self Care | Payer: Self-pay | Attending: Emergency Medicine | Admitting: Emergency Medicine

## 2017-05-30 DIAGNOSIS — F17210 Nicotine dependence, cigarettes, uncomplicated: Secondary | ICD-10-CM | POA: Insufficient documentation

## 2017-05-30 DIAGNOSIS — Z79899 Other long term (current) drug therapy: Secondary | ICD-10-CM | POA: Insufficient documentation

## 2017-05-30 DIAGNOSIS — J069 Acute upper respiratory infection, unspecified: Secondary | ICD-10-CM | POA: Insufficient documentation

## 2017-05-30 DIAGNOSIS — H66002 Acute suppurative otitis media without spontaneous rupture of ear drum, left ear: Secondary | ICD-10-CM | POA: Insufficient documentation

## 2017-05-30 MED ORDER — AMOXICILLIN 500 MG PO CAPS: 500.0000 mg | ORAL_CAPSULE | Freq: Three times a day (TID) | ORAL | 0 refills | Status: DC

## 2017-05-30 MED ORDER — ACETAMINOPHEN 325 MG PO TABS
650.0000 mg | ORAL_TABLET | Freq: Once | ORAL | Status: AC
  Administered 2017-05-30: 650 mg via ORAL
  Filled 2017-05-30: qty 2

## 2017-05-30 MED ORDER — PSEUDOEPHEDRINE HCL 30 MG PO TABS: 30.0000 mg | ORAL_TABLET | ORAL | 0 refills | Status: DC | PRN

## 2017-05-30 NOTE — Discharge Instructions (Addendum)
Take amoxicillin as prescribed until all gone.  Please take Sudafed as prescribed for congestion.  Take Tylenol or Motrin on a regular basis for fever, body aches, pain.  Please follow-up with family doctor if not improving.

## 2017-05-30 NOTE — ED Triage Notes (Signed)
Left earache and sore throat for a couple of days no drooling noted clear speech noted greenish nasal drainage.

## 2017-05-30 NOTE — ED Provider Notes (Signed)
[facility]-EMERGENCY DEPT Provider Note   CSN: [id number] Arrival date & time: 05/30/17  2005     History   Chief Complaint Chief Complaint  Patient presents with  . Otalgia    HPI Nancy Henson is a 22 y.o. female.  HPI Nancy Henson is a 22 y.o. female with history of anemia, headache, presents to emergency department with complaint of left ear pain, congestion, cough.  She reports low-grade fever.  States symptoms started yesterday.  Denies any sick contacts.  Denies any difficulty swallowing.  States main complaint is her left ear eating.  She denies drainage from the ear.  She took ibuprofen yesterday, did not take any medications today.  She reports body aches and chills.  Reports generalized weakness.  Denies any neck pain or stiffness.  No nausea or vomiting.  No diarrhea.  No other complaints  Past Medical History:  Diagnosis Date  . Anemia   . Anxiety   . Depression   . Headache   . No pertinent past medical history   . Pregnancy     Patient Active Problem List   Diagnosis Date Noted  . Negative pregnancy test 05/20/2017  . 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 Cocaine abuse (HCC) 06/19/2016  . Migraine headache without aura 11/16/2014  . Nexplanon in place 11/16/2014    Past Surgical History:  Procedure Laterality Date  . NO PAST SURGERIES       OB History    Gravida  3   Para  3   Term  3   Preterm      AB      Living  3     SAB      TAB      Ectopic      Multiple  0   Live Births  3            Home Medications    Prior to Admission medications   Medication Sig Start Date End Date Taking? Authorizing Provider  bacitracin ointment Apply 1 application topically 2 (two) times daily. Cuts and abrasions 07/30/16   Shaune Pollack, MD  clonazePAM (KLONOPIN) 0.5 MG tablet Take 0.5 mg by mouth 2 (two) times daily as needed for anxiety.    [provider]  hydrOXYzine (ATARAX/VISTARIL) 25 MG tablet Take 1 tablet (25 mg total) by mouth every 6 (six) hours as needed for anxiety. Patient not taking: Reported on 04/27/2017 06/23/16   Armandina Stammer I, NP  LORazepam (ATIVAN) 1 MG tablet Take 1 tablet (1 mg total) by mouth every 6 (six) hours as needed for anxiety. Patient not taking: Reported on 04/27/2017 06/23/16   Armandina Stammer I, NP  methocarbamol (ROBAXIN) 500 MG tablet Take 1-2 tablets (500-1,000 mg total) by mouth every 6 (six) hours as needed for muscle spasms. Patient not taking: Reported on 04/27/2017 07/02/16   Trixie Dredge, PA-C  nicotine polacrilex (NICORETTE) 2 MG gum Take 1 each (2 mg total) by mouth as needed for smoking cessation. Patient not taking: Reported on 04/27/2017 06/23/16   Armandina Stammer I, NP  predniSONE (DELTASONE) 20 MG tablet Take 2 tablets (40 mg total) by mouth daily. 04/27/17   Hortense Cantrall, PA-C  risperiDONE (RISPERDAL) 1 MG tablet Take 1 tablet (1 mg total) by mouth at bedtime. For mood control Patient not taking: Reported on 04/27/2017 06/23/16   Armandina Stammer I, NP  sertraline (ZOLOFT) 50 MG tablet Take 1 tablet (  50 mg total) by mouth daily. For depression Patient not taking: Reported on 04/27/2017 06/24/16   Armandina Stammer I, NP  traZODone (DESYREL) 100 MG tablet Take 1 tablet (100 mg total) by mouth at bedtime as needed for sleep. Patient not taking: Reported on 04/27/2017 06/23/16   Sanjuana Kava, NP    Family History Family History  Problem Relation Age of Onset  . Alcohol abuse Neg Hx   . Arthritis Neg Hx   . Asthma Neg Hx   . Birth defects Neg Hx   . Cancer Neg Hx   . COPD Neg Hx   . Depression Neg Hx   . Diabetes Neg Hx   . Drug abuse Neg Hx   . Early death Neg Hx   . Hearing loss Neg Hx   . Heart disease Neg Hx   . Hyperlipidemia Neg Hx   . Hypertension Neg Hx   . Kidney disease Neg Hx   . Learning disabilities Neg Hx   . Mental illness Neg Hx   . Mental retardation Neg Hx   .  Miscarriages / Stillbirths Neg Hx   . Stroke Neg Hx   . Vision loss Neg Hx   . Varicose Veins Neg Hx     Social History Social History   Tobacco Use  . Smoking status: Current Every Day Smoker    Packs/day: 2.00    Types: Cigarettes  . Smokeless tobacco: Never Used  . Tobacco comment: refused  Substance Use Topics  . Alcohol use: Yes    Alcohol/week: 7.2 - 10.8 oz    Types: 12 - 18 Cans of beer per week  . Drug use: Yes    Types: Cocaine, Marijuana     Allergies   Patient has no known allergies.   Review of Systems Review of Systems  Constitutional: Positive for chills and fever.  HENT: Positive for ear pain and sore throat.   Respiratory: Positive for cough. Negative for chest tightness and shortness of breath.   Cardiovascular: Negative for chest pain, palpitations and leg swelling.  Gastrointestinal: Negative for abdominal pain, diarrhea, nausea and vomiting.  Genitourinary: Negative for dysuria, flank pain, pelvic pain, vaginal bleeding, vaginal discharge and vaginal pain.  Musculoskeletal: Positive for myalgias. Negative for arthralgias, neck pain and neck stiffness.  Skin: Negative for rash.  Neurological: Positive for headaches. Negative for dizziness and weakness.  All other systems reviewed and are negative.    Physical Exam Updated Vital Signs BP 127/80 (BP Location: Right Arm)   Pulse (!) 118   Temp 99.5 F (37.5 C) (Oral)   Resp 19   Ht  (1.6 m)   Wt 70.3 kg (155 lb)   SpO2 100%   BMI 27.46 kg/m   Physical Exam  Constitutional: She is oriented to person, place, and time. She appears well-developed and well-nourished. No distress.  HENT:  Head: Normocephalic.  Oropharynx is erythematous, no exudate, uvula is midline.  Right ear canal, TM, external ear normal.  Left external ear and ear canal normal, erythema of tympanic membrane present with bulging.  Eyes: Conjunctivae are normal.  Neck: Neck supple.  Cardiovascular: Normal rate, regular  rhythm and normal heart sounds.  Pulmonary/Chest: Effort normal and breath sounds normal. No respiratory distress. She has no wheezes. She has no rales.  Abdominal: Soft. Bowel sounds are normal. She exhibits no distension. There is no tenderness. There is no rebound.  Musculoskeletal: She exhibits no edema.  Neurological: She is alert and oriented  to person, place, and time.  Skin: Skin is warm and dry.  Psychiatric: She has a normal mood and affect. Her behavior is normal.  Nursing note and vitals reviewed.    ED Treatments / Results  Labs (all labs ordered are listed, but only abnormal results are displayed) Labs Reviewed - No data to display  EKG None  Radiology No results found.  Procedures Procedures (including critical care time)  Medications Ordered in ED Medications - No data to display   Initial Impression / Assessment and Plan / ED Course  I have reviewed the triage vital signs and the nursing notes.  Pertinent labs & imaging results that were available during my care of the patient were reviewed by me and considered in my medical decision making (see chart for details).    Patient in emergency department with fever, chills, left ear pain, cough, congestion.  Onset of symptoms yesterday.  Patient's left TM is erythematous and bulging, consistent with otitis media.  I will start her amoxicillin.  Also discussed taking decongestants, continue Tylenol Motrin, rest, oral fluid intake, follow-up with family doctor if not improving in 3 to 5 days.  Prior to discharge patient is tachycardic, however normal blood pressure, normal respiratory rate, normal oxygen saturation.  I suspect that she does have a fever which would explain her tachycardia.  I have given her Tylenol just prior to discharge.  Advised to increase oral fluids. Pt agreed.   Vitals:   05/30/17 2013 05/30/17 2103  BP: 127/80 120/77  Pulse: (!) 118 (!) 115  Resp: 19 14  Temp: 99.5 F (37.5 C)   TempSrc:  Oral   SpO2: 100% 98%  Weight: 70.3 kg (155 lb)   Height:  (1.6 m)      Final Clinical Impressions(s) / ED Diagnoses   Final diagnoses:  Non-recurrent acute suppurative otitis media of left ear without spontaneous rupture of tympanic membrane  Upper respiratory tract infection, unspecified type    ED Discharge Orders        Ordered    amoxicillin (AMOXIL) 500 MG capsule  3 times daily     05/30/17 2057    pseudoephedrine (SUDAFED) 30 MG tablet  Every 4 hours PRN     05/30/17 2057       Jaynie Crumble, PA-C 05/30/17 2134    Wynetta Fines, MD 05/30/17 2138

## 2017-09-24 ENCOUNTER — Encounter (HOSPITAL_COMMUNITY): Payer: Self-pay | Admitting: *Deleted

## 2017-09-24 ENCOUNTER — Other Ambulatory Visit: Payer: Self-pay

## 2017-09-24 ENCOUNTER — Inpatient Hospital Stay (HOSPITAL_COMMUNITY)
Admission: AD | Admit: 2017-09-24 | Discharge: 2017-09-24 | Disposition: A | Discharge status: Home / Self Care | Payer: Self-pay | Source: Ambulatory Visit | Attending: Obstetrics & Gynecology | Admitting: Obstetrics & Gynecology

## 2017-09-24 ENCOUNTER — Ambulatory Visit (INDEPENDENT_AMBULATORY_CARE_PROVIDER_SITE_OTHER): Payer: Self-pay | Admitting: Internal Medicine

## 2017-09-24 VITALS — BP 138/79 | HR 66 | Wt 166.0 lb

## 2017-09-24 DIAGNOSIS — N644 Mastodynia: Secondary | ICD-10-CM

## 2017-09-24 DIAGNOSIS — N912 Amenorrhea, unspecified: Secondary | ICD-10-CM | POA: Insufficient documentation

## 2017-09-24 DIAGNOSIS — N926 Irregular menstruation, unspecified: Secondary | ICD-10-CM

## 2017-09-24 DIAGNOSIS — Z3202 Encounter for pregnancy test, result negative: Secondary | ICD-10-CM

## 2017-09-24 NOTE — MAU Note (Signed)
nexplanon removed in July.  Been taking preg tests and they are all negative.  Had period 8/3, none since.  Breasts are tender for the past wk, seem swollen today.

## 2017-09-24 NOTE — Progress Notes (Signed)
   Subjective:    Nancy Henson - 22 y.o. female MRN [id number]  Date of birth: Dec 09, 1995  HPI  Nancy Henson is a 22 y.o. [phone] female here for missed menses and breast tenderness. Patient had Nexplanon removed in late July, a few days later had a menstrual cycle lasting for 7 days. LMP 08/11/17. She has not had any bleeding since. Reports she did not have periods while the Nexplanon was in place. She is sexually active, not using protection. Last sexually active 2-3 days ago. Has had a couple negative HPTs. Also endorsing breast tenderness bilaterally with nipple enlargement. She does not take any prescription medications or OTC medications or supplements.     OB History    Gravida  3   Para  3   Term  3   Preterm      AB      Living  3     SAB      TAB      Ectopic      Multiple  0   Live Births  3            -  reports that she has been smoking cigarettes. She has been smoking about 2.00 packs per day. She has never used smokeless tobacco. - Review of Systems: Per HPI. - Past Medical History: Patient Active Problem List   Diagnosis Date Noted  . Negative pregnancy test 05/20/2017  . 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 Cocaine abuse (HCC) 06/19/2016  . Migraine headache without aura 11/16/2014  . Nexplanon in place 11/16/2014   - Medications: reviewed and updated   Objective:   Physical Exam BP 138/79   Pulse 66   Wt 166 lb (75.3 kg)   LMP 08/11/2017   BMI 29.41 kg/m  Gen: NAD, alert, cooperative with exam, well-appearing Breast: Normal appearance of nipples and areola without erythema, enlargement, or drainage. Breast tissue is smooth without color changes and breasts appear symmetric. Breasts are TTP in the outer upper quadrants bilaterally. Dense breast tissue appreciated but no discrete masses present.      Assessment & Plan:   1. Missed menses Upreg negative. Discussed with  patient that body may be regulating menses after removal of hormonal contraception. Recommended repeating another HPT if has not had onset of period in one week, as Upreg can be false negative in very early pregnancy.  - Pregnancy, urine  2. Breast tenderness Breast exam is benign. Suspect related to hormonal fluctuations. If persists, would consider breast ultrasound. Discussed unlikely to be life-threatening given young age, symmetric involvement, and lack of palpable findings.   Return if symptoms worsen or fail to improve.  Marcy Sirenatherine Skarlett Sedlacek, D.O. OB Fellow  09/24/2017, 6:02 PM

## 2017-09-24 NOTE — MAU Provider Note (Signed)
  S:   22 y.o. [id number] @Unknown  by LMP presents to MAU for pregnancy confirmation.  She denies abdominal pain or vaginal bleeding today.  She is having bilateral swollen breasts. She would like someone to look at her breasts. She took a pregnancy test at home and it was negative.   O: BP 132/71 (BP Location: Right Arm)   Pulse 81   Temp 98.6 F (37 C) (Oral)   Resp 16   Wt 75.4 kg   LMP 08/11/2017   SpO2 100%   BMI 29.45 kg/m  Physical Examination: General appearance - alert, well appearing, and in no distress, oriented to person, place, and time and acyanotic, in no respiratory distress  No results found for this or any previous visit (from the past 48 hour(s)).  A: Missed menses  P: MSE done  D/C home Informed pt we do not do pregnancy verifications in [facility] & [facility] Return to MAU as needed for pregnancy related emergencies  Venia CarbonJennifer Heavan Francom, NP 5:27 PM

## 2017-09-24 NOTE — Progress Notes (Signed)
pregLMP 08/11/17. Tender breasts for a wk and nipples enlarged. Has had negative upts. Nexplanon removed in July. No birthcontrol currently

## 2017-09-24 NOTE — Patient Instructions (Signed)
Your urine pregnancy test was negative today. If you have not had your period within the next week, take another home pregnancy test. Your breast exam was negative today.

## 2017-09-25 LAB — PREGNANCY, URINE: Preg Test, Ur: NEGATIVE

## 2017-09-25 LAB — POCT PREGNANCY, URINE: Preg Test, Ur: NEGATIVE

## 2018-03-08 ENCOUNTER — Other Ambulatory Visit: Payer: Self-pay

## 2018-03-08 ENCOUNTER — Emergency Department (HOSPITAL_COMMUNITY)
Admission: EM | Admit: 2018-03-08 | Discharge: 2018-03-08 | Disposition: A | Discharge status: Home / Self Care | Payer: Self-pay | Attending: Emergency Medicine | Admitting: Emergency Medicine

## 2018-03-08 ENCOUNTER — Emergency Department (HOSPITAL_COMMUNITY): Payer: Self-pay

## 2018-03-08 ENCOUNTER — Encounter (HOSPITAL_COMMUNITY): Payer: Self-pay

## 2018-03-08 DIAGNOSIS — F419 Anxiety disorder, unspecified: Secondary | ICD-10-CM | POA: Insufficient documentation

## 2018-03-08 DIAGNOSIS — F1410 Cocaine abuse, uncomplicated: Secondary | ICD-10-CM | POA: Insufficient documentation

## 2018-03-08 DIAGNOSIS — R197 Diarrhea, unspecified: Secondary | ICD-10-CM | POA: Insufficient documentation

## 2018-03-08 DIAGNOSIS — R0789 Other chest pain: Secondary | ICD-10-CM | POA: Insufficient documentation

## 2018-03-08 DIAGNOSIS — F329 Major depressive disorder, single episode, unspecified: Secondary | ICD-10-CM | POA: Insufficient documentation

## 2018-03-08 DIAGNOSIS — F17210 Nicotine dependence, cigarettes, uncomplicated: Secondary | ICD-10-CM | POA: Insufficient documentation

## 2018-03-08 LAB — I-STAT BETA HCG BLOOD, ED (MC, WL, AP ONLY)

## 2018-03-08 LAB — COMPREHENSIVE METABOLIC PANEL
ALBUMIN: 3.9 g/dL (ref 3.5–5.0)
ALK PHOS: 75 U/L (ref 38–126)
ALT: 16 U/L (ref 0–44)
ANION GAP: 6 (ref 5–15)
AST: 17 U/L (ref 15–41)
BILIRUBIN TOTAL: 0.6 mg/dL (ref 0.3–1.2)
BUN: 8 mg/dL (ref 6–20)
CALCIUM: 9 mg/dL (ref 8.9–10.3)
CO2: 26 mmol/L (ref 22–32)
CREATININE: 0.57 mg/dL (ref 0.44–1.00)
Chloride: 107 mmol/L (ref 98–111)
GFR calc non Af Amer: >60 mL/min (ref >60)
GLUCOSE: 110 mg/dL — AB (ref 70–99)
Potassium: 3.7 mmol/L (ref 3.5–5.1)
SODIUM: 139 mmol/L (ref 135–145)
TOTAL PROTEIN: 7.2 g/dL (ref 6.5–8.1)

## 2018-03-08 LAB — CBC
HCT: 40.1 % (ref 36.0–46.0)
Hemoglobin: 13.1 g/dL (ref 12.0–15.0)
MCH: 31.6 pg (ref 26.0–34.0)
MCHC: 32.7 g/dL (ref 30.0–36.0)
MCV: 96.9 fL (ref 80.0–100.0)
PLATELETS: 254 10*3/uL (ref 150–400)
RBC: 4.14 MIL/uL (ref 3.87–5.11)
RDW: 12.3 % (ref 11.5–15.5)
WBC: 4.8 10*3/uL (ref 4.0–10.5)
nRBC: 0 % (ref 0.0–0.2)

## 2018-03-08 LAB — LIPASE, BLOOD: Lipase: 25 U/L (ref 11–51)

## 2018-03-08 MED ORDER — SODIUM CHLORIDE 0.9% FLUSH: 3.0000 mL | Freq: Once | INTRAVENOUS | Status: DC

## 2018-03-08 MED ORDER — NAPROXEN 500 MG PO TABS: 500.0000 mg | ORAL_TABLET | Freq: Two times a day (BID) | ORAL | 0 refills | Status: DC

## 2018-03-08 NOTE — ED Triage Notes (Signed)
Patient c/o right upper back pain that radiates into the back since this AM. Patient also c/o diarrhea x 2 days, but denies N/V.

## 2018-03-08 NOTE — Discharge Instructions (Signed)
You were evaluated today for chest pain and abdominal pain.  Your work-up was negative in department.  This is likely chest wall pain.  I prescribed you naproxen.  Please take as prescribed.  I would also suggest warm heating pad to the area.  If you develop sudden onset vomiting, worsening diarrhea or worsening abdominal pain, throwing up blood or blood in your stool please seek reevaluation.  Return to the ED for any worsening symptoms.

## 2018-03-08 NOTE — ED Provider Notes (Signed)
[facility]-EMERGENCY DEPT Provider Note   CSN: [id number] Arrival date & time: 03/08/18  0840    History   Chief Complaint Chief Complaint  Patient presents with  . Abdominal Pain    HPI Nancy Henson is a 23 y.o. female with past medical history significant for anxiety, chronic anemia presents for evaluation diarrhea and abdominal pain.  Patient states she has had generalized abdominal pain as well as diarrhea x2 days.  Two episodes of diarrhea with abdominal cramping. Rates her pain a 6/10.  Pain does not radiate.  No recent travel or antibiotics use. Patient states her symptoms are "okay" until she went to get out of the car this morning and turned to the right and she developed sudden onset right rib pain.  States her pain was initially a 10/10 however is now a 5/10.  Pain did not radiate. Right ribs are "tender to feel."  Denies fever, chills, chest pain, pleuritic chest pain, shortness of breath, cough, pelvic pain, vaginal discharge, constipation, rashes or lesions. Has not taken anything for her symptoms. No NSAID use, alcohol, tobacco use. Denies prior abdominal surgeries. Last PO intake at 0800.  History obtained from patient. No interpretor was used.   HPI  Past Medical History:  Diagnosis Date  . Anemia   . Anxiety   . Depression   . Headache   . No pertinent past medical history   . Pregnancy     Patient Active Problem List   Diagnosis Date Noted  . Negative pregnancy test 05/20/2017  . 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 Cocaine abuse (HCC) 06/19/2016  . Migraine headache without aura 11/16/2014  . Nexplanon in place 11/16/2014    Past Surgical History:  Procedure Laterality Date  . NO PAST SURGERIES       OB History    Gravida  3   Para  3   Term  3   Preterm      AB      Living  3     SAB      TAB      Ectopic      Multiple  0   Live Births  3            Home Medications    Prior to Admission medications   Medication Sig Start Date End Date Taking? Authorizing Provider  acetaminophen (TYLENOL) 500 MG tablet Take 1,000 mg by mouth every 6 (six) hours as needed for headache.   Yes [provider]  ibuprofen (ADVIL,MOTRIN) 200 MG tablet Take 400 mg by mouth every 6 (six) hours as needed for headache.   Yes [provider]  naproxen (NAPROSYN) 500 MG tablet Take 1 tablet (500 mg total) by mouth 2 (two) times daily. 03/08/18   Evonda Enge A, PA-C  sertraline (ZOLOFT) 50 MG tablet Take 1 tablet (50 mg total) by mouth daily. For depression Patient not taking: Reported on 04/27/2017 06/24/16   Sanjuana Kava, NP    Family History Family History  Problem Relation Age of Onset  . Alcohol abuse Neg Hx   . Arthritis Neg Hx   . Asthma Neg Hx   . Birth defects Neg Hx   . Cancer Neg Hx   . COPD Neg Hx   . Depression Neg Hx   . Diabetes Neg Hx   . Drug abuse Neg Hx   . Early death Neg Hx   . Hearing  loss Neg Hx   . Heart disease Neg Hx   . Hyperlipidemia Neg Hx   . Hypertension Neg Hx   . Kidney disease Neg Hx   . Learning disabilities Neg Hx   . Mental illness Neg Hx   . Mental retardation Neg Hx   . Miscarriages / Stillbirths Neg Hx   . Stroke Neg Hx   . Vision loss Neg Hx   . Varicose Veins Neg Hx     Social History Social History   Tobacco Use  . Smoking status: Current Every Day Smoker    Packs/day: 1.00    Types: Cigarettes  . Smokeless tobacco: Never Used  . Tobacco comment: refused  Substance Use Topics  . Alcohol use: Yes    Alcohol/week: 12.0 - 18.0 standard drinks    Types: 12 - 18 Cans of beer per week  . Drug use: Not Currently    Types: Cocaine, Marijuana    Comment: last used 6-8 months ago     Allergies   Patient has no known allergies.   Review of Systems Review of Systems  Constitutional: Negative.   HENT: Negative.   Eyes: Negative.   Respiratory: Negative.     Cardiovascular: Positive for chest pain. Negative for palpitations and leg swelling.  Gastrointestinal: Positive for abdominal pain, diarrhea and nausea. Negative for anal bleeding, blood in stool, constipation, rectal pain and vomiting.  Genitourinary: Negative.   Musculoskeletal: Negative.   Skin: Negative.   Neurological: Negative.   All other systems reviewed and are negative.    Physical Exam Updated Vital Signs BP 128/67   Pulse 61   Temp 98.5 F (36.9 C) (Oral)   Resp 15   Ht 5\' 4"  (1.626 m)   Wt 75.8 kg   LMP 03/08/2018   SpO2 100%   BMI 28.67 kg/m   Physical Exam Vitals signs and nursing note reviewed. Exam conducted with a chaperone present.  Constitutional:      General: She is not in acute distress.    Appearance: She is well-developed.  HENT:     Head: Atraumatic.     Mouth/Throat:     Mouth: Mucous membranes are moist.     Pharynx: Oropharynx is clear.  Eyes:     Pupils: Pupils are equal, round, and reactive to light.  Neck:     Musculoskeletal: Normal range of motion.  Cardiovascular:     Rate and Rhythm: Normal rate.     Pulses: Normal pulses.     Heart sounds: Normal heart sounds.  Pulmonary:     Effort: No respiratory distress.     Comments: Clear to ascultation bilaterally without rebound, wheeze or guarding. Able to speak in full sentences without difficulty. Chest:       Comments: tender to palpation to right lower ribs.  No crepitus, deformity, swelling, masses or lesions.  Abdominal:     General: There is no distension.     Comments: Soft, non tender without rebound or guarding. Normoactive bowel sounds.  Negative Murphy sign.  Negative CVA tenderness.  Musculoskeletal: Normal range of motion.  Skin:    General: Skin is warm and dry.  Neurological:     Mental Status: She is alert.    ED Treatments / Results  Labs (all labs ordered are listed, but only abnormal results are displayed) Labs Reviewed  COMPREHENSIVE METABOLIC PANEL -  Abnormal; Notable for the following components:      Result Value   Glucose, Bld 110 (*)  All other components within normal limits  LIPASE, BLOOD  CBC  URINALYSIS, ROUTINE W REFLEX MICROSCOPIC  I-STAT BETA HCG BLOOD, ED ([facility], [facility], [facility] ONLY)    EKG None  Radiology Dg Ribs Unilateral W/chest Right  Result Date: 03/08/2018 CLINICAL DATA:  Chest pain EXAM: RIGHT RIBS AND CHEST - 3+ VIEW COMPARISON:  Chest radiograph June 26, 2015 FINDINGS: Frontal chest as well as oblique and cone-down rib images obtained. Lungs are clear. Heart size and pulmonary vascularity are normal. No adenopathy. There is no appreciable rib fracture. No pneumothorax or pleural effusion. IMPRESSION: No rib fracture.  Lungs clear. Electronically Signed   By: Bretta Bang III M.D.   On: 03/08/2018 10:13    Procedures Procedures (including critical care time)  Medications Ordered in ED Medications  sodium chloride flush (NS) 0.9 % injection 3 mL (has no administration in time range)   Initial Impression / Assessment and Plan / ED Course  I have reviewed the triage vital signs and the nursing notes.  Pertinent labs & imaging results that were available during my care of the patient were reviewed by me and considered in my medical decision making (see chart for details).  23 year old female appears otherwise well presents for evaluation of abdominal pain and chest wall pain. Afebrile, Nonseptic, non-ill-appearing.  Generalized abdominal pain and diarrhea x2 days.  Able to tolerate p.o. intake without difficulty.  No known sick contacts.  Abdomen soft, nontender without rebound or guarding.  Right-sided chest wall pain/rib pain after she turned after getting out of her car.  Mild tenderness palpation to right lower ribs.  Patient does not want anything for pain at this time.  Will obtain labs, urine, chest x-ray and reevaluate.  0945: On reevaluation abdomen soft, nontender without rebound or guarding.  Negative  Murphy sign.  Patient has no complaints at this time.  Labs pending.  Will reevaluate.  1030: Reevaluation abdomen soft, nontender without rebound or guarding.  Patient states her pain is currently a 2/10.  CBC without leukocytosis, Bolick panel without electrolyte, renal liver abnormality, lipase 25, hCG negative, chest x-ray without evidence of infiltrates, no obvious rib fractures.  Likely with musculoskeletal pain to ribs.  Benign serial abdominal exam.  Discussed with patient possible gallbladder etiology and ultrasound.  Patient states her pain is more in her ribs and does not wish to proceed with an ultrasound at this time. Discussed risk vs benefit. Patient voiced understanding and declines further imaging at this time.  I have gone over patient's lab work answered any questions.  Low suspicion for ACS, PE, dissection causing patient's chest wall pain.  PERC negative, Wells criteria negative.   Patient is nontoxic, nonseptic appearing, in no apparent distress. Labs, imaging and vitals reviewed.  Patient does not meet the SIRS or Sepsis criteria.  On repeat exam patient does not have a surgical abdomin and there are no peritoneal signs.  No indication of appendicitis, bowel obstruction, bowel perforation, cholecystitis, diverticulitis, PID or ectopic pregnancy.  Patient discharged home with symptomatic treatment and given strict instructions for follow-up with their primary care physician.  I have also discussed reasons to return immediately to the ER.  Patient expresses understanding and agrees with plan.     Final Clinical Impressions(s) / ED Diagnoses   Final diagnoses:  Diarrhea, unspecified type  Chest wall pain    ED Discharge Orders         Ordered    naproxen (NAPROSYN) 500 MG tablet  2 times daily  03/08/18 1052           Rosmary Dionisio A, PA-C 03/08/18 1056    Azalia Bilisampos, Kevin, MD 03/08/18 1547

## 2018-03-24 ENCOUNTER — Other Ambulatory Visit: Payer: Self-pay

## 2018-03-24 ENCOUNTER — Emergency Department (HOSPITAL_COMMUNITY)
Admission: EM | Admit: 2018-03-24 | Discharge: 2018-03-24 | Disposition: A | Discharge status: Home / Self Care | Payer: Self-pay | Attending: Emergency Medicine | Admitting: Emergency Medicine

## 2018-03-24 ENCOUNTER — Encounter (HOSPITAL_COMMUNITY): Payer: Self-pay | Admitting: *Deleted

## 2018-03-24 DIAGNOSIS — R112 Nausea with vomiting, unspecified: Secondary | ICD-10-CM

## 2018-03-24 DIAGNOSIS — F17210 Nicotine dependence, cigarettes, uncomplicated: Secondary | ICD-10-CM | POA: Insufficient documentation

## 2018-03-24 DIAGNOSIS — A084 Viral intestinal infection, unspecified: Secondary | ICD-10-CM

## 2018-03-24 DIAGNOSIS — R197 Diarrhea, unspecified: Secondary | ICD-10-CM

## 2018-03-24 DIAGNOSIS — R1010 Upper abdominal pain, unspecified: Secondary | ICD-10-CM

## 2018-03-24 DIAGNOSIS — Z79899 Other long term (current) drug therapy: Secondary | ICD-10-CM | POA: Insufficient documentation

## 2018-03-24 LAB — URINALYSIS, ROUTINE W REFLEX MICROSCOPIC
Bilirubin Urine: NEGATIVE
GLUCOSE, UA: NEGATIVE mg/dL
HGB URINE DIPSTICK: NEGATIVE
Ketones, ur: NEGATIVE mg/dL
Leukocytes,Ua: NEGATIVE
Nitrite: NEGATIVE
Protein, ur: NEGATIVE mg/dL
SPECIFIC GRAVITY, URINE: 1.02 (ref 1.005–1.030)
pH: 5 (ref 5.0–8.0)

## 2018-03-24 LAB — CBC
HCT: 43.4 % (ref 36.0–46.0)
HEMOGLOBIN: 14.3 g/dL (ref 12.0–15.0)
MCH: 31.8 pg (ref 26.0–34.0)
MCHC: 32.9 g/dL (ref 30.0–36.0)
MCV: 96.7 fL (ref 80.0–100.0)
Platelets: 275 10*3/uL (ref 150–400)
RBC: 4.49 MIL/uL (ref 3.87–5.11)
RDW: 12.7 % (ref 11.5–15.5)
WBC: 9.9 10*3/uL (ref 4.0–10.5)
nRBC: 0 % (ref 0.0–0.2)

## 2018-03-24 LAB — COMPREHENSIVE METABOLIC PANEL
ALBUMIN: 4.2 g/dL (ref 3.5–5.0)
ALT: 24 U/L (ref 0–44)
AST: 18 U/L (ref 15–41)
Alkaline Phosphatase: 80 U/L (ref 38–126)
Anion gap: 8 (ref 5–15)
BUN: 13 mg/dL (ref 6–20)
CHLORIDE: 104 mmol/L (ref 98–111)
CO2: 25 mmol/L (ref 22–32)
Calcium: 8.8 mg/dL — ABNORMAL LOW (ref 8.9–10.3)
Creatinine, Ser: 0.54 mg/dL (ref 0.44–1.00)
GFR calc Af Amer: >60 mL/min (ref >60)
GFR calc non Af Amer: >60 mL/min (ref >60)
Glucose, Bld: 97 mg/dL (ref 70–99)
POTASSIUM: 4 mmol/L (ref 3.5–5.1)
Sodium: 137 mmol/L (ref 135–145)
Total Bilirubin: 0.6 mg/dL (ref 0.3–1.2)
Total Protein: 7.5 g/dL (ref 6.5–8.1)

## 2018-03-24 LAB — I-STAT BETA HCG BLOOD, ED (MC, WL, AP ONLY): I-stat hCG, quantitative: <5 m[IU]/mL (ref <5)

## 2018-03-24 LAB — LIPASE, BLOOD: Lipase: 26 U/L (ref 11–51)

## 2018-03-24 MED ORDER — SODIUM CHLORIDE 0.9% FLUSH
3.0000 mL | Freq: Once | INTRAVENOUS | Status: AC
  Administered 2018-03-24: 3 mL via INTRAVENOUS

## 2018-03-24 MED ORDER — ONDANSETRON HCL 4 MG/2ML IJ SOLN
4.0000 mg | Freq: Once | INTRAMUSCULAR | Status: AC
  Administered 2018-03-24: 4 mg via INTRAVENOUS
  Filled 2018-03-24: qty 2

## 2018-03-24 MED ORDER — KETOROLAC TROMETHAMINE 30 MG/ML IJ SOLN
15.0000 mg | Freq: Once | INTRAMUSCULAR | Status: AC
  Administered 2018-03-24: 15 mg via INTRAVENOUS
  Filled 2018-03-24: qty 1

## 2018-03-24 MED ORDER — DICYCLOMINE HCL 20 MG PO TABS: 20.0000 mg | ORAL_TABLET | Freq: Three times a day (TID) | ORAL | 0 refills | Status: DC | PRN

## 2018-03-24 MED ORDER — ONDANSETRON 4 MG PO TBDP: 4.0000 mg | ORAL_TABLET | Freq: Three times a day (TID) | ORAL | 0 refills | Status: DC | PRN

## 2018-03-24 MED ORDER — SODIUM CHLORIDE 0.9 % IV BOLUS
1000.0000 mL | Freq: Once | INTRAVENOUS | Status: AC
  Administered 2018-03-24: 1000 mL via INTRAVENOUS

## 2018-03-24 MED ORDER — DICYCLOMINE HCL 10 MG PO CAPS
20.0000 mg | ORAL_CAPSULE | Freq: Once | ORAL | Status: AC
  Administered 2018-03-24: 20 mg via ORAL
  Filled 2018-03-24: qty 2

## 2018-03-24 NOTE — ED Triage Notes (Signed)
Pt complains of abdominal cramping, emesis, diarrhea x 3 days.

## 2018-03-24 NOTE — Discharge Instructions (Signed)
Use zofran as prescribed, as needed for nausea. Take bentyl as directed as needed for abdominal pain and diarrhea. Alternate between tylenol and motrin as needed for pain. May consider using over the counter tums, maalox, pepto bismol, or other over the counter remedies to help with symptoms. Stay well hydrated with small sips of fluids throughout the day. Follow a BRAT (banana-rice-applesauce-toast) diet as described below for the next 24-48 hours. The 'BRAT' diet is suggested, then progress to diet as tolerated as symptoms abate. Call your regular doctor if bloody stools, persistent diarrhea, vomiting, fever or abdominal pain. Follow up with your regular doctor in 1 week for recheck of symptoms. Return to ER for changing or worsening of symptoms.

## 2018-03-24 NOTE — ED Provider Notes (Signed)
[facility]-EMERGENCY DEPT Provider Note   CSN: [id number] Arrival date & time: 03/24/18  1238    History   Chief Complaint Chief Complaint  Patient presents with  . Abdominal Pain    HPI    Nancy Henson is a 23 y.o. female with a PMHx of anxiety, depression, headaches, anemia, and other conditions, who presents to the ED with complaints of abdominal pain that began initially about a week ago but got better until about 3 days ago when it got worse.  She describes her pain as 8/10 intermittent generalized cramping, nonradiating, with no known aggravating factors, and unrelieved with Tylenol and ibuprofen.  She reports associated nausea, 2 episodes yesterday of nonbloody nonbilious emesis but none today, and 2-3 episodes daily of nonbloody diarrhea.  She admits to drinking 1 beer last night but otherwise denies any frequent alcohol use.  She denies any recent travel, sick contacts, suspicious food intake, NSAID use, or prior abdominal surgeries.  She also denies any fevers, chills, chest pain, shortness of breath, hematemesis, melena, hematochezia, constipation, obstipation, dysuria, hematuria, vaginal bleeding or discharge, numbness, tingling, focal weakness, or any other complaints at this time.  The history is provided by the patient and medical records. No language interpreter was used.  Abdominal Pain  Associated symptoms: diarrhea, nausea and vomiting   Associated symptoms: no chest pain, no chills, no constipation, no dysuria, no fever, no hematuria, no shortness of breath, no vaginal bleeding and no vaginal discharge     Past Medical History:  Diagnosis Date  . Anemia   . Anxiety   . Depression   . Headache   . No pertinent past medical history   . Pregnancy     Patient Active Problem List   Diagnosis Date Noted  . Negative pregnancy test 05/20/2017  . 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  Cocaine abuse (HCC) 06/19/2016  . Migraine headache without aura 11/16/2014  . Nexplanon in place 11/16/2014    Past Surgical History:  Procedure Laterality Date  . NO PAST SURGERIES       OB History    Gravida  3   Para  3   Term  3   Preterm      AB      Living  3     SAB      TAB      Ectopic      Multiple  0   Live Births  3            Home Medications    Prior to Admission medications   Medication Sig Start Date End Date Taking? Authorizing Provider  acetaminophen (TYLENOL) 500 MG tablet Take 1,000 mg by mouth every 6 (six) hours as needed for headache.    [provider]  ibuprofen (ADVIL,MOTRIN) 200 MG tablet Take 400 mg by mouth every 6 (six) hours as needed for headache.    [provider]  naproxen (NAPROSYN) 500 MG tablet Take 1 tablet (500 mg total) by mouth 2 (two) times daily. 03/08/18   Henderly, Britni A, PA-C  sertraline (ZOLOFT) 50 MG tablet Take 1 tablet (50 mg total) by mouth daily. For depression Patient not taking: Reported on 04/27/2017 06/24/16   Sanjuana Kava, NP    Family History Family History  Problem Relation Age of Onset  . Alcohol abuse Neg Hx   . Arthritis Neg Hx   . Asthma Neg Hx   .  Birth defects Neg Hx   . Cancer Neg Hx   . COPD Neg Hx   . Depression Neg Hx   . Diabetes Neg Hx   . Drug abuse Neg Hx   . Early death Neg Hx   . Hearing loss Neg Hx   . Heart disease Neg Hx   . Hyperlipidemia Neg Hx   . Hypertension Neg Hx   . Kidney disease Neg Hx   . Learning disabilities Neg Hx   . Mental illness Neg Hx   . Mental retardation Neg Hx   . Miscarriages / Stillbirths Neg Hx   . Stroke Neg Hx   . Vision loss Neg Hx   . Varicose Veins Neg Hx     Social History Social History   Tobacco Use  . Smoking status: Current Every Day Smoker    Packs/day: 1.00    Types: Cigarettes  . Smokeless tobacco: Never Used  . Tobacco comment: refused  Substance Use Topics  . Alcohol use: Yes     Alcohol/week: 12.0 - 18.0 standard drinks    Types: 12 - 18 Cans of beer per week  . Drug use: Not Currently    Types: Cocaine, Marijuana    Comment: last used 6-8 months ago     Allergies   Patient has no known allergies.   Review of Systems Review of Systems  Constitutional: Negative for chills and fever.  Respiratory: Negative for shortness of breath.   Cardiovascular: Negative for chest pain.  Gastrointestinal: Positive for abdominal pain, diarrhea, nausea and vomiting. Negative for blood in stool and constipation.  Genitourinary: Negative for dysuria, hematuria, vaginal bleeding and vaginal discharge.  Musculoskeletal: Negative for arthralgias and myalgias.  Skin: Negative for color change.  Allergic/Immunologic: Negative for immunocompromised state.  Neurological: Negative for weakness and numbness.  Psychiatric/Behavioral: Negative for confusion.   All other systems reviewed and are negative for acute change except as noted in the HPI.    Physical Exam Updated Vital Signs BP 124/76 (BP Location: Left Arm)   Pulse 88   Temp 98.7 F (37.1 C) (Oral)   Resp 18   LMP 03/08/2018   SpO2 99%   Physical Exam Vitals signs and nursing note reviewed.  Constitutional:      General: She is not in acute distress.    Appearance: Normal appearance. She is well-developed. She is not toxic-appearing.     Comments: Afebrile, nontoxic, NAD  HENT:     Head: Normocephalic and atraumatic.  Eyes:     General:        Right eye: No discharge.        Left eye: No discharge.     Conjunctiva/sclera: Conjunctivae normal.  Neck:     Musculoskeletal: Normal range of motion and neck supple.  Cardiovascular:     Rate and Rhythm: Normal rate and regular rhythm.     Pulses: Normal pulses.     Heart sounds: Normal heart sounds, S1 normal and S2 normal. No murmur. No friction rub. No gallop.   Pulmonary:     Effort: Pulmonary effort is normal. No respiratory distress.     Breath sounds:  Normal breath sounds. No decreased breath sounds, wheezing, rhonchi or rales.  Abdominal:     General: Bowel sounds are normal. There is no distension.     Palpations: Abdomen is soft. Abdomen is not rigid.     Tenderness: There is abdominal tenderness in the right upper quadrant, epigastric area and left upper quadrant.  There is no right CVA tenderness, left CVA tenderness, guarding or rebound. Negative signs include Murphy's sign and McBurney's sign.     Comments: Soft, nondistended, +BS throughout, with mild diffuse upper abd TTP, no lower abd TTP, no r/g/r, neg murphy's, neg mcburney's, no CVA TTP   Musculoskeletal: Normal range of motion.  Skin:    General: Skin is warm and dry.     Findings: No rash.  Neurological:     Mental Status: She is alert and oriented to person, place, and time.     Sensory: Sensation is intact. No sensory deficit.     Motor: Motor function is intact.  Psychiatric:        Mood and Affect: Mood and affect normal.        Behavior: Behavior normal.      ED Treatments / Results  Labs (all labs ordered are listed, but only abnormal results are displayed) Labs Reviewed  COMPREHENSIVE METABOLIC PANEL - Abnormal; Notable for the following components:      Result Value   Calcium 8.8 (*)    All other components within normal limits  URINALYSIS, ROUTINE W REFLEX MICROSCOPIC - Abnormal; Notable for the following components:   APPearance HAZY (*)    All other components within normal limits  LIPASE, BLOOD  CBC  I-STAT BETA HCG BLOOD, ED ([facility], [facility], [facility] ONLY)    EKG None  Radiology No results found.  Procedures Procedures (including critical care time)  Medications Ordered in ED Medications  sodium chloride flush (NS) 0.9 % injection 3 mL (3 mLs Intravenous Given 03/24/18 1353)  ketorolac (TORADOL) 30 MG/ML injection 15 mg (15 mg Intravenous Given 03/24/18 1426)  ondansetron (ZOFRAN) injection 4 mg (4 mg Intravenous Given 03/24/18 1426)  dicyclomine  (BENTYL) capsule 20 mg (20 mg Oral Given 03/24/18 1426)  sodium chloride 0.9 % bolus 1,000 mL (1,000 mLs Intravenous New Bag/Given 03/24/18 1427)     Initial Impression / Assessment and Plan / ED Course  I have reviewed the triage vital signs and the nursing notes.  Pertinent labs & imaging results that were available during my care of the patient were reviewed by me and considered in my medical decision making (see chart for details).        23 y.o. female here with abdominal pain, nausea, vomiting, and diarrhea for the last 3 days.  States that she felt ill about a week ago with similar symptoms but got better until about 3 days ago.  On exam, mild upper abdominal tenderness diffusely, no lower abdominal tenderness, non-peritoneal, negative Murphy's.  Overall well-appearing.  Will get labs but hold off on any imaging, suspect likely viral gastroenteritis.  Will give pain medicine, nausea medicine, bentyl, fluids, and reassess shortly.  3:52 PM CBC WNL. CMP unremarkable. Lipase WNL. U/A unremarkable. BetaHCG neg. Suspect viral gastroenteritis. Pt feeling better and tolerating PO well. Discussed OTC remedies for symptomatic relief, BRAT diet, adequate hydration, and will rx zofran and bentyl. Advised tylenol/motrin for pain. F/up with PCP in 1wk for recheck of symptoms. I explained the diagnosis and have given explicit precautions to return to the ER including for any other new or worsening symptoms. The patient understands and accepts the medical plan as it's been dictated and I have answered their questions. Discharge instructions concerning home care and prescriptions have been given. The patient is STABLE and is discharged to home in good condition.     Final Clinical Impressions(s) / ED Diagnoses   Final diagnoses:  Nausea vomiting and diarrhea  Upper abdominal pain  Viral gastroenteritis    ED Discharge Orders         Ordered    ondansetron (ZOFRAN ODT) 4 MG disintegrating tablet   Every 8 hours PRN     03/24/18 1551    dicyclomine (BENTYL) 20 MG tablet  3 times daily with meals PRN     03/24/18 [address] [place], [place] 03/24/18 1553    Terrilee Files, MD 03/25/18 [phone]

## 2018-05-10 ENCOUNTER — Encounter: Payer: Self-pay | Admitting: *Deleted

## 2018-05-16 ENCOUNTER — Telehealth: Payer: Self-pay | Admitting: Obstetrics and Gynecology

## 2018-05-16 ENCOUNTER — Emergency Department (HOSPITAL_COMMUNITY)
Admission: EM | Admit: 2018-05-16 | Discharge: 2018-05-16 | Disposition: A | Discharge status: Home / Self Care | Payer: Medicaid Other | Attending: Emergency Medicine | Admitting: Emergency Medicine

## 2018-05-16 ENCOUNTER — Other Ambulatory Visit: Payer: Self-pay

## 2018-05-16 DIAGNOSIS — Z3A01 Less than 8 weeks gestation of pregnancy: Secondary | ICD-10-CM

## 2018-05-16 DIAGNOSIS — Z3201 Encounter for pregnancy test, result positive: Secondary | ICD-10-CM | POA: Diagnosis not present

## 2018-05-16 DIAGNOSIS — F17210 Nicotine dependence, cigarettes, uncomplicated: Secondary | ICD-10-CM | POA: Diagnosis not present

## 2018-05-16 DIAGNOSIS — Z79899 Other long term (current) drug therapy: Secondary | ICD-10-CM | POA: Insufficient documentation

## 2018-05-16 LAB — URINALYSIS, ROUTINE W REFLEX MICROSCOPIC
Bilirubin Urine: NEGATIVE
Glucose, UA: NEGATIVE mg/dL
Hgb urine dipstick: NEGATIVE
Ketones, ur: NEGATIVE mg/dL
Leukocytes,Ua: NEGATIVE
Nitrite: NEGATIVE
Protein, ur: NEGATIVE mg/dL
Specific Gravity, Urine: 1.017 (ref 1.005–1.030)
pH: 7 (ref 5.0–8.0)

## 2018-05-16 LAB — PREGNANCY, URINE: Preg Test, Ur: POSITIVE — AB

## 2018-05-16 MED ORDER — PREPLUS 27-1 MG PO TABS: 1.0000 | ORAL_TABLET | Freq: Every day | ORAL | 6 refills | Status: DC

## 2018-05-16 NOTE — Telephone Encounter (Signed)
The patient called in to schedule appointment for the start of her prenatal care. Informed of the virtual appointment and ensured she downloaded the app.

## 2018-05-16 NOTE — Discharge Instructions (Signed)
Please return for any problem. Follow up with your regular care provider as instructed.   Your pregnancy test today is positive. Given the reported dates of your last period, you are very early in a new pregnancy. Close follow up with your OB is important for both your health and the health of your unborn child.

## 2018-05-16 NOTE — ED Provider Notes (Signed)
[facility]-EMERGENCY DEPT Provider Note   CSN: [id number] Arrival date & time: 05/16/18  1041    History   Chief Complaint No chief complaint on file.   HPI Nancy Henson is a 23 y.o. female.     23 year old female with prior medical history as detailed below presents for evaluation of possible pregnancy.  Patient reports that her last regular period was at the beginning of April.  She reports that she missed her period at the beginning of this month.  She reports a positive pregnancy test at home 2 days ago.  She presents today requesting a repeat pregnancy test to confirm pregnancy.  She denies current abdominal pain.  She denies associated nausea, vomiting, fever, vaginal discharge or bleeding, or other complaint.  The history is provided by the patient and medical records.  Illness  Location:  Requests pregnancy test  Severity:  Mild Onset quality:  Gradual Duration:  2 days Timing:  Constant Progression:  Unchanged Chronicity:  New Associated symptoms: no abdominal pain     Past Medical History:  Diagnosis Date  . Anemia   . Anxiety   . Depression   . Headache   . No pertinent past medical history   . Pregnancy     Patient Active Problem List   Diagnosis Date Noted  . Negative pregnancy test 05/20/2017  . 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 Cocaine abuse (HCC) 06/19/2016  . Migraine headache without aura 11/16/2014  . Nexplanon in place 11/16/2014    Past Surgical History:  Procedure Laterality Date  . NO PAST SURGERIES       OB History    Gravida  3   Para  3   Term  3   Preterm      AB      Living  3     SAB      TAB      Ectopic      Multiple  0   Live Births  3            Home Medications    Prior to Admission medications   Medication Sig Start Date End Date Taking? Authorizing Provider  acetaminophen (TYLENOL) 500 MG tablet Take 1,000 mg  by mouth every 6 (six) hours as needed for headache.    [provider]  dicyclomine (BENTYL) 20 MG tablet Take 1 tablet (20 mg total) by mouth 3 (three) times daily with meals as needed for spasms (abdominal cramping and diarrhea). 03/24/18   Street, [place]cedes, PA-C  ibuprofen (ADVIL,MOTRIN) 200 MG tablet Take 400 mg by mouth every 6 (six) hours as needed for headache.    [provider]  naproxen (NAPROSYN) 500 MG tablet Take 1 tablet (500 mg total) by mouth 2 (two) times daily. 03/08/18   Henderly, Britni A, PA-C  ondansetron (ZOFRAN ODT) 4 MG disintegrating tablet Take 1 tablet (4 mg total) by mouth every 8 (eight) hours as needed for nausea or vomiting. 03/24/18   Street, [place]cedes, PA-C  sertraline (ZOLOFT) 50 MG tablet Take 1 tablet (50 mg total) by mouth daily. For depression Patient not taking: Reported on 04/27/2017 06/24/16   Sanjuana KavaNwoko, Agnes I, NP    Family History Family History  Problem Relation Age of Onset  . Alcohol abuse Neg Hx   . Arthritis Neg Hx   . Asthma Neg Hx   . Birth defects Neg Hx   . Cancer Neg Hx   .  COPD Neg Hx   . Depression Neg Hx   . Diabetes Neg Hx   . Drug abuse Neg Hx   . Early death Neg Hx   . Hearing loss Neg Hx   . Heart disease Neg Hx   . Hyperlipidemia Neg Hx   . Hypertension Neg Hx   . Kidney disease Neg Hx   . Learning disabilities Neg Hx   . Mental illness Neg Hx   . Mental retardation Neg Hx   . Miscarriages / Stillbirths Neg Hx   . Stroke Neg Hx   . Vision loss Neg Hx   . Varicose Veins Neg Hx     Social History Social History   Tobacco Use  . Smoking status: Current Every Day Smoker    Packs/day: 1.00    Types: Cigarettes  . Smokeless tobacco: Never Used  . Tobacco comment: refused  Substance Use Topics  . Alcohol use: Yes    Alcohol/week: 12.0 - 18.0 standard drinks    Types: 12 - 18 Cans of beer per week  . Drug use: Not Currently    Types: Cocaine, Marijuana    Comment: last used 6-8 months ago      Allergies   Patient has no known allergies.   Review of Systems Review of Systems  Gastrointestinal: Negative for abdominal pain.  All other systems reviewed and are negative.    Physical Exam Updated Vital Signs BP 118/60 (BP Location: Left Arm)   Pulse 81   Temp 98.2 F (36.8 C) (Oral)   Resp 18   Ht 5\' 4"  (1.626 m)   Wt 72.6 kg   LMP 04/10/2018 (Exact Date)   SpO2 99%   BMI 27.46 kg/m   Physical Exam Vitals signs and nursing note reviewed.  Constitutional:      General: She is not in acute distress.    Appearance: Normal appearance. She is well-developed.  HENT:     Head: Normocephalic and atraumatic.  Eyes:     Conjunctiva/sclera: Conjunctivae normal.     Pupils: Pupils are equal, round, and reactive to light.  Neck:     Musculoskeletal: Normal range of motion and neck supple.  Cardiovascular:     Rate and Rhythm: Normal rate and regular rhythm.     Heart sounds: Normal heart sounds.  Pulmonary:     Effort: Pulmonary effort is normal. No respiratory distress.     Breath sounds: Normal breath sounds.  Abdominal:     General: Abdomen is flat. Bowel sounds are normal. There is no distension.     Palpations: Abdomen is soft. There is no mass.     Tenderness: There is no abdominal tenderness. There is no right CVA tenderness, left CVA tenderness, guarding or rebound.     Hernia: No hernia is present.  Musculoskeletal: Normal range of motion.        General: No deformity.  Skin:    General: Skin is warm and dry.  Neurological:     Mental Status: She is alert and oriented to person, place, and time.      ED Treatments / Results  Labs (all labs ordered are listed, but only abnormal results are displayed) Labs Reviewed  PREGNANCY, URINE - Abnormal; Notable for the following components:      Result Value   Preg Test, Ur POSITIVE (*)    All other components within normal limits  URINALYSIS, ROUTINE W REFLEX MICROSCOPIC - Abnormal; Notable for the following  components:   APPearance  HAZY (*)    All other components within normal limits    EKG None  Radiology No results found.  Procedures Procedures (including critical care time)  Medications Ordered in ED Medications - No data to display   Initial Impression / Assessment and Plan / ED Course  I have reviewed the triage vital signs and the nursing notes.  Pertinent labs & imaging results that were available during my care of the patient were reviewed by me and considered in my medical decision making (see chart for details).        MDM  Screen complete  Nancy Henson was evaluated in Emergency Department on 05/16/2018 for the symptoms described in the history of present illness. She was evaluated in the context of the global COVID-19 pandemic, which necessitated consideration that the patient might be at risk for infection with the SARS-CoV-2 virus that causes COVID-19. Institutional protocols and algorithms that pertain to the evaluation of patients at risk for COVID-19 are in a state of rapid change based on information released by regulatory bodies including the CDC and federal and state organizations. These policies and algorithms were followed during the patient's care in the ED.  She is here for confirmation of early pregnancy.  Her pregnancy test today is positive.  She is without other complaint.  Abdominal exam is benign.  She has no reported vaginal bleeding or discharge.  Patient does understand need for close follow-up.  She has a plan to follow-up with her OB from her prior pregnancies.  Importance of close follow-up is repeatedly stressed.  Strict return precautions given and understood.  Final Clinical Impressions(s) / ED Diagnoses   Final diagnoses:  Less than [redacted] weeks gestation of pregnancy    ED Discharge Orders    None       Wynetta Fines, MD 05/16/18 1219

## 2018-05-16 NOTE — Telephone Encounter (Signed)
Pt requested an Rx for PNV.  Notified pt that her PNV have been prescribed and sent to her [facility] pharmacy on [address].  Pt stated thank you with no further questions.

## 2018-05-16 NOTE — ED Triage Notes (Signed)
Pt c/o of sharp pains/cramps in her sides when she picked up her baby.  Pt also states she missed her period, it was supposed to start May 1.  Pt c/o of some nausea, vomiting, and flatulence.

## 2018-05-31 ENCOUNTER — Emergency Department (HOSPITAL_COMMUNITY): Payer: Medicaid Other

## 2018-05-31 ENCOUNTER — Encounter (HOSPITAL_COMMUNITY): Payer: Self-pay | Admitting: Emergency Medicine

## 2018-05-31 ENCOUNTER — Other Ambulatory Visit: Payer: Self-pay

## 2018-05-31 ENCOUNTER — Emergency Department (HOSPITAL_COMMUNITY)
Admission: EM | Admit: 2018-05-31 | Discharge: 2018-05-31 | Disposition: A | Discharge status: Home / Self Care | Payer: Medicaid Other | Attending: Emergency Medicine | Admitting: Emergency Medicine

## 2018-05-31 DIAGNOSIS — Z79899 Other long term (current) drug therapy: Secondary | ICD-10-CM | POA: Diagnosis not present

## 2018-05-31 DIAGNOSIS — O418X10 Other specified disorders of amniotic fluid and membranes, first trimester, not applicable or unspecified: Secondary | ICD-10-CM

## 2018-05-31 DIAGNOSIS — R109 Unspecified abdominal pain: Secondary | ICD-10-CM

## 2018-05-31 DIAGNOSIS — Z3A01 Less than 8 weeks gestation of pregnancy: Secondary | ICD-10-CM | POA: Diagnosis not present

## 2018-05-31 DIAGNOSIS — O418X11 Other specified disorders of amniotic fluid and membranes, first trimester, fetus 1: Secondary | ICD-10-CM | POA: Diagnosis not present

## 2018-05-31 DIAGNOSIS — R102 Pelvic and perineal pain: Secondary | ICD-10-CM | POA: Diagnosis not present

## 2018-05-31 DIAGNOSIS — O468X1 Other antepartum hemorrhage, first trimester: Secondary | ICD-10-CM | POA: Diagnosis not present

## 2018-05-31 DIAGNOSIS — O9989 Other specified diseases and conditions complicating pregnancy, childbirth and the puerperium: Secondary | ICD-10-CM | POA: Diagnosis not present

## 2018-05-31 DIAGNOSIS — O26891 Other specified pregnancy related conditions, first trimester: Secondary | ICD-10-CM

## 2018-05-31 LAB — WET PREP, GENITAL
Sperm: NONE SEEN
Trich, Wet Prep: NONE SEEN
Yeast Wet Prep HPF POC: NONE SEEN

## 2018-05-31 LAB — URINALYSIS, ROUTINE W REFLEX MICROSCOPIC
Bilirubin Urine: NEGATIVE
Glucose, UA: NEGATIVE mg/dL
Hgb urine dipstick: NEGATIVE
Ketones, ur: NEGATIVE mg/dL
Leukocytes,Ua: NEGATIVE
Nitrite: NEGATIVE
Protein, ur: NEGATIVE mg/dL
Specific Gravity, Urine: 1.017 (ref 1.005–1.030)
pH: 8 (ref 5.0–8.0)

## 2018-05-31 LAB — HCG, QUANTITATIVE, PREGNANCY: hCG, Beta Chain, Quant, S: 36665 m[IU]/mL — ABNORMAL HIGH (ref <5)

## 2018-05-31 MED ORDER — ACETAMINOPHEN 325 MG PO TABS
650.0000 mg | ORAL_TABLET | Freq: Once | ORAL | Status: AC
  Administered 2018-05-31: 650 mg via ORAL
  Filled 2018-05-31: qty 2

## 2018-05-31 NOTE — ED Provider Notes (Signed)
[facility]-EMERGENCY DEPT Provider Note   CSN: [id number] Arrival date & time: 05/31/18  1434    History   Chief Complaint Chief Complaint  Patient presents with  . Abdominal Pain  . Pelvic Pain    HPI Nancy Henson is a 23 y.o. female.     Patient is a 23 year old G4 P3-0-0-3 who is presenting today with left-sided pelvic pain.  Patient states that last menstrual period was on April 1 and she has not had a menses since.  Today she woke up and she had a sharp pain in her left mid abdomen that was worse with movement.  She states initially got a little bit better and then came back and has been persistent all day.  However now it has moved down more to her mid and left pelvis.  Any type of movement makes it worse and it is better if she is sitting still.  She has had no dysuria, frequency or urgency.  Her discharge has become little thicker but she denies any itching or burning.  She is sexually active with only her husband for the last 2 years.  She has no nausea vomiting or diarrhea.     Past Medical History:  Diagnosis Date  . Anemia   . Anxiety   . Depression   . Headache   . No pertinent past medical history   . Pregnancy     Patient Active Problem List   Diagnosis Date Noted  . Negative pregnancy test 05/20/2017  . 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 Cocaine abuse (HCC) 06/19/2016  . Migraine headache without aura 11/16/2014  . Nexplanon in place 11/16/2014    Past Surgical History:  Procedure Laterality Date  . NO PAST SURGERIES       OB History    Gravida  4   Para  3   Term  3   Preterm      AB      Living  3     SAB      TAB      Ectopic      Multiple  0   Live Births  3            Home Medications    Prior to Admission medications   Medication Sig Start Date End Date Taking? Authorizing Provider  dicyclomine (BENTYL) 20 MG tablet Take 1 tablet  (20 mg total) by mouth 3 (three) times daily with meals as needed for spasms (abdominal cramping and diarrhea). Patient not taking: Reported on 05/16/2018 03/24/18   Street, [place], PA-C  naproxen (NAPROSYN) 500 MG tablet Take 1 tablet (500 mg total) by mouth 2 (two) times daily. Patient not taking: Reported on 05/16/2018 03/08/18   Henderly, Britni A, PA-C  ondansetron (ZOFRAN ODT) 4 MG disintegrating tablet Take 1 tablet (4 mg total) by mouth every 8 (eight) hours as needed for nausea or vomiting. Patient not taking: Reported on 05/16/2018 03/24/18   Street, [place], PA-C  Prenatal Vit-Fe Fumarate-FA (PREPLUS) 27-1 MG TABS Take 1 tablet by mouth daily. 05/16/18   Constant, Peggy, MD  sertraline (ZOLOFT) 50 MG tablet Take 1 tablet (50 mg total) by mouth daily. For depression Patient not taking: Reported on 04/27/2017 06/24/16   Sanjuana Kava, NP    Family History Family History  Problem Relation Age of Onset  . Alcohol abuse Neg Hx   . Arthritis Neg Hx   . Asthma Neg  Hx   . Birth defects Neg Hx   . Cancer Neg Hx   . COPD Neg Hx   . Depression Neg Hx   . Diabetes Neg Hx   . Drug abuse Neg Hx   . Early death Neg Hx   . Hearing loss Neg Hx   . Heart disease Neg Hx   . Hyperlipidemia Neg Hx   . Hypertension Neg Hx   . Kidney disease Neg Hx   . Learning disabilities Neg Hx   . Mental illness Neg Hx   . Mental retardation Neg Hx   . Miscarriages / Stillbirths Neg Hx   . Stroke Neg Hx   . Vision loss Neg Hx   . Varicose Veins Neg Hx     Social History Social History   Tobacco Use  . Smoking status: Current Every Day Smoker    Packs/day: 1.00    Types: Cigarettes  . Smokeless tobacco: Never Used  . Tobacco comment: refused  Substance Use Topics  . Alcohol use: Yes    Alcohol/week: 12.0 - 18.0 standard drinks    Types: 12 - 18 Cans of beer per week  . Drug use: Not Currently    Types: Cocaine, Marijuana    Comment: last used 6-8 months ago     Allergies   Patient has no known  allergies.   Review of Systems Review of Systems  All other systems reviewed and are negative.    Physical Exam Updated Vital Signs BP 133/72 (BP Location: Right Arm)   Pulse 90   Temp 98.3 F (36.8 C) (Oral)   Resp 18   LMP 05/10/2018   SpO2 99%   Physical Exam Vitals signs and nursing note reviewed.  Constitutional:      General: She is in acute distress.     Appearance: She is well-developed.  HENT:     Head: Normocephalic and atraumatic.  Eyes:     Pupils: Pupils are equal, round, and reactive to light.  Cardiovascular:     Rate and Rhythm: Normal rate and regular rhythm.     Heart sounds: Normal heart sounds. No murmur. No friction rub.  Pulmonary:     Effort: Pulmonary effort is normal.     Breath sounds: Normal breath sounds. No wheezing or rales.  Abdominal:     General: Bowel sounds are normal. There is no distension.     Palpations: Abdomen is soft.     Tenderness: There is abdominal tenderness in the suprapubic area and left lower quadrant. There is no right CVA tenderness, left CVA tenderness, guarding or rebound.     Comments: Left pelvic tenderness  Genitourinary:    Vagina: No vaginal discharge or bleeding.     Cervix: No cervical motion tenderness, discharge or friability.     Uterus: Enlarged. Not tender.      Adnexa:        Right: No mass, tenderness or fullness.         Left: Tenderness present. No mass or fullness.    Musculoskeletal: Normal range of motion.        General: No tenderness.     Comments: No edema  Skin:    General: Skin is warm and dry.     Findings: No rash.  Neurological:     Mental Status: She is alert and oriented to person, place, and time.     Cranial Nerves: No cranial nerve deficit.  Psychiatric:  Behavior: Behavior normal.      ED Treatments / Results  Labs (all labs ordered are listed, but only abnormal results are displayed) Labs Reviewed  WET PREP, GENITAL  HCG, QUANTITATIVE, PREGNANCY  URINALYSIS,  ROUTINE W REFLEX MICROSCOPIC  GC/CHLAMYDIA PROBE AMP () [facility]    EKG None  Radiology No results found.  Procedures Procedures (including critical care time)  Medications Ordered in ED Medications  acetaminophen (TYLENOL) tablet 650 mg (has no administration in time range)     Initial Impression / Assessment and Plan / ED Course  I have reviewed the triage vital signs and the nursing notes.  Pertinent labs & imaging results that were available during my care of the patient were reviewed by me and considered in my medical decision making (see chart for details).       Patient presenting today with abrupt onset of left pelvic pain that has been persistent all day long.  It is worse with movement and better with sitting still.  She denies any urinary or vaginal complaints such as discharge or bleeding.  She has had 3 pregnancies prior to this that went full-term without significant complication.  She denies any infectious symptoms and low suspicion for UTI or STI at this time.  Patient has not had an ultrasound during this pregnancy yet concern for possible ectopic.  She has no history of kidney stones and lower suspicion for torsion.  Could be ruptured ovarian cyst as well.  Pelvic exam, urine and pelvic swabs pending.  Also will do an ultrasound to ensure none of the above.  Final Clinical Impressions(s) / ED Diagnoses   Final diagnoses:  None    ED Discharge Orders    None       Gwyneth SproutPlunkett, Averyanna Sax, MD 06/01/18 [phone]

## 2018-05-31 NOTE — ED Provider Notes (Signed)
Patient's ultrasound shows intrauterine pregnancy.  There is a small subchorionic hemorrhage and some of her pain may have come from an ovarian cyst.  I discussed with OB on-call about the measured heart rate, likely this is error as this would be pretty atypical at this age.  Either way there is nothing to do now and needs to follow-up with her OB.  We discussed this with patient and she appears stable for discharge home.  She still has some abdominal discomfort we discussed that if it worsens she needs to come here or see her OB.   Results for orders placed or performed during the hospital encounter of 05/31/18  Wet prep, genital  Result Value Ref Range   Yeast Wet Prep HPF POC NONE SEEN NONE SEEN   Trich, Wet Prep NONE SEEN NONE SEEN   Clue Cells Wet Prep HPF POC PRESENT (A) NONE SEEN   WBC, Wet Prep HPF POC FEW (A) NONE SEEN   Sperm NONE SEEN   hCG, quantitative, pregnancy  Result Value Ref Range   hCG, Beta Chain, Quant, S 36,665 (H) <5 mIU/mL  Urinalysis, Routine w reflex microscopic  Result Value Ref Range   Color, Urine YELLOW YELLOW   APPearance TURBID (A) CLEAR   Specific Gravity, Urine 1.017 1.005 - 1.030   pH 8.0 5.0 - 8.0   Glucose, UA NEGATIVE NEGATIVE mg/dL   Hgb urine dipstick NEGATIVE NEGATIVE   Bilirubin Urine NEGATIVE NEGATIVE   Ketones, ur NEGATIVE NEGATIVE mg/dL   Protein, ur NEGATIVE NEGATIVE mg/dL   Nitrite NEGATIVE NEGATIVE   Leukocytes,Ua NEGATIVE NEGATIVE   RBC / HPF 0-5 0 - 5 RBC/hpf   Bacteria, UA RARE (A) NONE SEEN   Amorphous Crystal PRESENT    [place] Ob Comp < 14 Wks  Result Date: 05/31/2018 CLINICAL DATA:  Pelvic pain EXAM: OBSTETRIC <14 WK US AND TRANSVAGINAL OB US TECHNIQUE: Both transabdominal and transvaginal ultrasound examinations were performed for complete evaluation of the gestation as well as the maternal uterus, adnexal regions, and pelvic cul-de-sac. Transvaginal technique was performed to assess early pregnancy. COMPARISON:  None. FINDINGS:  Intrauterine gestational sac: Visualized Yolk sac:  Visualized Embryo: Visualized Cardiac Activity: Visualized Heart Rate: 285 bpm CRL:  8 mm   6 w   5 d                  US EDC: November 19, 2019 Subchorionic hemorrhage: There is a 1.2 x 0.9 cm in size subchorionic hemorrhage. Maternal uterus/adnexae: Left ovary measures 2.7 x 2.6 x 1.7 cm. Right ovary measures 2.1 x 1.5 x 1.4 cm. There is no extrauterine pelvic or adnexal mass. There is a small amount of free pelvic fluid. IMPRESSION: Single live intrauterine gestation with estimated gestational age of 7-weeks. Measured heart rate is 285 beats per minute which is unusually rapid. Etiology for tachycardia in a fetus of this age is uncertain. Close clinical and imaging surveillance advised in this regard. There is a 1.2 x 0.9 cm in size subchorionic hemorrhage. No extrauterine pelvic mass evident. Small amount of free pelvic fluid may indicate recent small ovarian cyst rupture. Electronically Signed   By: Bretta BangWilliam  Woodruff III M.D.   On: 05/31/2018 17:14   [place] Ob Transvaginal  Result Date: 05/31/2018 CLINICAL DATA:  Pelvic pain EXAM: OBSTETRIC <14 WK US AND TRANSVAGINAL OB US TECHNIQUE: Both transabdominal and transvaginal ultrasound examinations were performed for complete evaluation of the gestation as well as the maternal uterus, adnexal regions, and pelvic cul-de-sac.  Transvaginal technique was performed to assess early pregnancy. COMPARISON:  None. FINDINGS: Intrauterine gestational sac: Visualized Yolk sac:  Visualized Embryo: Visualized Cardiac Activity: Visualized Heart Rate: 285 bpm CRL:  8 mm   6 w   5 d                  [place] EDC: November 19, 2019 Subchorionic hemorrhage: There is a 1.2 x 0.9 cm in size subchorionic hemorrhage. Maternal uterus/adnexae: Left ovary measures 2.7 x 2.6 x 1.7 cm. Right ovary measures 2.1 x 1.5 x 1.4 cm. There is no extrauterine pelvic or adnexal mass. There is a small amount of free pelvic fluid. IMPRESSION: Single live  intrauterine gestation with estimated gestational age of 7-weeks. Measured heart rate is 285 beats per minute which is unusually rapid. Etiology for tachycardia in a fetus of this age is uncertain. Close clinical and imaging surveillance advised in this regard. There is a 1.2 x 0.9 cm in size subchorionic hemorrhage. No extrauterine pelvic mass evident. Small amount of free pelvic fluid may indicate recent small ovarian cyst rupture. Electronically Signed   By: Bretta Bang III M.D.   On: 05/31/2018 17:14      Pricilla Loveless, MD 05/31/18 [phone]

## 2018-05-31 NOTE — ED Triage Notes (Signed)
Pt c/o right abd pains that are radiating down to pelvic area. reports she is around [redacted] weeks pregnant. Last pregnancy had bubble in in umbilical

## 2018-05-31 NOTE — Discharge Instructions (Addendum)
If you develop worsening, continued, or recurrent abdominal pain, uncontrolled vomiting, fever, chest or back pain, or any other new/concerning symptoms then return to the ER for evaluation.  

## 2018-06-04 LAB — GC/CHLAMYDIA PROBE AMP (~~LOC~~) NOT AT ARMC
Chlamydia: NEGATIVE
Neisseria Gonorrhea: NEGATIVE

## 2018-06-06 ENCOUNTER — Other Ambulatory Visit: Payer: Self-pay

## 2018-06-06 ENCOUNTER — Encounter: Payer: Self-pay | Admitting: *Deleted

## 2018-06-06 ENCOUNTER — Telehealth (INDEPENDENT_AMBULATORY_CARE_PROVIDER_SITE_OTHER): Payer: Self-pay | Admitting: *Deleted

## 2018-06-06 DIAGNOSIS — Z3480 Encounter for supervision of other normal pregnancy, unspecified trimester: Secondary | ICD-10-CM

## 2018-06-06 DIAGNOSIS — F1911 Other psychoactive substance abuse, in remission: Secondary | ICD-10-CM

## 2018-06-06 DIAGNOSIS — O0990 Supervision of high risk pregnancy, unspecified, unspecified trimester: Secondary | ICD-10-CM | POA: Insufficient documentation

## 2018-06-06 NOTE — Progress Notes (Signed)
I have reviewed the chart and agree with nursing staff's documentation of this patient's encounter.  [place] Bing, MD 06/06/2018 2:48 PM

## 2018-06-06 NOTE — Progress Notes (Signed)
I connected with  Nancy Henson on 06/06/18 at  9:15 AM EDT by telephone and verified that I am speaking with the correct person using two identifiers.   I discussed the limitations, risks, security and privacy concerns of performing an evaluation and management service by telephone and virtually and the availability of in person appointments. I also discussed with the patient that there may be a patient responsible charge related to this service. The patient expressed understanding and agreed to proceed.  I assisted her with downloading MyChart app and we connected virtually.  I assisted her with signing up for [facility] and informed her we will either send her a blood pressure cuff  Or give her one at her first visit if insurance not active and that we want her to take her blood pressure weekly and enter into app.   Danaisha Celli,RN 06/06/2018  9:16 AM

## 2018-06-21 ENCOUNTER — Encounter: Payer: Self-pay | Admitting: Obstetrics and Gynecology

## 2018-06-21 ENCOUNTER — Other Ambulatory Visit: Payer: Self-pay

## 2018-06-21 ENCOUNTER — Ambulatory Visit (INDEPENDENT_AMBULATORY_CARE_PROVIDER_SITE_OTHER): Payer: Self-pay | Admitting: Obstetrics and Gynecology

## 2018-06-21 VITALS — BP 132/84 | HR 95 | Wt 174.9 lb

## 2018-06-21 DIAGNOSIS — O99321 Drug use complicating pregnancy, first trimester: Secondary | ICD-10-CM

## 2018-06-21 DIAGNOSIS — Z3A10 10 weeks gestation of pregnancy: Secondary | ICD-10-CM

## 2018-06-21 DIAGNOSIS — Z3480 Encounter for supervision of other normal pregnancy, unspecified trimester: Secondary | ICD-10-CM

## 2018-06-21 DIAGNOSIS — F1410 Cocaine abuse, uncomplicated: Secondary | ICD-10-CM

## 2018-06-21 DIAGNOSIS — O0990 Supervision of high risk pregnancy, unspecified, unspecified trimester: Secondary | ICD-10-CM

## 2018-06-21 MED ORDER — PROMETHAZINE HCL 12.5 MG PO TABS: 12.5000 mg | ORAL_TABLET | Freq: Four times a day (QID) | ORAL | 0 refills | Status: DC | PRN

## 2018-06-21 NOTE — Progress Notes (Signed)
Primary care at [place]

## 2018-06-21 NOTE — Progress Notes (Signed)
New OB Note  06/21/2018   Clinic: [facility] [place]  Chief Complaint: NOB  Transfer of Care Patient: no  History of Present Illness: Ms. Nancy Henson is a 23 y.o. [id number] @ 10/2 weeks (EDC 1/6, based on Patient's last menstrual period was 04/10/2018.=6wk u/s).  Preg complicated by has Migraine headache without aura; Major depressive disorder, recurrent severe without psychotic features ([place]); Cocaine abuse ([place]); Negative pregnancy test; Supervision of high risk pregnancy, antepartum; and History of substance abuse ([place]) on their problem list.   Her periods were: qmonth, regular She was using no method when she conceived.  She has mild signs or symptoms of nausea/vomiting of pregnancy. She has Negative signs or symptoms of miscarriage or preterm labor On any medications around the time she conceived/early pregnancy: No   ROS: A 12-point review of systems was performed and negative, except as stated in the above HPI.  OBGYN History: As per HPI. OB History  Gravida Para Term Preterm AB Living  4 3 3     3   SAB TAB Ectopic Multiple Live Births        0 3    # Outcome Date GA Lbr Len/2nd Weight Sex Delivery Anes PTL Lv  4 Current           3 Term 10/10/14 [redacted]w[redacted]d 12:01 / 00:07 6 lb 10.7 oz (3.025 kg) M Vag-Spont EPI  LIV     Birth Comments: wnl  2 Term 10/11/13 [redacted]w[redacted]d 24:25 / 00:31 8 lb 2 oz (3.685 kg) M Vag-Spont EPI  LIV  1 Term 08/05/11 [redacted]w[redacted]d 24:55 / 00:31 6 lb 7 oz (2.92 kg) M Vag-Spont EPI  LIV    Any issues with any prior pregnancies: no Prior children are healthy, doing well, and without any problems or issues: yes History of pap smears: Yes. Last pap smear 2019 and results were negative per patient at [place] [facility]   Past Medical History: Past Medical History:  Diagnosis Date  . Anemia   . Anxiety   . Depression   . Headache     Past Surgical History: Past Surgical History:  Procedure Laterality Date  . NO PAST SURGERIES       Family History:  Family History  Problem Relation Age of Onset  . Alcohol abuse Neg Hx   . Arthritis Neg Hx   . Asthma Neg Hx   . Birth defects Neg Hx   . Cancer Neg Hx   . COPD Neg Hx   . Depression Neg Hx   . Diabetes Neg Hx   . Drug abuse Neg Hx   . Early death Neg Hx   . Hearing loss Neg Hx   . Heart disease Neg Hx   . Hyperlipidemia Neg Hx   . Hypertension Neg Hx   . Kidney disease Neg Hx   . Learning disabilities Neg Hx   . Mental illness Neg Hx   . Mental retardation Neg Hx   . Miscarriages / Stillbirths Neg Hx   . Stroke Neg Hx   . Vision loss Neg Hx   . Varicose Veins Neg Hx     She denies any history of mental retardation, birth defects or genetic disorders in her or the FOB's history  Social History:  Social History   Socioeconomic History  . Marital status: Married    Spouse name: Not on file  . Number of children: Not on file  . Years of education: Not on file  .  Highest education level: Not on file  Occupational History  . Not on file  Social Needs  . Financial resource strain: Not on file  . Food insecurity    Worry: Never true    Inability: Never true  . Transportation needs    Medical: No    Non-medical: No  Tobacco Use  . Smoking status: Current Every Day Smoker    Packs/day: 1.00    Types: Cigarettes  . Smokeless tobacco: Never Used  . Tobacco comment: refused  Substance and Sexual Activity  . Alcohol use: Not Currently    Alcohol/week: 12.0 - 18.0 standard drinks    Types: 12 - 18 Cans of beer per week    Comment: daily  . Drug use: Not Currently    Types: Cocaine, Marijuana    Comment: last used 6-8 months ago  . Sexual activity: Yes    Birth control/protection: None    Comment: would like information on tubal ligation  Lifestyle  . Physical activity    Days per week: Not on file    Minutes per session: Not on file  . Stress: Not on file  Relationships  . Social Musicianconnections    Talks on phone: Not on file    Gets  together: Not on file    Attends religious service: Not on file    Active member of club or organization: Not on file    Attends meetings of clubs or organizations: Not on file    Relationship status: Not on file  . Intimate partner violence    Fear of current or ex partner: Not on file    Emotionally abused: Not on file    Physically abused: Not on file    Forced sexual activity: Not on file  Other Topics Concern  . Not on file  Social History Narrative  . Not on file     Allergy: No Known Allergies  Health Maintenance:  Mammogram Up to Date: not applicable  Current Outpatient Medications: PNV  Physical Exam:   BP 132/84   Pulse 95   Wt 174 lb 14.4 oz (79.3 kg)   LMP 04/10/2018   BMI 30.02 kg/m  Body mass index is 30.02 kg/m. Contractions: Not present Vag. Bleeding: None. Fundal height: not applicable FHTs: 160s  General appearance: Well nourished, well developed female in no acute distress.  Neck:  Supple, normal appearance, and no thyromegaly  Cardiovascular: S1, S2 normal, no murmur, rub or gallop, regular rate and rhythm Respiratory:  Clear to auscultation bilateral. Normal respiratory effort Abdomen: positive bowel sounds and no masses, hernias; diffusely non tender to palpation, non distended Breasts: patient denies any breast s/s Neuro/Psych:  Normal mood and affect.  Skin:  Warm and dry.   Laboratory: none  Imaging:  Bedside u/s shows SLIUP c/w dates, normal FHR, +FM  Assessment: pt doing well  Plan: 1. Supervision of other normal pregnancy, antepartum Routine care. Phenergan for morning sickness. folllow up pap smear from PCP office. Pt declines genetics.  - Obstetric Panel, Including HIV - Culture, OB Urine - promethazine (PHENERGAN) 12.5 MG tablet; Take 1-2 tablets (12.5-25 mg total) by mouth every 6 (six) hours as needed for nausea or vomiting.  Dispense: 40 tablet; Refill: 0  2. Supervision of high risk pregnancy, antepartum - [id number]  11+Oxyco+Alc+Crt-Bund  3. Cocaine abuse [facility](HCC) - [id number] 11+Oxyco+Alc+Crt-Bund  Problem list reviewed and updated.  Follow up in 4 weeks.  >50% of 20 min visit spent on counseling and coordination of care.  Durene Romans MD Attending Center for [facility])

## 2018-06-22 LAB — DRUG SCREEN 764883 11+OXYCO+ALC+CRT-BUND
Amphetamines, Urine: NEGATIVE ng/mL
BENZODIAZ UR QL: NEGATIVE ng/mL
Barbiturate: NEGATIVE ng/mL
Cannabinoid Quant, Ur: NEGATIVE ng/mL
Cocaine (Metabolite): NEGATIVE ng/mL
Creatinine: 186.3 mg/dL (ref 20.0–300.0)
Ethanol: NEGATIVE %
Meperidine: NEGATIVE ng/mL
Methadone Screen, Urine: NEGATIVE ng/mL
OPIATE SCREEN URINE: NEGATIVE ng/mL
Oxycodone/Oxymorphone, Urine: NEGATIVE ng/mL
Phencyclidine: NEGATIVE ng/mL
Propoxyphene: NEGATIVE ng/mL
Tramadol: NEGATIVE ng/mL
pH, Urine: 7 (ref 4.5–8.9)

## 2018-06-23 LAB — OBSTETRIC PANEL, INCLUDING HIV
Antibody Screen: NEGATIVE
Basophils Absolute: 0 10*3/uL (ref 0.0–0.2)
Basos: 0 %
EOS (ABSOLUTE): 0.1 10*3/uL (ref 0.0–0.4)
Eos: 1 %
HIV Screen 4th Generation wRfx: NONREACTIVE
Hematocrit: 37.8 % (ref 34.0–46.6)
Hemoglobin: 12.7 g/dL (ref 11.1–15.9)
Hepatitis B Surface Ag: NEGATIVE
Immature Grans (Abs): 0.1 10*3/uL (ref 0.0–0.1)
Immature Granulocytes: 1 %
Lymphocytes Absolute: 1.2 10*3/uL (ref 0.7–3.1)
Lymphs: 17 %
MCH: 32 pg (ref 26.6–33.0)
MCHC: 33.6 g/dL (ref 31.5–35.7)
MCV: 95 fL (ref 79–97)
Monocytes Absolute: 0.3 10*3/uL (ref 0.1–0.9)
Monocytes: 5 %
Neutrophils Absolute: 5.2 10*3/uL (ref 1.4–7.0)
Neutrophils: 76 %
Platelets: 247 10*3/uL (ref 150–450)
RBC: 3.97 x10E6/uL (ref 3.77–5.28)
RDW: 12.1 % (ref 11.7–15.4)
RPR Ser Ql: NONREACTIVE
Rh Factor: POSITIVE
Rubella Antibodies, IGG: 1.91 index (ref >0.99)
WBC: 6.8 10*3/uL (ref 3.4–10.8)

## 2018-06-23 LAB — CULTURE, OB URINE

## 2018-06-23 LAB — URINE CULTURE, OB REFLEX: Organism ID, Bacteria: NO GROWTH

## 2018-07-16 ENCOUNTER — Telehealth: Payer: Self-pay | Admitting: General Practice

## 2018-07-16 NOTE — Telephone Encounter (Signed)
Received telephone call from [facility] for elevated blood pressure. Patient's blood pressures were 124/97 with repeat 121/85. Patient did report a headache at the time. Per chart review, patient has appt 7/10.   Discussed with Lysbeth Penner who states patient can continue to monitor blood pressures at home. No intervention needed at this time.

## 2018-07-18 ENCOUNTER — Telehealth: Payer: Self-pay | Admitting: Obstetrics and Gynecology

## 2018-07-18 NOTE — Telephone Encounter (Signed)
Spoke to patient about her appointment on 7/10 @ 9:15. Patient instructed to wear a face mask for the entire appointment and no visitors are allowed during the appointment. Patient screened for covid symptoms and denied having any

## 2018-07-19 ENCOUNTER — Ambulatory Visit (INDEPENDENT_AMBULATORY_CARE_PROVIDER_SITE_OTHER): Payer: Medicaid Other | Admitting: Obstetrics and Gynecology

## 2018-07-19 ENCOUNTER — Other Ambulatory Visit: Payer: Self-pay

## 2018-07-19 VITALS — BP 105/59 | HR 83 | Temp 98.5°F | Wt 174.6 lb

## 2018-07-19 DIAGNOSIS — O0992 Supervision of high risk pregnancy, unspecified, second trimester: Secondary | ICD-10-CM | POA: Diagnosis not present

## 2018-07-19 DIAGNOSIS — Z3A14 14 weeks gestation of pregnancy: Secondary | ICD-10-CM

## 2018-07-19 DIAGNOSIS — O0990 Supervision of high risk pregnancy, unspecified, unspecified trimester: Secondary | ICD-10-CM

## 2018-07-19 MED ORDER — MAGNESIUM MALATE 1250 (141.7 MG) MG PO TABS: 1.0000 | ORAL_TABLET | Freq: Every day | ORAL | Status: DC | PRN

## 2018-07-19 NOTE — Progress Notes (Signed)
Prenatal Visit Note Date: 07/19/2018 [facility]  Subjective:  Nancy Henson is a 23 y.o. [phone] at [redacted]w[redacted]d being seen today for ongoing prenatal care.  She is currently monitored for the following issues for this low-risk pregnancy and has Migraine headache without aura; Major depressive disorder, recurrent severe without psychotic features ([place]); Cocaine abuse ([place]); Supervision of high risk pregnancy, antepartum; and History of substance abuse ([place]) on their problem list.  Patient reports occasional migraines..   Contractions: Not present. Vag. Bleeding: None.  Movement: Absent. Denies leaking of fluid.   The following portions of the patient's history were reviewed and updated as appropriate: allergies, current medications, past family history, past medical history, past social history, past surgical history and problem list. Problem list updated.  Objective:   Vitals:   07/19/18 0940  BP: (!) 105/59  Pulse: 83  Temp: 98.5 F (36.9 C)  Weight: 174 lb 9.6 oz (79.2 kg)    Fetal Status: Fetal Heart Rate (bpm): 158   Movement: Absent     General:  Alert, oriented and cooperative. Patient is in no acute distress.  Skin: Skin is warm and dry. No rash noted.   Cardiovascular: Normal heart rate noted  Respiratory: Normal respiratory effort, no problems with respiration noted  Abdomen: Soft, gravid, appropriate for gestational age. Pain/Pressure: Absent     Pelvic:  Cervical exam deferred        Extremities: Normal range of motion.  Edema: None  Mental Status: Normal mood and affect. Normal behavior. Normal judgment and thought content.   Urinalysis:      Assessment and Plan:  Pregnancy: G4P3003 at [redacted]w[redacted]d  1. Supervision of high risk pregnancy, antepartum Routine care. Declines genetics. Mag malate qday prn for HAs. Pt doesn't endorse any cv/pulm issues  Preterm labor symptoms and general obstetric precautions including but not limited to vaginal  bleeding, contractions, leaking of fluid and fetal movement were reviewed in detail with the patient. Please refer to After Visit Summary for other counseling recommendations.  Return in about 1 month (around 08/21/2018).   Aletha Halim, MD

## 2018-07-19 NOTE — Progress Notes (Signed)
Pt states sometime her chest feels tight & like there's something stuck in it.

## 2018-07-25 ENCOUNTER — Encounter: Payer: Self-pay | Admitting: *Deleted

## 2018-08-20 ENCOUNTER — Telehealth: Payer: Self-pay | Admitting: Advanced Practice Midwife

## 2018-08-20 NOTE — Telephone Encounter (Signed)
Called the patient to confirm the upcoming in office visit scheduled. Informed the patient of our new location and no children or visitors due to covid19 restrictions. Also informed the patient of wearing a mask and using the hand sanitizer upon entry. The patient answered no the covid19 screening questions.

## 2018-08-21 ENCOUNTER — Other Ambulatory Visit (HOSPITAL_COMMUNITY): Payer: Self-pay | Admitting: *Deleted

## 2018-08-21 ENCOUNTER — Other Ambulatory Visit: Payer: Self-pay | Admitting: Obstetrics and Gynecology

## 2018-08-21 ENCOUNTER — Ambulatory Visit (INDEPENDENT_AMBULATORY_CARE_PROVIDER_SITE_OTHER): Payer: Medicaid Other | Admitting: Medical

## 2018-08-21 ENCOUNTER — Other Ambulatory Visit: Payer: Self-pay

## 2018-08-21 ENCOUNTER — Ambulatory Visit (HOSPITAL_COMMUNITY)
Admission: RE | Admit: 2018-08-21 | Discharge: 2018-08-21 | Disposition: A | Discharge status: Home / Self Care | Payer: Medicaid Other | Source: Ambulatory Visit | Attending: Obstetrics and Gynecology | Admitting: Obstetrics and Gynecology

## 2018-08-21 VITALS — BP 116/65 | HR 78 | Temp 98.6°F | Wt 176.2 lb

## 2018-08-21 DIAGNOSIS — Z3689 Encounter for other specified antenatal screening: Secondary | ICD-10-CM

## 2018-08-21 DIAGNOSIS — O0992 Supervision of high risk pregnancy, unspecified, second trimester: Secondary | ICD-10-CM

## 2018-08-21 DIAGNOSIS — O99332 Smoking (tobacco) complicating pregnancy, second trimester: Secondary | ICD-10-CM

## 2018-08-21 DIAGNOSIS — F1911 Other psychoactive substance abuse, in remission: Secondary | ICD-10-CM

## 2018-08-21 DIAGNOSIS — F1410 Cocaine abuse, uncomplicated: Secondary | ICD-10-CM

## 2018-08-21 DIAGNOSIS — Z3480 Encounter for supervision of other normal pregnancy, unspecified trimester: Secondary | ICD-10-CM

## 2018-08-21 DIAGNOSIS — Z3A19 19 weeks gestation of pregnancy: Secondary | ICD-10-CM

## 2018-08-21 DIAGNOSIS — O0990 Supervision of high risk pregnancy, unspecified, unspecified trimester: Secondary | ICD-10-CM

## 2018-08-21 DIAGNOSIS — O99312 Alcohol use complicating pregnancy, second trimester: Secondary | ICD-10-CM

## 2018-08-21 DIAGNOSIS — Z363 Encounter for antenatal screening for malformations: Secondary | ICD-10-CM

## 2018-08-21 MED ORDER — BLOOD PRESSURE MONITORING DEVI: 1.0000 | 0 refills | Status: DC

## 2018-08-21 NOTE — Addendum Note (Signed)
Addended by: Bethanne Ginger on: 08/21/2018 12:00 PM   Modules accepted: Orders

## 2018-08-21 NOTE — Patient Instructions (Signed)
BENEFITS OF BREASTFEEDING Many women wonder if they should breastfeed. Research shows that breast milk contains the perfect balance of vitamins, protein and fat that your baby needs to grow. It also contains antibodies that help your baby's immune system to fight off viruses and bacteria and can reduce the risk of sudden infant death syndrome (SIDS). In addition, the colostrum (a fluid secreted from the breast in the first few days after delivery) helps your newborn's digestive system to grow and function well. Breast milk is easier to digest than formula. Also, if your baby is born preterm, breast milk can help to reduce both short- and long-term health problems. BENEFITS OF BREASTFEEDING FOR MOM . Breastfeeding causes a hormone to be released that helps the uterus to contract and return to its normal size more quickly. . It aids in postpartum weight loss, reduces risk of breast and ovarian cancer, heart disease and rheumatoid arthritis. . It decreases the amount of bleeding after the baby is born. benefits of breastfeeding for baby . Provides comfort and nutrition . Protects baby against - Obesity - Diabetes - Asthma - Childhood cancers - Heart disease - Ear infections - Diarrhea - Pneumonia - Stomach problems - Serious allergies - Skin rashes . Promotes growth and development . Reduces the risk of baby having Sudden Infant Death Syndrome (SIDS) only breastmilk for the first 6 months . Protects baby against diseases/allergies . It's the perfect amount for tiny bellies . It restores baby's energy . Provides the best nutrition for baby . Giving water or formula can make baby more likely to get sick, decrease Mom's milk supply, make baby less content with breastfeeding Skin to Skin After delivery, the staff will place your baby on your chest. This helps with the following: . Regulates baby's temperature, breathing, heart rate and blood sugar . Increases Mom's milk supply . Promotes  bonding . Keeps baby and Mom calm and decreases baby's crying Rooming In Your baby will stay in your room with you for the entire time you are in the hospital. This helps with the following: . Allows Mom to learn baby's feeding cues - Fluttering eyes - Sucking on tongue or hand - Rooting (opens mouth and turns head) - Nuzzling into the breast - Bringing hand to mouth . Allows breastfeeding on demand (when your baby is ready) . Helps baby to be calm and content . Ensures a good milk supply . Prevents complications with breastfeeding . Allows parents to learn to care for baby . Allows you to request assistance with breastfeeding Importance of a good latch . Increases milk transfer to baby - baby gets enough milk . Ensures you have enough milk for your baby . Decreases nipple soreness . Don't use pacifiers and bottles - these cause baby to suck differently than breastfeeding . Promotes continuation of breastfeeding Risks of Formula Supplementation with Breastfeeding Giving your infant formula in addition to your breast-milk EXCEPT when medically necessary can lead to: Marland Kitchen Decreases your milk supply  . Loss of confidence in yourself for providing baby's nutrition  . Engorgement and possibly mastitis  . Asthma & allergies in the baby BREASTFEEDING FAQS How long should I breastfeed my baby? It is recommended that you provide your baby with breast milk only for the first 6 months and then continue for the first year and longer as desired. During the first few weeks after birth, your baby will need to feed 8-12 times every 24 hours, or every 2-3 hours. They will likely feed  for 15-30 minutes. How can I help my baby begin breastfeeding? Babies are born with an instinct to breastfeed. A healthy baby can begin breastfeeding right away without specific help. At the hospital, a nurse (or lactation consultant) will help you begin the process and will give you tips on good positioning. It may be  helpful to take a breastfeeding class before you deliver in order to know what to expect. How can I help my baby latch on? In order to assist your baby in latching-on, cup your breast in your hand and stroke your baby's lower lip with your nipple to stimulate your baby's rooting reflex. Your baby will look like he or she is yawning, at which point you should bring the baby towards your breast, while aiming the nipple at the roof of his or her mouth. Remember to bring the baby towards you and not your breast towards the baby. How can I tell if my baby is latched-on? Your baby will have all of your nipple and part of the dark area around the nipple in his or her mouth and your baby's nose will be touching your breast. You should see or hear the baby swallowing. If the baby is not latched-on properly, start the process over. To remove the suction, insert a clean finger between your breast and the baby's mouth. Should I switch breasts during feeding? After feeding on one side, switch the baby to your other breast. If he or she does not continue feeding - that is OK. Your baby will not necessarily need to feed from both breasts in a single feeding. On the next feeding, start with the other breast for efficiency and comfort. How can I tell if my baby is hungry? When your baby is hungry, they will nuzzle against your breast, make sucking noises and tongue motions and may put their hands near their mouth. Crying is a late sign of hunger, so you should not wait until this point. When they have received enough milk, they will unlatch from the breast. Is it okay to use a pacifier? Until your baby gets the hang of breastfeeding, experts recommend limiting pacifier usage. If you have questions about this, please contact your pediatrician. What can I do to ensure proper nutrition while breastfeeding? . Make sure that you support your own health and your baby's by eating a healthy, well-balanced diet . Your provider  may recommend that you continue to take your prenatal vitamin . Drink plenty of fluids. It is a good rule to drink one glass of water before or after feeding . Alcohol will remain in the breast milk for as long as it will remain in the blood stream. If you choose to have a drink, it is recommended that you wait at least 2 hours before feeding . Moderate amounts of caffeine are OK . Some over-the-counter or prescription medications are not recommended during breastfeeding. Check with your provider if you have questions What types of birth control methods are safe while breastfeeding? Progestin-only methods, including a daily pill, an IUD, the implant and the injection are safe while breastfeeding. Methods that contain estrogen (such as combination birth control pills, the vaginal ring and the patch) should not be used during the first month of breastfeeding as these can decrease your milk supply.  Childbirth Education Options: [facility] Department Classes:  Childbirth education classes can help you get ready for a positive parenting experience. You can also meet other expectant parents and get free stuff for  your baby. Each class runs for five weeks on the same night and costs $45 for the mother-to-be and her support person. Medicaid covers the cost if you are eligible. Call [phone] to register. [facility] Childbirth Education:  [phone] or [phone] or [email or web address]  Baby & Me Class: Discuss newborn & infant parenting and family adjustment issues with other new mothers in a relaxed environment. Each week brings a new speaker or baby-centered activity. We encourage new mothers to join us every Thursday at 11:00am. Babies birth until crawling. No registration or fee. Daddy Boot Camp: This course offers Dads-to-be the tools and knowledge needed to feel confident on their journey to becoming new fathers. Experienced dads, who have been trained as coaches, teach  dads-to-be how to hold, comfort, diaper, swaddle and play with their infant while being able to support the new mom as well. A class for men taught by men. $25/dad Big Brother/Big Sister: Let your children share in the joy of a new brother or sister in this special class designed just for them. Class includes discussion about how families care for babies: swaddling, holding, diapering, safety as well as how they can be helpful in their new role. This class is designed for children ages 2 to 6, but any age is welcome. Please register each child individually. $5/child  Mom Talk: This mom-led group offers support and connection to mothers as they journey through the adjustments and struggles of that sometimes overwhelming first year after the birth of a child. Tuesdays at 10:00am and Thursdays at 6:00pm. Babies welcome. No registration or fee. Breastfeeding Support Group: This group is a mother-to-mother support circle where moms have the opportunity to share their breastfeeding experiences. A Lactation Consultant is present for questions and concerns. Meets each Tuesday at 11:00am. No fee or registration. Breastfeeding Your Baby: Learn what to expect in the first days of breastfeeding your newborn.  This class will help you feel more confident with the skills needed to begin your breastfeeding experience. Many new mothers are concerned about breastfeeding after leaving the hospital. This class will also address the most common fears and challenges about breastfeeding during the first few weeks, months and beyond. (call for fee) Comfort Techniques and Tour: This 2 hour interactive class will provide you the opportunity to learn & practice hands-on techniques that can help relieve some of the discomfort of labor and encourage your baby to rotate toward the best position for birth. You and your partner will be able to try a variety of labor positions with birth balls and rebozos as well as practice breathing,  relaxation, and visualization techniques. A tour of the [facility] is included with this class. $20 per registrant and support person Childbirth Class- Weekend Option: This class is a Weekend version of our Birth & Baby series. It is designed for parents who have a difficult time fitting several weeks of classes into their schedule. It covers the care of your newborn and the basics of labor and childbirth. It also includes a Maternity Care Center Tour of [facility] and lunch. The class is held two consecutive days: beginning on Friday evening from 6:30 - 8:30 p.m. and the next day, Saturday from 9 a.m. - 4 p.m. (call for fee) Waterbirth Class: Interested in a waterbirth?  This informational class will help you discover whether waterbirth is the right fit for you. Education about waterbirth itself, supplies you would need and how to assemble your support team is what you can   expect from this class. Some obstetrical practices require this class in order to pursue a waterbirth. (Not all obstetrical practices offer waterbirth-check with your healthcare provider.) Register only the expectant mom, but you are encouraged to bring your partner to class! Required if planning waterbirth, no fee. Infant/Child CPR: Parents, grandparents, babysitters, and friends learn Cardio-Pulmonary Resuscitation skills for infants and children. You will also learn how to treat both conscious and unconscious choking in infants and children. This Family & Friends program does not offer certification. Register each participant individually to ensure that enough mannequins are available. (Call for fee) Grandparent Love: Expecting a grandbaby? This class is for you! Learn about the latest infant care and safety recommendations and ways to support your own child as he or she transitions into the parenting role. Taught by Registered Nurses who are childbirth instructors, but most importantly...they are  grandmothers too! $10/person. Childbirth Class- Natural Childbirth: This series of 5 weekly classes is for expectant parents who want to learn and practice natural methods of coping with the process of labor and childbirth. Relaxation, breathing, massage, visualization, role of the partner, and helpful positioning are highlighted. Participants learn how to be confident in their body's ability to give birth. This class will empower and help parents make informed decisions about their own care. Includes discussion that will help new parents transition into the immediate postpartum period. [facility] is included. We suggest taking this class between 25-32 weeks, but it's only a recommendation. $75 per registrant and one support person or $30 Medicaid. Childbirth Class- 3 week Series: This option of 3 weekly classes helps you and your labor partner prepare for childbirth. Newborn care, labor & birth, cesarean birth, pain management, and comfort techniques are discussed and a [place] of [facility] is included. The class meets at the same time, on the same day of the week for 3 consecutive weeks beginning with the starting date you choose. $60 for registrant and one support person.  Marvelous Multiples: Expecting twins, triplets, or more? This class covers the differences in labor, birth, parenting, and breastfeeding issues that face multiples' parents. NICU tour is included. Led by a Certified Childbirth Educator who is the mother of twins. No fee. Caring for Baby: This class is for expectant and adoptive parents who want to learn and practice the most up-to-date newborn care for their babies. Focus is on birth through the first six weeks of life. Topics include feeding, bathing, diapering, crying, umbilical cord care, circumcision care and safe sleep. Parents learn to recognize symptoms of illness and when to call the pediatrician. Register only the  mom-to-be and your partner or support person can plan to come with you! $10 per registrant and support person Childbirth Class- online option: This online class offers you the freedom to complete a Birth and Baby series in the comfort of your own home. The flexibility of this option allows you to review sections at your own pace, at times convenient to you and your support people. It includes additional video information, animations, quizzes, and extended activities. Get organized with helpful eClass tools, checklists, and trackers. Once you register online for the class, you will receive an email within a few days to accept the invitation and begin the class when the time is right for you. The content will be available to you for 60 days. $60 for 60 days of online access for you and your support people.  Local Doulas: Natural Baby Doulas  [email or web address] Tel: [phone] [email or web address] [email or web address] [phone] [email or web address] The [employer] Ladies  (also do waterbirth tub rental) [phone] [email or web address] [email or web address] [phone] [email or web address] [employer]hyth[employer]  [phone] [email or web address] [place] [employer] (PADA) [email or web address].org/index.htm [place] [facility]  [email or web address] Considering Waterbirth? Guide for patients at [facility]  Why consider waterbirth?  . Gentle birth for babies . Less pain medicine used in labor . May allow for passive descent/less pushing . May reduce perineal tears  . More mobility and instinctive maternal position changes . Increased maternal relaxation . Reduced blood pressure in labor  Is waterbirth safe? What are the risks of infection, drowning or other complications?  . Infection: o Very  low risk (3.7 % for tub vs 4.8% for bed) o 7 in 8000 waterbirths with documented infection o Poorly cleaned equipment most common cause o Slightly lower group B strep transmission rate  . Drowning o Maternal:  - Very low risk   - Related to seizures or fainting o Newborn:  - Very low risk. No evidence of increased risk of respiratory problems in multiple large studies - Physiological protection from breathing under water - Avoid underwater birth if there are any fetal complications - Once baby's head is out of the water, keep it out.  . Birth complication o Some reports of cord trauma, but risk decreased by bringing baby to surface gradually o No evidence of increased risk of shoulder dystocia. Mothers can usually change positions faster in water than in a bed, possibly aiding the maneuvers to free the shoulder.   You must attend a Waterbirth class at [facility]  3rd Wednesday of every month from 7-9pm  Free  Register by calling [phone] or online at [email or web address]  Bring us the certificate from the class to your prenatal appointment  Meet with a midwife at 36 weeks to see if you can still plan a waterbirth and to sign the consent.   Purchase or rent the following supplies:   Water Birth Pool (Birth Pool in a Box or [place] for instance)  (Tubs start ~$125)  Single-use disposable tub liner designed for your brand of tub  New garden hose labeled "lead-free", "suitable for drinking water",  Electric drain pump to remove water (We recommend 792 gallon per hour or greater pump.)   Separate garden hose to remove the dirty water  Fish net  Bathing suit top (optional)  Long-handled mirror (optional)  Places to purchase or rent supplies  [email or web address] for tub purchases and supplies  [email or web address] for tub purchases and supplies  The [employer] Ladies ([email or web address]) $275 for tub rental/set-up & take down/kit   [place] [employer]  [employer] (http://www.padanc.org/MeetUs.htm) Information regarding doulas (labor support) who provide pool rentals  Our practice has a Birth Pool in a Box tub at the hospital that you may borrow on a first-come-first-served basis. It is your responsibility to to set up, clean and break down the tub. We cannot guarantee the availability of this tub in advance. You are responsible for bringing all accessories listed above. If you do not have all necessary supplies you cannot have a waterbirth.    Things that would prevent you from having a waterbirth:  Premature, <37wks  Previous cesarean birth  Presence of thick meconium-stained fluid  Multiple gestation (Twins, triplets, etc.)  Uncontrolled diabetes or gestational diabetes requiring medication  Hypertension requiring medication or diagnosis of pre-eclampsia  Heavy vaginal bleeding  Non-reassuring fetal heart rate  Active infection (MRSA, etc.). Group   B Strep is NOT a contraindication for  waterbirth.  If your labor has to be induced and induction method requires continuous  monitoring of the baby's heart rate  Other risks/issues identified by your obstetrical provider  Please remember that birth is unpredictable. Under certain unforeseeable circumstances your provider may advise against giving birth in the tub. These decisions will be made on a case-by-case basis and with the safety of you and your baby as our highest priority.      

## 2018-08-21 NOTE — Progress Notes (Signed)
Ordered BP Cuff thru [facility]-08/21/2018

## 2018-08-21 NOTE — Progress Notes (Signed)
   PRENATAL VISIT NOTE  Subjective:  Nancy Henson is a 23 y.o. G4P3003 at [redacted]w[redacted]d being seen today for ongoing prenatal care.  She is currently monitored for the following issues for this high-risk pregnancy and has Migraine headache without aura; Major depressive disorder, recurrent severe without psychotic features ([place]); Cocaine abuse ([place]); Supervision of high risk pregnancy, antepartum; and History of substance abuse ([place]) on their problem list.  Patient reports no complaints. Reports feeling a little tired. She denies any illegal drug use. She last drank some wine about 2-3 weeks ago. She continues to smoke tobacco. Contractions: Not present. Vag. Bleeding: None.  Movement: Present. Denies leaking of fluid.   The following portions of the patient's history were reviewed and updated as appropriate: allergies, current medications, past family history, past medical history, past social history, past surgical history and problem list.   Objective:   Vitals:   08/21/18 1008  BP: 116/65  Pulse: 78  Temp: 98.6 F (37 C)  Weight: 176 lb 3.2 oz (79.9 kg)    Fetal Status: Fetal Heart Rate (bpm): 162   Movement: Present     General:  Alert, oriented and cooperative. Patient is in no acute distress.  Skin: Skin is warm and dry. No rash noted.   Cardiovascular: Normal heart rate noted  Respiratory: Normal respiratory effort, no problems with respiration noted  Abdomen: Soft, gravid, appropriate for gestational age.  Pain/Pressure: Absent     Pelvic: Cervical exam deferred        Extremities: Normal range of motion.  Edema: Trace  Mental Status: Normal mood and affect. Normal behavior. Normal judgment and thought content.   Assessment and Plan:  Pregnancy: G4P3003 at [redacted]w[redacted]d 1. Supervision of high risk pregnancy, antepartum - Genetic Screening Ordered today - Anatomy US today - Virtual visit in 4 weeks - Will ask front desk to f/u on Pap results that were requested   2. History of  substance abuse ([place]) - Continues to smoke tobacco - No alcohol use for past 3 weeks  3. Cocaine abuse ([place]) - Denies illegal drug use   Preterm labor symptoms and general obstetric precautions including but not limited to vaginal bleeding, contractions, leaking of fluid and fetal movement were reviewed in detail with the patient. Please refer to After Visit Summary for other counseling recommendations.   Return in about 4 weeks (around 09/18/2018) for [facility]; virtual .  No future appointments.  Chauncey Mann, MD

## 2018-08-29 ENCOUNTER — Telehealth: Payer: Self-pay | Admitting: Obstetrics & Gynecology

## 2018-08-29 NOTE — Telephone Encounter (Signed)
Attempted to reach patient about her appointment change. Left a voicemail message for her to call us back.

## 2018-08-30 ENCOUNTER — Encounter: Payer: Self-pay | Admitting: General Practice

## 2018-09-02 ENCOUNTER — Telehealth: Payer: Self-pay

## 2018-09-02 NOTE — Telephone Encounter (Signed)
Notified pt that we have her bp cuff from [facility] and asked when she will be able to come in to pick up.  Pt stated that she will be able to come tomorrow 09/03/18 to pick BP cuff.

## 2018-09-18 ENCOUNTER — Telehealth: Payer: Medicaid Other | Admitting: Medical

## 2018-09-25 ENCOUNTER — Telehealth: Payer: Self-pay | Admitting: Obstetrics and Gynecology

## 2018-09-25 NOTE — Telephone Encounter (Signed)
Called the patient to inform of the upcoming mychart visit. Informed the patient of expecting a phone call from our office with information of when to log in. The patient verbalized understanding. °

## 2018-09-26 ENCOUNTER — Telehealth (INDEPENDENT_AMBULATORY_CARE_PROVIDER_SITE_OTHER): Payer: Medicaid Other | Admitting: Obstetrics & Gynecology

## 2018-09-26 ENCOUNTER — Other Ambulatory Visit: Payer: Self-pay

## 2018-09-26 DIAGNOSIS — F1410 Cocaine abuse, uncomplicated: Secondary | ICD-10-CM

## 2018-09-26 DIAGNOSIS — O0992 Supervision of high risk pregnancy, unspecified, second trimester: Secondary | ICD-10-CM

## 2018-09-26 DIAGNOSIS — O0990 Supervision of high risk pregnancy, unspecified, unspecified trimester: Secondary | ICD-10-CM

## 2018-09-26 DIAGNOSIS — Z3A24 24 weeks gestation of pregnancy: Secondary | ICD-10-CM

## 2018-09-26 DIAGNOSIS — O99322 Drug use complicating pregnancy, second trimester: Secondary | ICD-10-CM

## 2018-09-26 DIAGNOSIS — F1911 Other psychoactive substance abuse, in remission: Secondary | ICD-10-CM

## 2018-09-26 DIAGNOSIS — F332 Major depressive disorder, recurrent severe without psychotic features: Secondary | ICD-10-CM

## 2018-09-26 NOTE — Progress Notes (Signed)
I connected with  Nancy Henson on 09/26/18 at  8:35 AM EDT by telephone and verified that I am speaking with the correct person using two identifiers.   I discussed the limitations, risks, security and privacy concerns of performing an evaluation and management service by telephone and the availability of in person appointments. I also discussed with the patient that there may be a patient responsible charge related to this service. The patient expressed understanding and agreed to proceed.   Verdell Carmine, RN 09/26/2018  8:37 AM

## 2018-09-26 NOTE — Patient Instructions (Signed)
Postpartum Tubal Ligation Postpartum tubal ligation (PPTL) is a procedure to close the fallopian tubes. This is done so that you cannot get pregnant. When the fallopian tubes are closed, the eggs that the ovaries release cannot enter the uterus, and sperm cannot reach the eggs. PPTL is done right after childbirth or 1-2 days after childbirth, before the uterus returns to its normal location. If you have a cesarean section, it can be performed at the same time as the procedure. Having this done after childbirth does not make your stay in the hospital longer. PPTL is sometimes called "getting your tubes tied." You should not have this procedure if you want to get pregnant again or if you are unsure about having more children. Tell a health care provider about:  Any allergies you have.  All medicines you are taking, including vitamins, herbs, eye drops, creams, and over-the-counter medicines.  Any problems you or family members have had with anesthetic medicines.  Any blood disorders you have.  Any surgeries you have had.  Any medical conditions you have or have had.  Any past pregnancies. What are the risks? Generally, this is a safe procedure. However, problems may occur, including:  Infection.  Bleeding.  Injury to other organs in the abdomen.  Side effects from anesthetic medicines.  Failure of the procedure. If this happens, you could get pregnant.  Having a fertilized egg attach outside the uterus (ectopic pregnancy). What happens before the procedure?  Ask your health care provider about: ? How much pain you can expect to have. ? What medicines you will be given for pain, especially if you are planning to breastfeed. What happens during the procedure? If you had a vaginal delivery:  You will be given one or more of the following: ? A medicine to help you relax (sedative). ? A medicine to numb the area (local anesthetic). ? A medicine to make you fall asleep (general  anesthetic). ? A medicine that is injected into an area of your body to numb everything below the injection site (regional anesthetic).  If you have been given a general anesthetic, a tube will be put down your throat to help you breathe.  An IV will be inserted into one of your veins.  Your bladder may be emptied with a small tube (catheter).  An incision will be made just below your belly button.  Your fallopian tubes will be located and brought up through the incision.  Your fallopian tubes will be tied off, burned (cauterized), or blocked with a clip, ring, or clamp. A small part in the center of each fallopian tube may be removed.  The incision will be closed with stitches (sutures).  A bandage (dressing) will be placed over the incision. If you had a cesarean delivery:  Tubal ligation will be done through the incision that was used for the cesarean delivery of your baby.  The incision will be closed with sutures.  A dressing will be placed over the incision. The procedure may vary among health care providers and hospitals. What happens after the procedure?  Your blood pressure, heart rate, breathing rate, and blood oxygen level will be monitored until you leave the hospital.  You will be given pain medicine as needed.  Do not drive for 24 hours if you were given a sedative during your procedure. Summary  Postpartum tubal ligation is a procedure that closes the fallopian tubes so you cannot get pregnant anymore.  This procedure is done while you are still   in the hospital after childbirth. If you have a cesarean section, it can be performed at the same time.  Having this done after childbirth does not make your stay in the hospital longer.  Postpartum tubal ligation is considered permanent. You should not have this procedure if you want to get pregnant again or if you are unsure about having more children.  Talk to your health care provider to see if this procedure is  right for you. This information is not intended to replace advice given to you by your health care provider. Make sure you discuss any questions you have with your health care provider. Document Released: 12/26/2004 Document Revised: 11/15/2017 Document Reviewed: 11/15/2017 Elsevier Patient Education  2020 [employer]  

## 2018-09-26 NOTE — Progress Notes (Signed)
   TELEHEALTH OBSTETRICS PRENATAL VIRTUAL VIDEO VISIT ENCOUNTER NOTE  Provider location: Center for [employer] at [facility]   I connected with Orpah Clinton on 09/26/18 at  8:35 AM EDT by MyChart Video Encounter at home and verified that I am speaking with the correct person using two identifiers.   I discussed the limitations, risks, security and privacy concerns of performing an evaluation and management service virtually and the availability of in person appointments. I also discussed with the patient that there may be a patient responsible charge related to this service. The patient expressed understanding and agreed to proceed. Subjective:  Nancy Henson is a 23 y.o. G4P3003 at [redacted]w[redacted]d being seen today for ongoing prenatal care.  She is currently monitored for the following issues for this high-risk pregnancy and has Migraine headache without aura; Major depressive disorder, recurrent severe without psychotic features ([place]); Cocaine abuse ([place]); Supervision of high risk pregnancy, antepartum; and History of substance abuse ([place]) on their problem list.  Patient reports no complaints.  Contractions: Not present. Vag. Bleeding: None.  Movement: Present. Denies any leaking of fluid.   The following portions of the patient's history were reviewed and updated as appropriate: allergies, current medications, past family history, past medical history, past social history, past surgical history and problem list.   Objective:  There were no vitals filed for this visit.  Fetal Status:     Movement: Present     General:  Alert, oriented and cooperative. Patient is in no acute distress.  Respiratory: Normal respiratory effort, no problems with respiration noted  Mental Status: Normal mood and affect. Normal behavior. Normal judgment and thought content.  Rest of physical exam deferred due to type of encounter  Imaging: No results found.  Assessment and Plan:  Pregnancy: G4P3003  at [redacted]w[redacted]d 1. Supervision of high risk pregnancy, antepartum Good FM Pt has BP cuff but, it reading 'low battery'. Pt will get batteries today  Has [place] Oct 8th    2. Major depressive disorder, recurrent severe without psychotic features ([place]) Stopped the Zoloft prior to this pregnancy.    3. History of substance abuse ([place]) Last use 6 months prior to pregnancy.  Pt has a good support system  Preterm labor symptoms and general obstetric precautions including but not limited to vaginal bleeding, contractions, leaking of fluid and fetal movement were reviewed in detail with the patient. I discussed the assessment and treatment plan with the patient. The patient was provided an opportunity to ask questions and all were answered. The patient agreed with the plan and demonstrated an understanding of the instructions. The patient was advised to call back or seek an in-person office evaluation/go to MAU at [facility] for any urgent or concerning symptoms. Please refer to After Visit Summary for other counseling recommendations.   I provided 12 minutes of face-to-face time during this encounter.  Return in about 4 weeks (around 10/24/2018) for in person.  Future Appointments  Date Time Provider [place]  10/16/2018  1:15 PM WH-MFC [place] 4 WH-MFCUS MFC-US    Payson Crumby Harraway-Smith, [place] for [facility], [place]

## 2018-10-14 ENCOUNTER — Telehealth: Payer: Self-pay | Admitting: Family Medicine

## 2018-10-14 NOTE — Telephone Encounter (Signed)
Received a call from the patient stating she can only do her appointments after 12:00 pm.

## 2018-10-16 ENCOUNTER — Telehealth: Payer: Self-pay | Admitting: Family Medicine

## 2018-10-16 ENCOUNTER — Ambulatory Visit (HOSPITAL_COMMUNITY): Payer: Medicaid Other

## 2018-10-16 NOTE — Telephone Encounter (Signed)
Patient has questions please call her today.

## 2018-10-16 NOTE — Telephone Encounter (Signed)
Patient called about her BTL papers. Would like a call back from clinical staff. She has questions.

## 2018-10-17 NOTE — Telephone Encounter (Signed)
Called to inform her that she would need to get her paper work from her employer and bring it to [place].

## 2018-10-18 ENCOUNTER — Other Ambulatory Visit: Payer: Self-pay

## 2018-10-18 ENCOUNTER — Ambulatory Visit (HOSPITAL_COMMUNITY)
Admission: RE | Admit: 2018-10-18 | Discharge: 2018-10-18 | Disposition: A | Discharge status: Home / Self Care | Payer: Medicaid Other | Source: Ambulatory Visit | Attending: Maternal & Fetal Medicine | Admitting: Maternal & Fetal Medicine

## 2018-10-18 DIAGNOSIS — Z3689 Encounter for other specified antenatal screening: Secondary | ICD-10-CM | POA: Diagnosis present

## 2018-10-18 DIAGNOSIS — O99312 Alcohol use complicating pregnancy, second trimester: Secondary | ICD-10-CM

## 2018-10-18 DIAGNOSIS — Z362 Encounter for other antenatal screening follow-up: Secondary | ICD-10-CM

## 2018-10-18 DIAGNOSIS — Z3A27 27 weeks gestation of pregnancy: Secondary | ICD-10-CM | POA: Diagnosis not present

## 2018-10-18 DIAGNOSIS — O99332 Smoking (tobacco) complicating pregnancy, second trimester: Secondary | ICD-10-CM | POA: Diagnosis not present

## 2018-10-25 ENCOUNTER — Inpatient Hospital Stay (HOSPITAL_COMMUNITY)
Admission: AD | Admit: 2018-10-25 | Discharge: 2018-10-25 | Disposition: A | Discharge status: Home / Self Care | Payer: Medicaid Other | Attending: Obstetrics & Gynecology | Admitting: Obstetrics & Gynecology

## 2018-10-25 ENCOUNTER — Other Ambulatory Visit: Payer: Self-pay

## 2018-10-25 ENCOUNTER — Encounter (HOSPITAL_COMMUNITY): Payer: Self-pay | Admitting: *Deleted

## 2018-10-25 DIAGNOSIS — O26893 Other specified pregnancy related conditions, third trimester: Secondary | ICD-10-CM | POA: Diagnosis present

## 2018-10-25 DIAGNOSIS — O479 False labor, unspecified: Secondary | ICD-10-CM

## 2018-10-25 DIAGNOSIS — Z3A28 28 weeks gestation of pregnancy: Secondary | ICD-10-CM | POA: Insufficient documentation

## 2018-10-25 DIAGNOSIS — O99333 Smoking (tobacco) complicating pregnancy, third trimester: Secondary | ICD-10-CM | POA: Diagnosis not present

## 2018-10-25 DIAGNOSIS — O4703 False labor before 37 completed weeks of gestation, third trimester: Secondary | ICD-10-CM | POA: Diagnosis not present

## 2018-10-25 DIAGNOSIS — F17210 Nicotine dependence, cigarettes, uncomplicated: Secondary | ICD-10-CM | POA: Diagnosis not present

## 2018-10-25 HISTORY — DX: Unspecified ovarian cyst, unspecified side: N83.209

## 2018-10-25 LAB — URINALYSIS, ROUTINE W REFLEX MICROSCOPIC
Bilirubin Urine: NEGATIVE
Glucose, UA: NEGATIVE mg/dL
Hgb urine dipstick: NEGATIVE
Ketones, ur: NEGATIVE mg/dL
Leukocytes,Ua: NEGATIVE
Nitrite: NEGATIVE
Protein, ur: NEGATIVE mg/dL
Specific Gravity, Urine: 1.017 (ref 1.005–1.030)
pH: 7 (ref 5.0–8.0)

## 2018-10-25 NOTE — Discharge Instructions (Signed)

## 2018-10-25 NOTE — MAU Provider Note (Signed)
History     CSN: [id number]  Arrival date and time: 10/25/18 1826   First Provider Initiated Contact with Patient 10/25/18 1854      Chief Complaint  Patient presents with  . Abdominal Pain   HPI  Ms. Nancy Henson is a 23 y.o. G4P3003 at [redacted]w[redacted]d who presents to MAU today with complaint of contractions. She states that contractions started at 0900 this morning. She has noted 3-4 in the last hour. She denies vaginal bleeding, LOF, previous PTL or UTI symptoms. She has had nausea without vomiting or diarrhea. She reports normal fetal movement.   OB History    Gravida  4   Para  3   Term  3   Preterm      AB      Living  3     SAB      TAB      Ectopic      Multiple  0   Live Births  3           Past Medical History:  Diagnosis Date  . Anemia   . Anxiety   . Depression    doing well  . Headache   . Ovarian cyst     Past Surgical History:  Procedure Laterality Date  . NO PAST SURGERIES      Family History  Problem Relation Age of Onset  . Healthy Mother   . Alcohol abuse Neg Hx   . Arthritis Neg Hx   . Asthma Neg Hx   . Birth defects Neg Hx   . Cancer Neg Hx   . COPD Neg Hx   . Depression Neg Hx   . Diabetes Neg Hx   . Drug abuse Neg Hx   . Early death Neg Hx   . Hearing loss Neg Hx   . Heart disease Neg Hx   . Hyperlipidemia Neg Hx   . Hypertension Neg Hx   . Kidney disease Neg Hx   . Learning disabilities Neg Hx   . Mental illness Neg Hx   . Mental retardation Neg Hx   . Miscarriages / Stillbirths Neg Hx   . Stroke Neg Hx   . Vision loss Neg Hx   . Varicose Veins Neg Hx     Social History   Tobacco Use  . Smoking status: Current Every Day Smoker    Packs/day: 0.25    Years: 7.00    Pack years: 1.75    Types: Cigarettes  . Smokeless tobacco: Never Used  . Tobacco comment: refused  Substance Use Topics  . Alcohol use: Not Currently    Alcohol/week: 12.0 - 18.0 standard drinks    Types: 12 - 18 Cans of beer per week     Comment: daily  . Drug use: Not Currently    Types: Cocaine, Marijuana    Allergies: No Known Allergies  No medications prior to admission.    Review of Systems  Constitutional: Negative for fever.  Gastrointestinal: Positive for abdominal pain. Negative for constipation, diarrhea, nausea and vomiting.  Genitourinary: Negative for dysuria, frequency, urgency, vaginal bleeding and vaginal discharge.   Physical Exam   Blood pressure (!) 107/59, pulse 76, temperature 98.3 F (36.8 C), temperature source Oral, resp. rate 18, height 5\' 4"  (1.626 m), weight 81.8 kg, last menstrual period 04/10/2018, SpO2 100 %.  Physical Exam  Nursing note and vitals reviewed. Constitutional: She is oriented to person, place, and time. She appears well-developed and well-nourished. No  distress.  HENT:  Head: Normocephalic and atraumatic.  Cardiovascular: Normal rate.  Respiratory: Effort normal.  GI: Soft. She exhibits no distension and no mass. There is no abdominal tenderness. There is no rebound and no guarding.  Neurological: She is alert and oriented to person, place, and time.  Skin: Skin is warm and dry. No erythema.  Psychiatric: She has a normal mood and affect.  Dilation: Closed Effacement (%): Thick Exam by:: Vonzella Nipple PA    Results for orders placed or performed during the hospital encounter of 10/25/18 (from the past 24 hour(s))  Urinalysis, Routine w reflex microscopic     Status: Abnormal   Collection Time: 10/25/18  6:44 PM  Result Value Ref Range   Color, Urine YELLOW YELLOW   APPearance CLOUDY (A) CLEAR   Specific Gravity, Urine 1.017 1.005 - 1.030   pH 7.0 5.0 - 8.0   Glucose, UA NEGATIVE NEGATIVE mg/dL   Hgb urine dipstick NEGATIVE NEGATIVE   Bilirubin Urine NEGATIVE NEGATIVE   Ketones, ur NEGATIVE NEGATIVE mg/dL   Protein, ur NEGATIVE NEGATIVE mg/dL   Nitrite NEGATIVE NEGATIVE   Leukocytes,Ua NEGATIVE NEGATIVE    Fetal Monitoring: Baseline: 140  bpm Variability: moderate Accelerations: 15 x 15 Decelerations: 2 small variables noted Contractions: none    MAU Course  Procedures None  MDM UA today  No contractions noted on TOCO. Cervix is closed, thick, firm   Assessment and Plan  A: [facility]UP at [redacted]w[redacted]d Braxton hicks contractions   P:  Discharge home Preterm labor precautions discussed Patient advised to follow-up with [facility] as scheduled or sooner PRN  Patient may return to MAU as needed or if her condition were to change or worsen  Vonzella Nipple, PA-C 10/25/2018, 8:12 PM

## 2018-10-25 NOTE — Progress Notes (Signed)
Baby very active 

## 2018-10-25 NOTE — Progress Notes (Signed)
Kerry Hough PA in to discuss plan of care and d/c plan. Written and verbal d/c instructions given and understanding voiced.

## 2018-10-25 NOTE — MAU Note (Signed)
Around 0930 started having these little tiny pains.  Didn't think much of it.  Now they are regular and stronger, more like cramping. No bleeding or water leaking.   No hx of PTL with other preg.

## 2018-10-25 NOTE — MAU Note (Signed)
Unable to void at this time.

## 2018-10-28 ENCOUNTER — Other Ambulatory Visit: Payer: Medicaid Other

## 2018-10-28 ENCOUNTER — Encounter: Payer: Medicaid Other | Admitting: Obstetrics and Gynecology

## 2018-10-28 ENCOUNTER — Telehealth: Payer: Self-pay | Admitting: Emergency Medicine

## 2018-10-28 NOTE — Telephone Encounter (Signed)
Pt called in to the front office requesting to speak with someone.  Pt complains of having nausea with two episodes of vomiting and asked if this was normal in her stage in pregnancy. Pt was informed that nausea could occur at any time during pregnancy. Pt reports having nausea medication that she "weaned herself off of". Pt was encouraged to take some of her prescribed nausea medication to see if that provides relief. Pt was also encouraged to maintain her fluid intake and if there was no improvement with symptoms or if they worsened to be seen in the hospital for further evaluation and treatment. Pt verbalized understanding and had no further questions or concerns.

## 2018-11-04 ENCOUNTER — Encounter: Payer: Self-pay | Admitting: Obstetrics and Gynecology

## 2018-11-04 ENCOUNTER — Other Ambulatory Visit: Payer: Self-pay

## 2018-11-04 ENCOUNTER — Telehealth: Payer: Self-pay | Admitting: Obstetrics and Gynecology

## 2018-11-04 ENCOUNTER — Ambulatory Visit (INDEPENDENT_AMBULATORY_CARE_PROVIDER_SITE_OTHER): Payer: Medicaid Other | Admitting: Obstetrics and Gynecology

## 2018-11-04 VITALS — BP 128/76 | HR 105 | Wt 180.0 lb

## 2018-11-04 DIAGNOSIS — Z23 Encounter for immunization: Secondary | ICD-10-CM | POA: Diagnosis not present

## 2018-11-04 DIAGNOSIS — Z3A29 29 weeks gestation of pregnancy: Secondary | ICD-10-CM

## 2018-11-04 DIAGNOSIS — F1911 Other psychoactive substance abuse, in remission: Secondary | ICD-10-CM

## 2018-11-04 DIAGNOSIS — O0993 Supervision of high risk pregnancy, unspecified, third trimester: Secondary | ICD-10-CM

## 2018-11-04 DIAGNOSIS — Z3009 Encounter for other general counseling and advice on contraception: Secondary | ICD-10-CM

## 2018-11-04 DIAGNOSIS — F332 Major depressive disorder, recurrent severe without psychotic features: Secondary | ICD-10-CM

## 2018-11-04 DIAGNOSIS — O0990 Supervision of high risk pregnancy, unspecified, unspecified trimester: Secondary | ICD-10-CM

## 2018-11-04 NOTE — Patient Instructions (Signed)
Third Trimester of Pregnancy The third trimester is from week 28 through week 40 (months 7 through 9). The third trimester is a time when the unborn baby (fetus) is growing rapidly. At the end of the ninth month, the fetus is about 20 inches in length and weighs 6-10 pounds. Body changes during your third trimester Your body will continue to go through many changes during pregnancy. The changes vary from woman to woman. During the third trimester:  Your weight will continue to increase. You can expect to gain 25-35 pounds (11-16 kg) by the end of the pregnancy.  You may begin to get stretch marks on your hips, abdomen, and breasts.  You may urinate more often because the fetus is moving lower into your pelvis and pressing on your bladder.  You may develop or continue to have heartburn. This is caused by increased hormones that slow down muscles in the digestive tract.  You may develop or continue to have constipation because increased hormones slow digestion and cause the muscles that push waste through your intestines to relax.  You may develop hemorrhoids. These are swollen veins (varicose veins) in the rectum that can itch or be painful.  You may develop swollen, bulging veins (varicose veins) in your legs.  You may have increased body aches in the pelvis, back, or thighs. This is due to weight gain and increased hormones that are relaxing your joints.  You may have changes in your hair. These can include thickening of your hair, rapid growth, and changes in texture. Some women also have hair loss during or after pregnancy, or hair that feels dry or thin. Your hair will most likely return to normal after your baby is born.  Your breasts will continue to grow and they will continue to become tender. A yellow fluid (colostrum) may leak from your breasts. This is the first milk you are producing for your baby.  Your belly button may stick out.  You may notice more swelling in your hands,  face, or ankles.  You may have increased tingling or numbness in your hands, arms, and legs. The skin on your belly may also feel numb.  You may feel short of breath because of your expanding uterus.  You may have more problems sleeping. This can be caused by the size of your belly, increased need to urinate, and an increase in your body's metabolism.  You may notice the fetus "dropping," or moving lower in your abdomen (lightening).  You may have increased vaginal discharge.  You may notice your joints feel loose and you may have pain around your pelvic bone. What to expect at prenatal visits You will have prenatal exams every 2 weeks until week 36. Then you will have weekly prenatal exams. During a routine prenatal visit:  You will be weighed to make sure you and the baby are growing normally.  Your blood pressure will be taken.  Your abdomen will be measured to track your baby's growth.  The fetal heartbeat will be listened to.  Any test results from the previous visit will be discussed.  You may have a cervical check near your due date to see if your cervix has softened or thinned (effaced).  You will be tested for Group B streptococcus. This happens between 35 and 37 weeks. Your health care provider may ask you:  What your birth plan is.  How you are feeling.  If you are feeling the baby move.  If you have had any abnormal   symptoms, such as leaking fluid, bleeding, severe headaches, or abdominal cramping.  If you are using any tobacco products, including cigarettes, chewing tobacco, and electronic cigarettes.  If you have any questions. Other tests or screenings that may be performed during your third trimester include:  Blood tests that check for low iron levels (anemia).  Fetal testing to check the health, activity level, and growth of the fetus. Testing is done if you have certain medical conditions or if there are problems during the pregnancy.  Nonstress test  (NST). This test checks the health of your baby to make sure there are no signs of problems, such as the baby not getting enough oxygen. During this test, a belt is placed around your belly. The baby is made to move, and its heart rate is monitored during movement. What is false labor? False labor is a condition in which you feel small, irregular tightenings of the muscles in the womb (contractions) that usually go away with rest, changing position, or drinking water. These are called Braxton Hicks contractions. Contractions may last for hours, days, or even weeks before true labor sets in. If contractions come at regular intervals, become more frequent, increase in intensity, or become painful, you should see your health care provider. What are the signs of labor?  Abdominal cramps.  Regular contractions that start at 10 minutes apart and become stronger and more frequent with time.  Contractions that start on the top of the uterus and spread down to the lower abdomen and back.  Increased pelvic pressure and dull back pain.  A watery or bloody mucus discharge that comes from the vagina.  Leaking of amniotic fluid. This is also known as your "water breaking." It could be a slow trickle or a gush. Let your health care provider know if it has a color or strange odor. If you have any of these signs, call your health care provider right away, even if it is before your due date. Follow these instructions at home: Medicines  Follow your health care provider's instructions regarding medicine use. Specific medicines may be either safe or unsafe to take during pregnancy.  Take a prenatal vitamin that contains at least 600 micrograms (mcg) of folic acid.  If you develop constipation, try taking a stool softener if your health care provider approves. Eating and drinking   Eat a balanced diet that includes fresh fruits and vegetables, whole grains, good sources of protein such as meat, eggs, or tofu,  and low-fat dairy. Your health care provider will help you determine the amount of weight gain that is right for you.  Avoid raw meat and uncooked cheese. These carry germs that can cause birth defects in the baby.  If you have low calcium intake from food, talk to your health care provider about whether you should take a daily calcium supplement.  Eat four or five small meals rather than three large meals a day.  Limit foods that are high in fat and processed sugars, such as fried and sweet foods.  To prevent constipation: ? Drink enough fluid to keep your urine clear or pale yellow. ? Eat foods that are high in fiber, such as fresh fruits and vegetables, whole grains, and beans. Activity  Exercise only as directed by your health care provider. Most women can continue their usual exercise routine during pregnancy. Try to exercise for 30 minutes at least 5 days a week. Stop exercising if you experience uterine contractions.  Avoid heavy lifting.  Do   not exercise in extreme heat or humidity, or at high altitudes.  Wear low-heel, comfortable shoes.  Practice good posture.  You may continue to have sex unless your health care provider tells you otherwise. Relieving pain and discomfort  Take frequent breaks and rest with your legs elevated if you have leg cramps or low back pain.  Take warm sitz baths to soothe any pain or discomfort caused by hemorrhoids. Use hemorrhoid cream if your health care provider approves.  Wear a good support bra to prevent discomfort from breast tenderness.  If you develop varicose veins: ? Wear support pantyhose or compression stockings as told by your healthcare provider. ? Elevate your feet for 15 minutes, 3-4 times a day. Prenatal care  Write down your questions. Take them to your prenatal visits.  Keep all your prenatal visits as told by your health care provider. This is important. Safety  Wear your seat belt at all times when driving.  Make  a list of emergency phone numbers, including numbers for family, friends, the hospital, and police and fire departments. General instructions  Avoid cat litter boxes and soil used by cats. These carry germs that can cause birth defects in the baby. If you have a cat, ask someone to clean the litter box for you.  Do not travel far distances unless it is absolutely necessary and only with the approval of your health care provider.  Do not use hot tubs, steam rooms, or saunas.  Do not drink alcohol.  Do not use any products that contain nicotine or tobacco, such as cigarettes and e-cigarettes. If you need help quitting, ask your health care provider.  Do not use any medicinal herbs or unprescribed drugs. These chemicals affect the formation and growth of the baby.  Do not douche or use tampons or scented sanitary pads.  Do not cross your legs for long periods of time.  To prepare for the arrival of your baby: ? Take prenatal classes to understand, practice, and ask questions about labor and delivery. ? Make a trial run to the hospital. ? Visit the hospital and tour the maternity area. ? Arrange for maternity or paternity leave through employers. ? Arrange for family and friends to take care of pets while you are in the hospital. ? Purchase a rear-facing car seat and make sure you know how to install it in your car. ? Pack your hospital bag. ? Prepare the baby's nursery. Make sure to remove all pillows and stuffed animals from the baby's crib to prevent suffocation.  Visit your dentist if you have not gone during your pregnancy. Use a soft toothbrush to brush your teeth and be gentle when you floss. Contact a health care provider if:  You are unsure if you are in labor or if your water has broken.  You become dizzy.  You have mild pelvic cramps, pelvic pressure, or nagging pain in your abdominal area.  You have lower back pain.  You have persistent nausea, vomiting, or diarrhea.   You have an unusual or bad smelling vaginal discharge.  You have pain when you urinate. Get help right away if:  Your water breaks before 37 weeks.  You have regular contractions less than 5 minutes apart before 37 weeks.  You have a fever.  You are leaking fluid from your vagina.  You have spotting or bleeding from your vagina.  You have severe abdominal pain or cramping.  You have rapid weight loss or weight gain.  You have   shortness of breath with chest pain.  You notice sudden or extreme swelling of your face, hands, ankles, feet, or legs.  Your baby makes fewer than 10 movements in 2 hours.  You have severe headaches that do not go away when you take medicine.  You have vision changes. Summary  The third trimester is from week 28 through week 40, months 7 through 9. The third trimester is a time when the unborn baby (fetus) is growing rapidly.  During the third trimester, your discomfort may increase as you and your baby continue to gain weight. You may have abdominal, leg, and back pain, sleeping problems, and an increased need to urinate.  During the third trimester your breasts will keep growing and they will continue to become tender. A yellow fluid (colostrum) may leak from your breasts. This is the first milk you are producing for your baby.  False labor is a condition in which you feel small, irregular tightenings of the muscles in the womb (contractions) that eventually go away. These are called Braxton Hicks contractions. Contractions may last for hours, days, or even weeks before true labor sets in.  Signs of labor can include: abdominal cramps; regular contractions that start at 10 minutes apart and become stronger and more frequent with time; watery or bloody mucus discharge that comes from the vagina; increased pelvic pressure and dull back pain; and leaking of amniotic fluid. This information is not intended to replace advice given to you by your health  care provider. Make sure you discuss any questions you have with your health care provider. Document Released: 12/20/2000 Document Revised: 04/18/2018 Document Reviewed: 02/01/2016 Elsevier Patient Education  2020 [employer] Inc[employer]  

## 2018-11-04 NOTE — Telephone Encounter (Signed)
Spoke with patient to get her scheduled for her next ob appointment. Patient stated we need to make it quick because her phone is able to die. Patient instructed that I can schedule her appointments and they will be available via mychart. Patient stated that they have to be after 12pm. Patient instructed that I cannot schedule her 2hr appointment that late because she does have to come fasting. Patient stated " then I guess I will not be doing because I cannot come any sooner because I can not miss work". Patient was not scheduled for 2hr and [facility] visit was scheduled.

## 2018-11-05 NOTE — Progress Notes (Signed)
Subjective:  Nancy Henson is a 23 y.o. G4P3003 at [redacted]w[redacted]d being seen today for ongoing prenatal care.  She is currently monitored for the following issues for this low-risk pregnancy and has Migraine headache without aura; Major depressive disorder, recurrent severe without psychotic features ([place]); Cocaine abuse ([place]); Supervision of high risk pregnancy, antepartum; History of substance abuse ([place]); and Unwanted fertility on their problem list.  Patient reports no complaints.  Contractions: Not present. Vag. Bleeding: None.  Movement: Present. Denies leaking of fluid.   The following portions of the patient's history were reviewed and updated as appropriate: allergies, current medications, past family history, past medical history, past social history, past surgical history and problem list. Problem list updated.  Objective:   Vitals:   11/04/18 1621  BP: 128/76  Pulse: (!) 105  Weight: 180 lb (81.6 kg)    Fetal Status: Fetal Heart Rate (bpm): 159   Movement: Present     General:  Alert, oriented and cooperative. Patient is in no acute distress.  Skin: Skin is warm and dry. No rash noted.   Cardiovascular: Normal heart rate noted  Respiratory: Normal respiratory effort, no problems with respiration noted  Abdomen: Soft, gravid, appropriate for gestational age. Pain/Pressure: Present     Pelvic:  Cervical exam deferred        Extremities: Normal range of motion.  Edema: None  Mental Status: Normal mood and affect. Normal behavior. Normal judgment and thought content.   Urinalysis:      Assessment and Plan:  Pregnancy: G4P3003 at [redacted]w[redacted]d  1. Supervision of high risk pregnancy, antepartum Stable To schedule Glucola ASAP - Tdap vaccine greater than or equal to 7yo IM  2. Major depressive disorder, recurrent severe without psychotic features ([place]) Stable on Zoloft  3. History of substance abuse ([place]) Stable  4. Unwanted fertility BTL papers signed today  Preterm labor  symptoms and general obstetric precautions including but not limited to vaginal bleeding, contractions, leaking of fluid and fetal movement were reviewed in detail with the patient. Please refer to After Visit Summary for other counseling recommendations.  Return in about 2 weeks (around 11/18/2018) for OB visit, virtual.   Chancy Milroy, MD

## 2018-11-07 ENCOUNTER — Other Ambulatory Visit: Payer: Self-pay | Admitting: Lactation Services

## 2018-11-07 ENCOUNTER — Other Ambulatory Visit: Payer: Self-pay

## 2018-11-07 DIAGNOSIS — O0990 Supervision of high risk pregnancy, unspecified, unspecified trimester: Secondary | ICD-10-CM

## 2018-11-11 ENCOUNTER — Other Ambulatory Visit: Payer: Self-pay

## 2018-11-11 ENCOUNTER — Other Ambulatory Visit: Payer: Medicaid Other

## 2018-11-11 ENCOUNTER — Encounter: Payer: Self-pay | Admitting: Emergency Medicine

## 2018-11-11 ENCOUNTER — Other Ambulatory Visit: Payer: Self-pay | Admitting: Emergency Medicine

## 2018-11-11 DIAGNOSIS — O0990 Supervision of high risk pregnancy, unspecified, unspecified trimester: Secondary | ICD-10-CM

## 2018-11-11 MED ORDER — CYCLOBENZAPRINE HCL 5 MG PO TABS: 5.0000 mg | ORAL_TABLET | Freq: Three times a day (TID) | ORAL | Status: DC | PRN

## 2018-11-11 NOTE — Progress Notes (Signed)
Pt here today for 2 hour glucose lab. Pt complained of back spasms upon arrival and requested to speak with someone. Per Junie Panning, NP orders were placed for flexeril 5 mg and pt instructed to follow up at office visit on 11/9.

## 2018-11-12 ENCOUNTER — Telehealth: Payer: Self-pay | Admitting: Obstetrics and Gynecology

## 2018-11-12 ENCOUNTER — Encounter: Payer: Self-pay | Admitting: Obstetrics and Gynecology

## 2018-11-12 LAB — CBC
Hematocrit: 34.5 % (ref 34.0–46.6)
Hemoglobin: 12.2 g/dL (ref 11.1–15.9)
MCH: 31.6 pg (ref 26.6–33.0)
MCHC: 35.4 g/dL (ref 31.5–35.7)
MCV: 89 fL (ref 79–97)
Platelets: 242 10*3/uL (ref 150–450)
RBC: 3.86 x10E6/uL (ref 3.77–5.28)
RDW: 12.2 % (ref 11.7–15.4)
WBC: 8.2 10*3/uL (ref 3.4–10.8)

## 2018-11-12 LAB — GLUCOSE TOLERANCE, 2 HOURS W/ 1HR
Glucose, 1 hour: 160 mg/dL (ref 65–179)
Glucose, 2 hour: 106 mg/dL (ref 65–152)
Glucose, Fasting: 83 mg/dL (ref 65–91)

## 2018-11-12 LAB — RPR: RPR Ser Ql: NONREACTIVE

## 2018-11-12 LAB — HIV ANTIBODY (ROUTINE TESTING W REFLEX): HIV Screen 4th Generation wRfx: NONREACTIVE

## 2018-11-12 NOTE — Telephone Encounter (Signed)
The patient called in stating she forgot to get her work note at our office yesterday. Informed the patient I will post it in mychart for her so that she does not have to come back to the office or she can pick it up which ever is more convienant. The patient stated she would also like an morning appointment rather than afternoon. Informed the patient of the visit being a mychart visit. She stated it was changed to face to face. 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 Placed the patient on hold while looking for an morning appointment and writing the doctor's note. Once the hold was lifted I called the patient by first name (no answer). I said hello three times (no answer). The call was disconnected. A letter is being mailed. Will make an attempt to call the patient back.

## 2018-11-12 NOTE — Telephone Encounter (Signed)
The patient called back before a call could be returned. The appointment was rescheduled for an morning and also re-advised the patient of the appointment being an mychart visit.

## 2018-11-18 ENCOUNTER — Encounter: Payer: Medicaid Other | Admitting: Family Medicine

## 2018-11-21 ENCOUNTER — Other Ambulatory Visit: Payer: Self-pay

## 2018-11-21 ENCOUNTER — Telehealth (INDEPENDENT_AMBULATORY_CARE_PROVIDER_SITE_OTHER): Payer: Medicaid Other | Admitting: Family Medicine

## 2018-11-21 DIAGNOSIS — F1911 Other psychoactive substance abuse, in remission: Secondary | ICD-10-CM

## 2018-11-21 DIAGNOSIS — Z3A32 32 weeks gestation of pregnancy: Secondary | ICD-10-CM

## 2018-11-21 DIAGNOSIS — O0990 Supervision of high risk pregnancy, unspecified, unspecified trimester: Secondary | ICD-10-CM

## 2018-11-21 DIAGNOSIS — O0993 Supervision of high risk pregnancy, unspecified, third trimester: Secondary | ICD-10-CM

## 2018-11-21 NOTE — Progress Notes (Signed)
   TELEHEALTH OBSTETRICS PRENATAL VIRTUAL VIDEO VISIT ENCOUNTER NOTE  Provider location: Center for [employer] [facility] at [place]   I connected with Nancy Henson on 11/21/18 at  3:55 PM EST by MyChart Video Encounter at home and verified that I am speaking with the correct person using two identifiers.   I discussed the limitations, risks, security and privacy concerns of performing an evaluation and management service virtually and the availability of in person appointments. I also discussed with the patient that there may be a patient responsible charge related to this service. The patient expressed understanding and agreed to proceed. Subjective:  Nancy Henson is a 23 y.o. G4P3003 at [redacted]w[redacted]d being seen today for ongoing prenatal care.  She is currently monitored for the following issues for this high-risk pregnancy and has Migraine headache without aura; Major depressive disorder, recurrent severe without psychotic features ([place]); Cocaine abuse ([place]); Supervision of high risk pregnancy, antepartum; History of substance abuse ([place]); and Unwanted fertility on their problem list.  Patient reports no complaints.  Contractions: Not present. Vag. Bleeding: None.  Movement: Present. Denies any leaking of fluid.   The following portions of the patient's history were reviewed and updated as appropriate: allergies, current medications, past family history, past medical history, past social history, past surgical history and problem list.   Objective:  There were no vitals filed for this visit.  Fetal Status:     Movement: Present     General:  Alert, oriented and cooperative. Patient is in no acute distress.  Respiratory: Normal respiratory effort, no problems with respiration noted  Mental Status: Normal mood and affect. Normal behavior. Normal judgment and thought content.  Rest of physical exam deferred due to type of encounter  Imaging: No results found.  Assessment and Plan:   Pregnancy: G4P3003 at [redacted]w[redacted]d 1. Supervision of high risk pregnancy, antepartum Reports some decreased FM though improves with eating or drinking something.  2. History of substance abuse ([place])   Preterm labor symptoms and general obstetric precautions including but not limited to vaginal bleeding, contractions, leaking of fluid and fetal movement were reviewed in detail with the patient. I discussed the assessment and treatment plan with the patient. The patient was provided an opportunity to ask questions and all were answered. The patient agreed with the plan and demonstrated an understanding of the instructions. The patient was advised to call back or seek an in-person office evaluation/go to [facility]U at [facility] for any urgent or concerning symptoms. Please refer to After Visit Summary for other counseling recommendations.   I provided 12 minutes of face-to-face time during this encounter.  Return in 2 weeks (on 12/05/2018) for in person.  Future Appointments  Date Time Provider [place]  12/12/2018  4:15 PM Chancy Milroy, MD [place], [place] for [facility], [place]

## 2018-11-21 NOTE — Progress Notes (Signed)
Attempted to contact pt at 1600; VM left stating I was calling to check pt in for virtual appt and will call back in 15 minutes.   I connected with  Nancy Henson on 11/21/18 at  3:55 PM EST by telephone and verified that I am speaking with the correct person using two identifiers.  I discussed the limitations, risks, security and privacy concerns of performing an evaluation and management service by telephone and the availability of in person appointments. I also discussed with the patient that there may be a patient responsible charge related to this service. The patient expressed understanding and agreed to proceed.  Annabell Howells, RN 11/21/2018  4:17 PM

## 2018-11-21 NOTE — Patient Instructions (Signed)

## 2018-12-12 ENCOUNTER — Other Ambulatory Visit: Payer: Self-pay

## 2018-12-12 ENCOUNTER — Ambulatory Visit (INDEPENDENT_AMBULATORY_CARE_PROVIDER_SITE_OTHER): Payer: Medicaid Other | Admitting: Obstetrics and Gynecology

## 2018-12-12 VITALS — BP 118/79 | HR 106 | Wt 177.1 lb

## 2018-12-12 DIAGNOSIS — Z3A35 35 weeks gestation of pregnancy: Secondary | ICD-10-CM

## 2018-12-12 DIAGNOSIS — O0993 Supervision of high risk pregnancy, unspecified, third trimester: Secondary | ICD-10-CM

## 2018-12-12 DIAGNOSIS — O0990 Supervision of high risk pregnancy, unspecified, unspecified trimester: Secondary | ICD-10-CM

## 2018-12-12 DIAGNOSIS — K5900 Constipation, unspecified: Secondary | ICD-10-CM

## 2018-12-12 MED ORDER — WITCH HAZEL-GLYCERIN EX PADS: 1.0000 "application " | MEDICATED_PAD | CUTANEOUS | 12 refills | Status: DC | PRN

## 2018-12-12 MED ORDER — DOCUSATE SODIUM 100 MG PO CAPS: 100.0000 mg | ORAL_CAPSULE | Freq: Two times a day (BID) | ORAL | 0 refills | Status: DC

## 2018-12-12 NOTE — Progress Notes (Signed)
   PRENATAL VISIT NOTE  Subjective:  Nancy Henson is a 23 y.o. G4P3003 at [redacted]w[redacted]d being seen today for ongoing prenatal care.  She is currently monitored for the following issues for this high-risk pregnancy and has Migraine headache without aura; Major depressive disorder, recurrent severe without psychotic features ([place]); Cocaine abuse ([place]); Supervision of high risk pregnancy, antepartum; History of substance abuse ([place]); and Unwanted fertility on their problem list.  Patient reports Constipation and hemorrhoids.  Contractions: Not present. Vag. Bleeding: None.  Movement: Present. Denies leaking of fluid.   The following portions of the patient's history were reviewed and updated as appropriate: allergies, current medications, past family history, past medical history, past social history, past surgical history and problem list.   Objective:   Vitals:   12/12/18 1623  BP: 118/79  Pulse: (!) 106  Weight: 177 lb 1.6 oz (80.3 kg)    Fetal Status: Fetal Heart Rate (bpm): 150 Fundal Height: 35 cm Movement: Present     General:  Alert, oriented and cooperative. Patient is in no acute distress.  Skin: Skin is warm and dry. No rash noted.   Cardiovascular: Normal heart rate noted  Respiratory: Normal respiratory effort, no problems with respiration noted  Abdomen: Soft, gravid, appropriate for gestational age.  Pain/Pressure: Absent     Pelvic: Cervical exam deferred        Extremities: Normal range of motion.  Edema: None  Mental Status: Normal mood and affect. Normal behavior. Normal judgment and thought content.   Assessment and Plan:  Pregnancy: G4P3003 at [redacted]w[redacted]d  1. Supervision of high risk pregnancy, antepartum  Doing well GBS and cultures next week.    2. Constipation, unspecified constipation type  Rx: Colace BID, Tucks pads. Increase calories and fiber.   Preterm labor symptoms and general obstetric precautions including but not limited to vaginal bleeding,  contractions, leaking of fluid and fetal movement were reviewed in detail with the patient. Please refer to After Visit Summary for other counseling recommendations.   No follow-ups on file.  Future Appointments  Date Time Provider [place]  12/19/2018 11:15 AM Sueellen Kayes, Artist Pais, NP [facility]    Noni Saupe, NP

## 2018-12-13 ENCOUNTER — Inpatient Hospital Stay (HOSPITAL_COMMUNITY)
Admission: AD | Admit: 2018-12-13 | Discharge: 2018-12-13 | Disposition: A | Discharge status: Home / Self Care | Payer: Medicaid Other | Attending: Obstetrics & Gynecology | Admitting: Obstetrics & Gynecology

## 2018-12-13 ENCOUNTER — Encounter (HOSPITAL_COMMUNITY): Payer: Self-pay

## 2018-12-13 ENCOUNTER — Telehealth: Payer: Self-pay | Admitting: Obstetrics & Gynecology

## 2018-12-13 DIAGNOSIS — Z0371 Encounter for suspected problem with amniotic cavity and membrane ruled out: Secondary | ICD-10-CM

## 2018-12-13 DIAGNOSIS — N899 Noninflammatory disorder of vagina, unspecified: Secondary | ICD-10-CM | POA: Insufficient documentation

## 2018-12-13 DIAGNOSIS — Z3A35 35 weeks gestation of pregnancy: Secondary | ICD-10-CM

## 2018-12-13 DIAGNOSIS — O26893 Other specified pregnancy related conditions, third trimester: Secondary | ICD-10-CM | POA: Insufficient documentation

## 2018-12-13 DIAGNOSIS — O479 False labor, unspecified: Secondary | ICD-10-CM

## 2018-12-13 DIAGNOSIS — O4703 False labor before 37 completed weeks of gestation, third trimester: Secondary | ICD-10-CM | POA: Diagnosis not present

## 2018-12-13 LAB — URINALYSIS, ROUTINE W REFLEX MICROSCOPIC
Bilirubin Urine: NEGATIVE
Glucose, UA: NEGATIVE mg/dL
Hgb urine dipstick: NEGATIVE
Ketones, ur: 20 mg/dL — AB
Leukocytes,Ua: NEGATIVE
Nitrite: NEGATIVE
Protein, ur: NEGATIVE mg/dL
Specific Gravity, Urine: 1.004 — ABNORMAL LOW (ref 1.005–1.030)
pH: 8 (ref 5.0–8.0)

## 2018-12-13 NOTE — MAU Provider Note (Signed)
Chief Complaint  Patient presents with  . leaking fluid  . lost mucus plug     First Provider Initiated Contact with Patient 12/13/18 1400      S: Nancy Henson  is a 23 y.o. y.o. year old G9P3003 female at [redacted]w[redacted]d weeks gestation who presents to MAU reporting leaking of clear fluid 1 week ago and passage of thin, blood-tinged mucus today.   Contractions: Irreg, mild Vaginal bleeding: Scant  Fetal movement: Nml  O:  Patient Vitals for the past 24 hrs:  BP Temp Temp src Pulse Resp SpO2 Height Weight  12/13/18 1503 117/60 - - 67 - - - -  12/13/18 1326 108/65 98.4 F (36.9 C) Oral 75 16 100 % 5\' 4"  (1.626 m) 80.5 kg   General: NAD Heart: Regular rate Lungs: Normal rate and effort Abd: Soft, NT, Gravid, S=D Pelvic: NEFG, Neg pooling, no blood. Scant white mucus in vault Dilation: Closed/thick Exam by:: V.Jaziel Bennett, CNM  EFM: 150, Moderate variability, 15 x 15 accelerations, no decelerations Toco: rare, mild  Neg Fern  A: [redacted]w[redacted]d week IUP No evidence of SROM or labor FHR reactive  P: Discharge home in stable condition. Preterm Labor precautions and fetal kick counts. Follow-up as scheduled for prenatal visit or sooner as needed if symptoms worsen. Return to maternity admissions as needed if symptoms worsen.  Tamala Julian, [place], [place] 12/13/2018 3:27 PM  2

## 2018-12-13 NOTE — Discharge Instructions (Signed)
Braxton Hicks Contractions Contractions of the uterus can occur throughout pregnancy, but they are not always a sign that you are in labor. You may have practice contractions called Braxton Hicks contractions. These false labor contractions are sometimes confused with true labor. What are Braxton Hicks contractions? Braxton Hicks contractions are tightening movements that occur in the muscles of the uterus before labor. Unlike true labor contractions, these contractions do not result in opening (dilation) and thinning of the cervix. Toward the end of pregnancy (32-34 weeks), Braxton Hicks contractions can happen more often and may become stronger. These contractions are sometimes difficult to tell apart from true labor because they can be very uncomfortable. You should not feel embarrassed if you go to the hospital with false labor. Sometimes, the only way to tell if you are in true labor is for your health care provider to look for changes in the cervix. The health care provider will do a physical exam and may monitor your contractions. If you are not in true labor, the exam should show that your cervix is not dilating and your water has not broken. If there are no other health problems associated with your pregnancy, it is completely safe for you to be sent home with false labor. You may continue to have Braxton Hicks contractions until you go into true labor. How to tell the difference between true labor and false labor True labor  Contractions last 30-70 seconds.  Contractions become very regular.  Discomfort is usually felt in the top of the uterus, and it spreads to the lower abdomen and low back.  Contractions do not go away with walking.  Contractions usually become more intense and increase in frequency.  The cervix dilates and gets thinner. False labor  Contractions are usually shorter and not as strong as true labor contractions.  Contractions are usually irregular.  Contractions  are often felt in the front of the lower abdomen and in the groin.  Contractions may go away when you walk around or change positions while lying down.  Contractions get weaker and are shorter-lasting as time goes on.  The cervix usually does not dilate or become thin. Follow these instructions at home:   Take over-the-counter and prescription medicines only as told by your health care provider.  Keep up with your usual exercises and follow other instructions from your health care provider.  Eat and drink lightly if you think you are going into labor.  If Braxton Hicks contractions are making you uncomfortable: ? Change your position from lying down or resting to walking, or change from walking to resting. ? Sit and rest in a tub of warm water. ? Drink enough fluid to keep your urine pale yellow. Dehydration may cause these contractions. ? Do slow and deep breathing several times an hour.  Keep all follow-up prenatal visits as told by your health care provider. This is important. Contact a health care provider if:  You have a fever.  You have continuous pain in your abdomen. Get help right away if:  Your contractions become stronger, more regular, and closer together.  You have fluid leaking or gushing from your vagina.  You pass blood-tinged mucus (bloody show).  You have bleeding from your vagina.  You have low back pain that you never had before.  You feel your baby's head pushing down and causing pelvic pressure.  Your baby is not moving inside you as much as it used to. Summary  Contractions that occur before labor are   called Braxton Hicks contractions, false labor, or practice contractions.  Braxton Hicks contractions are usually shorter, weaker, farther apart, and less regular than true labor contractions. True labor contractions usually become progressively stronger and regular, and they become more frequent.  Manage discomfort from Braxton Hicks contractions  by changing position, resting in a warm bath, drinking plenty of water, or practicing deep breathing. This information is not intended to replace advice given to you by your health care provider. Make sure you discuss any questions you have with your health care provider. Document Released: 05/11/2016 Document Revised: 12/08/2016 Document Reviewed: 05/11/2016 Elsevier Patient Education  2020 Elsevier Inc.  

## 2018-12-13 NOTE — Telephone Encounter (Signed)
I called Fable back and she reports she had cramping / pain this am and then went to the bathroom and had diarrhea. She reports she had light green snotty looking mixed with blood when she wiped. She states she is sure it was from her vagina , not rectum. She denies any leakage of fluid. She confirms good fetal movement. We discussed she is 35 weeks and although it does not sound like her water has broken- since she thinks the discharge was vaginal I recommend she go to [facility]( because our office is closing in a few minutes)  to get checked and make sure she doesn't have ROM for safety of her baby. She voices understanding. Rilyn Scroggs,RN

## 2018-12-13 NOTE — Telephone Encounter (Signed)
Thinks she lost her mucus plug.  

## 2018-12-19 ENCOUNTER — Other Ambulatory Visit: Payer: Self-pay

## 2018-12-19 ENCOUNTER — Ambulatory Visit (INDEPENDENT_AMBULATORY_CARE_PROVIDER_SITE_OTHER): Payer: Medicaid Other | Admitting: Obstetrics and Gynecology

## 2018-12-19 ENCOUNTER — Other Ambulatory Visit (HOSPITAL_COMMUNITY)
Admission: RE | Admit: 2018-12-19 | Discharge: 2018-12-19 | Disposition: A | Discharge status: Home / Self Care | Payer: Medicaid Other | Source: Ambulatory Visit | Attending: Obstetrics and Gynecology | Admitting: Obstetrics and Gynecology

## 2018-12-19 VITALS — BP 116/77 | HR 110 | Wt 178.0 lb

## 2018-12-19 DIAGNOSIS — O0990 Supervision of high risk pregnancy, unspecified, unspecified trimester: Secondary | ICD-10-CM | POA: Diagnosis present

## 2018-12-19 DIAGNOSIS — O0993 Supervision of high risk pregnancy, unspecified, third trimester: Secondary | ICD-10-CM

## 2018-12-19 DIAGNOSIS — Z3A36 36 weeks gestation of pregnancy: Secondary | ICD-10-CM

## 2018-12-19 NOTE — Progress Notes (Signed)
   PRENATAL VISIT NOTE  Subjective:  Nancy Henson is a 23 y.o. G4P3003 at [redacted]w[redacted]d being seen today for ongoing prenatal care.  She is currently monitored for the following issues for this high-risk pregnancy and has Migraine headache without aura; Major depressive disorder, recurrent severe without psychotic features ([place]); Cocaine abuse ([place]); Supervision of high risk pregnancy, antepartum; History of substance abuse ([place]); and Unwanted fertility on their problem list.  Patient reports no complaints.  Contractions: Not present. Vag. Bleeding: None.  Movement: Present. Denies leaking of fluid.   The following portions of the patient's history were reviewed and updated as appropriate: allergies, current medications, past family history, past medical history, past social history, past surgical history and problem list.   Objective:   Vitals:   12/19/18 1138  BP: 116/77  Pulse: (!) 110  Weight: 178 lb (80.7 kg)    Fetal Status: Fetal Heart Rate (bpm): 159 Fundal Height: 36 cm Movement: Present     General:  Alert, oriented and cooperative. Patient is in no acute distress.  Skin: Skin is warm and dry. No rash noted.   Cardiovascular: Normal heart rate noted  Respiratory: Normal respiratory effort, no problems with respiration noted  Abdomen: Soft, gravid, appropriate for gestational age.  Pain/Pressure: Absent     Pelvic: Cervical exam deferred        Extremities: Normal range of motion.  Edema: None  Mental Status: Normal mood and affect. Normal behavior. Normal judgment and thought content.   Assessment and Plan:  Pregnancy: G4P3003 at [redacted]w[redacted]d 1. Supervision of high risk pregnancy, antepartum - Having occasional diarrhea. No N/V. No fever. States she had this with her last pregnancy. Recommend fiber gummies daily.  - Culture, beta strep (group b only) - Cervicovaginal ancillary only( [place])  Preterm labor symptoms and general obstetric precautions including but not limited  to vaginal bleeding, contractions, leaking of fluid and fetal movement were reviewed in detail with the patient. Please refer to After Visit Summary for other counseling recommendations.   Return in about 1 week (around 12/26/2018) for Virtual is ok .  No future appointments.  Noni Saupe, NP

## 2018-12-20 LAB — CERVICOVAGINAL ANCILLARY ONLY
Chlamydia: NEGATIVE
Comment: NEGATIVE
Comment: NORMAL
Neisseria Gonorrhea: NEGATIVE

## 2018-12-23 LAB — OB RESULTS CONSOLE GBS: GBS: NEGATIVE

## 2018-12-23 LAB — CULTURE, BETA STREP (GROUP B ONLY): Strep Gp B Culture: NEGATIVE

## 2018-12-24 ENCOUNTER — Telehealth (INDEPENDENT_AMBULATORY_CARE_PROVIDER_SITE_OTHER): Payer: Medicaid Other | Admitting: Obstetrics and Gynecology

## 2018-12-24 DIAGNOSIS — Z3A36 36 weeks gestation of pregnancy: Secondary | ICD-10-CM | POA: Diagnosis not present

## 2018-12-24 DIAGNOSIS — O0993 Supervision of high risk pregnancy, unspecified, third trimester: Secondary | ICD-10-CM | POA: Diagnosis not present

## 2018-12-24 DIAGNOSIS — O368130 Decreased fetal movements, third trimester, not applicable or unspecified: Secondary | ICD-10-CM | POA: Diagnosis not present

## 2018-12-24 DIAGNOSIS — O368190 Decreased fetal movements, unspecified trimester, not applicable or unspecified: Secondary | ICD-10-CM

## 2018-12-24 DIAGNOSIS — O0990 Supervision of high risk pregnancy, unspecified, unspecified trimester: Secondary | ICD-10-CM

## 2018-12-24 NOTE — Progress Notes (Signed)
I connected with  Nancy Henson on 12/24/18 at  1:35 PM EST by telephone and verified that I am speaking with the correct person using two identifiers.   I discussed the limitations, risks, security and privacy concerns of performing an evaluation and management service by telephone and the availability of in person appointments. I also discussed with the patient that there may be a patient responsible charge related to this service. The patient expressed understanding and agreed to proceed.  Aviva Signs [name], CMA 12/24/2018  1:33 PM   Doesn't have BP Cuff with her. Mucus plus is a little bloody Advised of kick count for baby to ensure fetal movement she stated she had been doing that pretty much all day.

## 2018-12-24 NOTE — Progress Notes (Signed)
   TELEHEALTH VIRTUAL OBSTETRICS VISIT ENCOUNTER NOTE  I connected with Nancy Henson on 12/24/18 at  1:35 PM EST by telephone at home and verified that I am speaking with the correct person using two identifiers.   I discussed the limitations, risks, security and privacy concerns of performing an evaluation and management service by telephone and the availability of in person appointments. I also discussed with the patient that there may be a patient responsible charge related to this service. The patient expressed understanding and agreed to proceed.  Subjective:  Nancy Henson is a 23 y.o. G4P3003 at [redacted]w[redacted]d being followed for ongoing prenatal care.  She is currently monitored for the following issues for this low-risk pregnancy and has Migraine headache without aura; Major depressive disorder, recurrent severe without psychotic features ([place]); Cocaine abuse ([place]); Supervision of high risk pregnancy, antepartum; History of substance abuse ([place]); Unwanted fertility; and Decreased fetal movements affecting management of mother, antepartum on their problem list.  Patient reports no complaints. Reports fetal movement. Denies any contractions, bleeding or leaking of fluid.   The following portions of the patient's history were reviewed and updated as appropriate: allergies, current medications, past family history, past medical history, past social history, past surgical history and problem list.   Objective:   General:  Alert, oriented and cooperative.   Mental Status: Normal mood and affect perceived. Normal judgment and thought content.  Rest of physical exam deferred due to type of encounter  Assessment and Plan:  Pregnancy: G4P3003 at [redacted]w[redacted]d  1. Supervision of high risk pregnancy, antepartum  Does not have batteries for her BP cuff.  Needs in person visit in 1 week.   2. Decreased fetal movement affecting management of pregnancy, antepartum, single or unspecified fetus  She is  trying candy and soda and will plan to lay on her left side when she gets off the video call. If baby does not start moving Normally she will go to MAU for evaluation.     Preterm labor symptoms and general obstetric precautions including but not limited to vaginal bleeding, contractions, leaking of fluid and fetal movement were reviewed in detail with the patient.  I discussed the assessment and treatment plan with the patient. The patient was provided an opportunity to ask questions and all were answered. The patient agreed with the plan and demonstrated an understanding of the instructions. The patient was advised to call back or seek an in-person office evaluation/go to MAU at [facility] for any urgent or concerning symptoms. Please refer to After Visit Summary for other counseling recommendations.   I provided 11 minutes of non-face-to-face time during this encounter.  No follow-ups on file.  No future appointments.  Noni Saupe, NP [facility] [employer], [place]

## 2018-12-31 ENCOUNTER — Encounter (HOSPITAL_COMMUNITY): Payer: Self-pay | Admitting: Family Medicine

## 2018-12-31 ENCOUNTER — Ambulatory Visit (INDEPENDENT_AMBULATORY_CARE_PROVIDER_SITE_OTHER): Payer: Medicaid Other | Admitting: Obstetrics and Gynecology

## 2018-12-31 ENCOUNTER — Other Ambulatory Visit: Payer: Self-pay

## 2018-12-31 ENCOUNTER — Inpatient Hospital Stay (HOSPITAL_COMMUNITY)
Admission: AD | Admit: 2018-12-31 | Discharge: 2019-01-01 | Disposition: A | Discharge status: Home / Self Care | Payer: Medicaid Other | Source: Ambulatory Visit | Attending: Family Medicine | Admitting: Family Medicine

## 2018-12-31 VITALS — BP 126/86 | HR 102 | Wt 177.0 lb

## 2018-12-31 DIAGNOSIS — O0990 Supervision of high risk pregnancy, unspecified, unspecified trimester: Secondary | ICD-10-CM

## 2018-12-31 DIAGNOSIS — Z3A00 Weeks of gestation of pregnancy not specified: Secondary | ICD-10-CM | POA: Insufficient documentation

## 2018-12-31 DIAGNOSIS — F1911 Other psychoactive substance abuse, in remission: Secondary | ICD-10-CM

## 2018-12-31 DIAGNOSIS — O0993 Supervision of high risk pregnancy, unspecified, third trimester: Secondary | ICD-10-CM

## 2018-12-31 DIAGNOSIS — Z3A37 37 weeks gestation of pregnancy: Secondary | ICD-10-CM

## 2018-12-31 NOTE — MAU Note (Signed)
I have communicated with Rosanna Randy, CNM and reviewed vital signs:  Vitals:   12/31/18 2238 12/31/18 2241  BP:  120/65  Pulse:  98  Resp: 16   Temp: 98.4 F (36.9 C)     Vaginal exam:  Dilation: 1 Effacement (%): Thick Cervical Position: Anterior Station: -2 Presentation: Vertex Exam by:: Blima Singer RNC,   Also reviewed contraction pattern and that non-stress test is reactive. It has been documented that patient is contracting irregularly not indicating active labor.  Patient denies any other complaints.  Based on this report provider has given order for discharge.  A discharge order and diagnosis entered by a provider.   Labor discharge instructions reviewed with patient.

## 2018-12-31 NOTE — Discharge Instructions (Signed)
Braxton Hicks Contractions Contractions of the uterus can occur throughout pregnancy, but they are not always a sign that you are in labor. You may have practice contractions called Braxton Hicks contractions. These false labor contractions are sometimes confused with true labor. What are Braxton Hicks contractions? Braxton Hicks contractions are tightening movements that occur in the muscles of the uterus before labor. Unlike true labor contractions, these contractions do not result in opening (dilation) and thinning of the cervix. Toward the end of pregnancy (32-34 weeks), Braxton Hicks contractions can happen more often and may become stronger. These contractions are sometimes difficult to tell apart from true labor because they can be very uncomfortable. You should not feel embarrassed if you go to the hospital with false labor. Sometimes, the only way to tell if you are in true labor is for your health care provider to look for changes in the cervix. The health care provider will do a physical exam and may monitor your contractions. If you are not in true labor, the exam should show that your cervix is not dilating and your water has not broken. If there are no other health problems associated with your pregnancy, it is completely safe for you to be sent home with false labor. You may continue to have Braxton Hicks contractions until you go into true labor. How to tell the difference between true labor and false labor True labor  Contractions last 30-70 seconds.  Contractions become very regular.  Discomfort is usually felt in the top of the uterus, and it spreads to the lower abdomen and low back.  Contractions do not go away with walking.  Contractions usually become more intense and increase in frequency.  The cervix dilates and gets thinner. False labor  Contractions are usually shorter and not as strong as true labor contractions.  Contractions are usually irregular.  Contractions  are often felt in the front of the lower abdomen and in the groin.  Contractions may go away when you walk around or change positions while lying down.  Contractions get weaker and are shorter-lasting as time goes on.  The cervix usually does not dilate or become thin. Follow these instructions at home:   Take over-the-counter and prescription medicines only as told by your health care provider.  Keep up with your usual exercises and follow other instructions from your health care provider.  Eat and drink lightly if you think you are going into labor.  If Braxton Hicks contractions are making you uncomfortable: ? Change your position from lying down or resting to walking, or change from walking to resting. ? Sit and rest in a tub of warm water. ? Drink enough fluid to keep your urine pale yellow. Dehydration may cause these contractions. ? Do slow and deep breathing several times an hour.  Keep all follow-up prenatal visits as told by your health care provider. This is important. Contact a health care provider if:  You have a fever.  You have continuous pain in your abdomen. Get help right away if:  Your contractions become stronger, more regular, and closer together.  You have fluid leaking or gushing from your vagina.  You pass blood-tinged mucus (bloody show).  You have bleeding from your vagina.  You have low back pain that you never had before.  You feel your baby's head pushing down and causing pelvic pressure.  Your baby is not moving inside you as much as it used to. Summary  Contractions that occur before labor are   called Braxton Hicks contractions, false labor, or practice contractions.  Braxton Hicks contractions are usually shorter, weaker, farther apart, and less regular than true labor contractions. True labor contractions usually become progressively stronger and regular, and they become more frequent.  Manage discomfort from Braxton Hicks contractions  by changing position, resting in a warm bath, drinking plenty of water, or practicing deep breathing. This information is not intended to replace advice given to you by your health care provider. Make sure you discuss any questions you have with your health care provider. Document Released: 05/11/2016 Document Revised: 12/08/2016 Document Reviewed: 05/11/2016 Elsevier Patient Education  2020 Elsevier Inc.  

## 2018-12-31 NOTE — Progress Notes (Signed)
   PRENATAL VISIT NOTE  Subjective:  Nancy Henson is a 23 y.o. G4P3003 at [redacted]w[redacted]d being seen today for ongoing prenatal care.  She is currently monitored for the following issues for this low-risk pregnancy and has Migraine headache without aura; Major depressive disorder, recurrent severe without psychotic features ([place]); Cocaine abuse ([place]); Supervision of high risk pregnancy, antepartum; History of substance abuse ([place]); Unwanted fertility; and Decreased fetal movements affecting management of mother, antepartum on their problem list.  Patient reports no complaints.  Contractions: Not present. Vag. Bleeding: None.  Movement: Present. Denies leaking of fluid.   The following portions of the patient's history were reviewed and updated as appropriate: allergies, current medications, past family history, past medical history, past social history, past surgical history and problem list.   Objective:   Vitals:   12/31/18 1428  BP: 126/86  Pulse: (!) 102  Weight: 177 lb (80.3 kg)    Fetal Status: Fetal Heart Rate (bpm): 156 Fundal Height: 37 cm Movement: Present  Presentation: Vertex  General:  Alert, oriented and cooperative. Patient is in no acute distress.  Skin: Skin is warm and dry. No rash noted.   Cardiovascular: Normal heart rate noted  Respiratory: Normal respiratory effort, no problems with respiration noted  Abdomen: Soft, gravid, appropriate for gestational age.  Pain/Pressure: Present     Pelvic: Cervical exam performed Dilation: 1 Effacement (%): Thick Station: Ballotable, -3  Extremities: Normal range of motion.  Edema: None  Mental Status: Normal mood and affect. Normal behavior. Normal judgment and thought content.   Assessment and Plan:  Pregnancy: G4P3003 at [redacted]w[redacted]d  1. Supervision of high risk pregnancy, antepartum  Doing well. Discussed the [employer]. In-person visit next week; patient wants cervix checked again.    Term labor symptoms and general  obstetric precautions including but not limited to vaginal bleeding, contractions, leaking of fluid and fetal movement were reviewed in detail with the patient. Please refer to After Visit Summary for other counseling recommendations.   Return in about 1 week (around 01/07/2019) for In-Person visit .  Future Appointments  Date Time Provider [place]  01/07/2019  9:55 AM Ardean Larsen, Mervyn Skeeters, CNM [facility], NP

## 2018-12-31 NOTE — MAU Note (Signed)
Was seen at office today and was 1cm. Ctx all day. Using an app that said I should come to hospital since my ctxs were 22mins apart lasting about 42mins. Ctxs have slowed now but just wanted to be checked. Denies LOF or vag bleeding.

## 2018-12-31 NOTE — Patient Instructions (Signed)
Cervical Ripening: May try one or both  Red Raspberry Leaf capsules:  two 300mg or 400mg tablets with each meal, 2-3 times a day  Potential Side Effects Of Raspberry Leaf:  Most women do not experience any side effects from drinking raspberry leaf tea. However, nausea and loose stools are possible     Evening Primrose Oil capsules: may take 1 to 3 capsules daily. May also prick one to release the oil and insert it into your vagina at night.  Some of the potential side effects:  Upset stomach  Loose stools or diarrhea  Headaches  Nausea:     The MilesCircuit  This circuit takes at least 90 minutes to complete so clear your schedule and make mental preparations so you can relax in your environment. The second step requires a lot of pillows so gather them up before beginning Before starting, you should empty your bladder! Have a nice drink nearby, and make sure it has a straw! If you are having contractions, this circuit should be done through contractions, try not to change positions between steps Before you begin...  "I named this 'circuit' after my friend Megan Miles, who shared and discussed it with me when I was working with a client whose labor seemed to be stalled out and no longer progressing... This circuit is useful to help get the baby lined up, ideally, in the "Left Occiput Anterior" (LOA) Position, both before labor begins and when some corrections need to be done during labor. Prenatally, this position set can help to rotate a baby. As a natural method of induction, this can help get things going if baby just needed a gentle nudge of position to set things off. To the best of my knowledge, this group of positions will not "hurt" a baby that is already lined up correctly." - Sharon Muza   Step One: Open-knee Chest Stay in this position for 30 minutes, start in cat/cow, then drop your chest as low as you can to the bed or the floor and your bottom as high as  you can. Knees should be fairly wide apart, and the angle between the torso/thighs should be wider than 90 degrees. Wiggle around, prop with lots of pillows and use this time to get totally relaxed. This position allows the baby to scoot out of the pelvis a bit and gives them room to rotate, shift their head position, etc. If the pregnant person finds it helpful, careful positioning with a rebozo under the belly, with gentle tension from a support person behind can help maintain this position for the full 30 minutes.  Step Two:Exaggerated Left Side Lying Roll to your left side, bringing your top leg as high as possible and keeping your bottom leg straight. Roll forward as much as possible, again using a lot of pillows. Sink into the bed and relax some more. If you fall asleep, that's totally okay and you can stay there! If not, stay here for at least another half an hour. Try and get your top right leg up towards your head and get as rolled over onto your belly as much as possible. If you repeat the circuit during labor, try alternating left and right sides. We know the photo the left is actually right side... just flip the image in your head.  Step Three: Moving and Lunges Lunge, walk stairs facing sideways, 2 at a time, (have a spotter downstairs of you!), take a walk outside with one foot on the curb and the   other on the street, sit on a birth ball and hula- anything that's upright and putting your pelvis in open, asymmetrical positions. Spend at least 30 minutes doing this one as well to give your baby a chance to move down. If you are lunging or stair or curb walking, you should lunge/walk/go up stairs in the direction that feels better to you. The key with the lunge is that the toes of the higher leg and mom's belly button should be at right angles. Do not lunge over your knee, that closes the pelvis.     Megan Hamilton Miles: Circuit Creator -  [email or web address] Sharon Muza, CD, BDT (DONA), LCCE, FACCE: Supporting Content - [email or web address] Emily Weaver Brown: Photography - [email or web address] Kate Dewey CD/CDT (BAI): Print and Webmaster - [email or web address] MilesCircuit [employer] The Miles Circuit [email or web address] 

## 2019-01-02 ENCOUNTER — Encounter (HOSPITAL_COMMUNITY): Payer: Self-pay | Admitting: Obstetrics & Gynecology

## 2019-01-02 ENCOUNTER — Inpatient Hospital Stay (HOSPITAL_COMMUNITY)
Admission: AD | Admit: 2019-01-02 | Discharge: 2019-01-02 | Disposition: A | Discharge status: Home / Self Care | Payer: Medicaid Other | Attending: Obstetrics & Gynecology | Admitting: Obstetrics & Gynecology

## 2019-01-02 ENCOUNTER — Other Ambulatory Visit: Payer: Self-pay

## 2019-01-02 DIAGNOSIS — O368130 Decreased fetal movements, third trimester, not applicable or unspecified: Secondary | ICD-10-CM | POA: Diagnosis not present

## 2019-01-02 DIAGNOSIS — O471 False labor at or after 37 completed weeks of gestation: Secondary | ICD-10-CM | POA: Insufficient documentation

## 2019-01-02 DIAGNOSIS — Z3689 Encounter for other specified antenatal screening: Secondary | ICD-10-CM

## 2019-01-02 DIAGNOSIS — Z3A38 38 weeks gestation of pregnancy: Secondary | ICD-10-CM

## 2019-01-02 NOTE — Discharge Instructions (Signed)
Reasons to return to MAU:  1.  Contractions are  5 minutes apart or less, each last 1 minute, these have been going on for 1-2 hours, and you cannot walk or talk during them 2.  You have a large gush of fluid, or a trickle of fluid that will not stop and you have to wear a pad 3.  You have bleeding that is bright red, heavier than spotting--like menstrual bleeding (spotting can be normal in early labor or after a check of your cervix) 4.  You do not feel the baby moving like he/she normally does   Cervical Ripening: May try one or both  Red Raspberry Leaf tea: 2-3 times a day  Potential Side Effects Of Raspberry Leaf:  Most women do not experience any side effects from drinking raspberry leaf tea. However, nausea and loose stools are possible  Evening Primrose Oil capsules: may take 1 to 3 capsules daily. May also prick one to release the oil and insert it into your vagina at night.  Some of the potential side effects:  Upset stomach  Loose stools or diarrhea  Headaches  Nausea:

## 2019-01-02 NOTE — MAU Note (Signed)
Pt states that she had pain in the bottom of her stomach around 2030-2150. She states the pain brought on ctx's and now she has no pain.   Pt reports decreased fetal movement today.   Denies vaginal bleeding or LOF.

## 2019-01-02 NOTE — MAU Provider Note (Signed)
First Provider Initiated Contact with Patient 01/02/19 2254      S: Ms. Nancy Henson is a 23 y.o. [phone] at [redacted]w[redacted]d  who presents to MAU today complaining contractions q 7-8 minutes since 2030 then stopped at 2150. She denies vaginal bleeding. She denies LOF. She reported decreased fetal movement initially in MAU but reports good/normal FM now.  She denies abdominal pain or contractions at this time.   O: BP 108/60 (BP Location: Right Arm)   Pulse 87   Temp 98.3 F (36.8 C)   Resp 16   Wt 80.8 kg   LMP 04/10/2018   SpO2 98%   BMI 31.54 kg/m  GENERAL: Well-developed, well-nourished female in no acute distress.  HEAD: Normocephalic, atraumatic.  CHEST: Normal effort of breathing, regular heart rate ABDOMEN: Soft, nontender, gravid  Cervical exam: no cervical change from last visit  Dilation: 1.5 Effacement (%): Thick Cervical Position: Anterior Station: Ballotable Presentation: Vertex Exam by:: Wende Bushy CNM   Fetal Monitoring: Baseline: 140 Variability: moderate Accelerations: present  Decelerations: none Contractions: none   A: SIUP at [redacted]w[redacted]d  NST reactive and reassuring  False labor  P: Discharge home Labor precautions  Discussed use of EPO, RRT to stimulate contractions  Fetal kick counts  Hydration and rest   Lajean Manes, CNM 01/02/2019 11:13 PM

## 2019-01-06 ENCOUNTER — Encounter: Payer: Self-pay | Admitting: *Deleted

## 2019-01-07 ENCOUNTER — Ambulatory Visit (INDEPENDENT_AMBULATORY_CARE_PROVIDER_SITE_OTHER): Payer: Medicaid Other | Admitting: Student

## 2019-01-07 ENCOUNTER — Other Ambulatory Visit: Payer: Self-pay

## 2019-01-07 DIAGNOSIS — O0993 Supervision of high risk pregnancy, unspecified, third trimester: Secondary | ICD-10-CM

## 2019-01-07 DIAGNOSIS — Z3A38 38 weeks gestation of pregnancy: Secondary | ICD-10-CM

## 2019-01-07 DIAGNOSIS — O0990 Supervision of high risk pregnancy, unspecified, unspecified trimester: Secondary | ICD-10-CM

## 2019-01-07 NOTE — Progress Notes (Signed)
   PRENATAL VISIT NOTE  Subjective:  Nancy Henson is a 23 y.o. G4P3003 at [redacted]w[redacted]d being seen today for ongoing prenatal care.  She is currently monitored for the following issues for this low-risk pregnancy and has Migraine headache without aura; Major depressive disorder, recurrent severe without psychotic features ([place]); Cocaine abuse ([place]); Supervision of high risk pregnancy, antepartum; History of substance abuse ([place]); Unwanted fertility; and Decreased fetal movements affecting management of mother, antepartum on their problem list.  Patient reports tired of being pregnant . She has not picked up her BP cuff because it was too far from her house.   Contractions: Not present. Vag. Bleeding: None.  Movement: Present. Denies leaking of fluid.   The following portions of the patient's history were reviewed and updated as appropriate: allergies, current medications, past family history, past medical history, past social history, past surgical history and problem list.   Objective:   Vitals:   01/07/19 1007  BP: 113/66  Pulse: 85  Weight: 177 lb 9.6 oz (80.6 kg)    Fetal Status: Fetal Heart Rate (bpm): 161 Fundal Height: 39 cm Movement: Present     General:  Alert, oriented and cooperative. Patient is in no acute distress.  Skin: Skin is warm and dry. No rash noted.   Cardiovascular: Normal heart rate noted  Respiratory: Normal respiratory effort, no problems with respiration noted  Abdomen: Soft, gravid, appropriate for gestational age.  Pain/Pressure: Present     Pelvic: Cervical exam performed Dilation: 1 Effacement (%): Thick Station: Ballotable  Extremities: Normal range of motion.  Edema: None  Mental Status: Normal mood and affect. Normal behavior. Normal judgment and thought content.   Assessment and Plan:  Pregnancy: G4P3003 at [redacted]w[redacted]d  1. Supervision of high risk pregnancy, antepartum   -Patient is agreeable to FB induction before her IOL on 1/13; plan to schedule at her  next visit on 1/7 -IOL scheduled and orders placed -Encouraged patient to pick up BP cuff, although only one week left in pregnancy. It is a good idea for her to check her BP at least once between visits, and if she develops any BP issues postpartum, she will have the cuff and be able to monitor at home. Patient agrees to pick up BP cuff.   Term labor symptoms and general obstetric precautions including but not limited to vaginal bleeding, contractions, leaking of fluid and fetal movement were reviewed in detail with the patient. Please refer to After Visit Summary for other counseling recommendations.   Return 8 days for LROB in person and NST/BPP.  Future Appointments  Date Time Provider [place]  01/16/2019  8:15 AM [facility] NST [facility] [place]  01/16/2019  9:55 AM Rasch, Artist Pais, NP [facility] WOC    Mervyn Skeeters [place], [place]

## 2019-01-10 NOTE — L&D Delivery Note (Addendum)
OB/GYN Faculty Practice Delivery Note  Nancy Henson is a 24 y.o. [id number] s/p spontaneous vaginal at [redacted]w[redacted]d. She was admitted for Contractions  GBS Status: Negative/-- (12/14 0000) Maximum Maternal Temperature: Temp (24hrs), Avg:98.2 F (36.8 C), Min:97.7 F (36.5 C), Max:98.9 F (37.2 C)   Labor Progress: Admitted in latent labor AROM 0h 16m prior to delivery with clear, light mec stained fluid  Complete dilation achieved.   Delivery Date/Time: 01/14/2019 at 0725 Delivery: Called to room and patient was complete and pushing. Head delivered ROA.  No nuchal cord present. . Shoulder and body delivered in usual fashion. Infant with spontaneous cry, placed skin to skin on mother's abdomen, dried and stimulated. Cord clamped x 2 after 1-minute delay, and cut by FOB under my direct supervision. Cord blood drawn. Placenta delivered spontaneously with gentle cord traction. Fundus firm with massage and Pitocin. Labia, perineum, vagina, and cervix inspected with no lacerations.  Placenta: spontaneous , intact  Complications: none Lacerations: N/A EBL: 50 mL  Analgesia: Epidural anesthesia  Postpartum Planning Mom and baby to mother/baby.  Lactation consult AM CBC Contraception BTL Social work: previous substance abuse   Infant: Viable female  APGAR: 62  9g  Genia Hotter, M.D.  01/14/2019 7:59 PM  GME ATTESTATION:  I was gowned and gloved throughout the entire the delivery and supervised the resident. I agree with the findings and the plan of care as documented in the resident's note.  Marlowe Alt, DO OB Fellow, Faculty [facility], Center for [facility] 01/14/2019 8:34 PM

## 2019-01-13 ENCOUNTER — Telehealth (HOSPITAL_COMMUNITY): Payer: Self-pay | Admitting: *Deleted

## 2019-01-13 NOTE — Telephone Encounter (Signed)
Preadmission screen  

## 2019-01-14 ENCOUNTER — Inpatient Hospital Stay (HOSPITAL_COMMUNITY): Payer: Medicaid Other | Admitting: Certified Registered Nurse Anesthetist

## 2019-01-14 ENCOUNTER — Inpatient Hospital Stay (HOSPITAL_COMMUNITY)
Admission: AD | Admit: 2019-01-14 | Discharge: 2019-01-16 | DRG: 798 | Disposition: A | Discharge status: Home / Self Care | Payer: Medicaid Other | Attending: Obstetrics and Gynecology | Admitting: Obstetrics and Gynecology

## 2019-01-14 ENCOUNTER — Inpatient Hospital Stay (HOSPITAL_COMMUNITY): Payer: Medicaid Other | Admitting: Anesthesiology

## 2019-01-14 ENCOUNTER — Encounter (HOSPITAL_COMMUNITY): Payer: Self-pay | Admitting: Obstetrics and Gynecology

## 2019-01-14 ENCOUNTER — Encounter (HOSPITAL_COMMUNITY)
Admission: AD | Disposition: A | Discharge status: Home / Self Care | Payer: Self-pay | Source: Home / Self Care | Attending: Obstetrics and Gynecology

## 2019-01-14 ENCOUNTER — Other Ambulatory Visit: Payer: Self-pay

## 2019-01-14 DIAGNOSIS — Z302 Encounter for sterilization: Secondary | ICD-10-CM

## 2019-01-14 DIAGNOSIS — F17210 Nicotine dependence, cigarettes, uncomplicated: Secondary | ICD-10-CM | POA: Diagnosis present

## 2019-01-14 DIAGNOSIS — Z20822 Contact with and (suspected) exposure to covid-19: Secondary | ICD-10-CM | POA: Diagnosis present

## 2019-01-14 DIAGNOSIS — Z3A39 39 weeks gestation of pregnancy: Secondary | ICD-10-CM

## 2019-01-14 DIAGNOSIS — Z3490 Encounter for supervision of normal pregnancy, unspecified, unspecified trimester: Secondary | ICD-10-CM

## 2019-01-14 DIAGNOSIS — O99334 Smoking (tobacco) complicating childbirth: Secondary | ICD-10-CM | POA: Diagnosis present

## 2019-01-14 DIAGNOSIS — O26893 Other specified pregnancy related conditions, third trimester: Secondary | ICD-10-CM | POA: Diagnosis present

## 2019-01-14 HISTORY — PX: TUBAL LIGATION: SHX77

## 2019-01-14 LAB — TYPE AND SCREEN
ABO/RH(D): O POS
Antibody Screen: NEGATIVE

## 2019-01-14 LAB — RESPIRATORY PANEL BY RT PCR (FLU A&B, COVID)
Influenza A by PCR: NEGATIVE
Influenza B by PCR: NEGATIVE
SARS Coronavirus 2 by RT PCR: NEGATIVE

## 2019-01-14 LAB — CBC
HCT: 37.9 % (ref 36.0–46.0)
Hemoglobin: 12.6 g/dL (ref 12.0–15.0)
MCH: 31 pg (ref 26.0–34.0)
MCHC: 33.2 g/dL (ref 30.0–36.0)
MCV: 93.3 fL (ref 80.0–100.0)
Platelets: 225 10*3/uL (ref 150–400)
RBC: 4.06 MIL/uL (ref 3.87–5.11)
RDW: 13.6 % (ref 11.5–15.5)
WBC: 11.2 10*3/uL — ABNORMAL HIGH (ref 4.0–10.5)
nRBC: 0 % (ref 0.0–0.2)

## 2019-01-14 LAB — RAPID URINE DRUG SCREEN, HOSP PERFORMED
Amphetamines: NOT DETECTED
Barbiturates: NOT DETECTED
Benzodiazepines: NOT DETECTED
Cocaine: NOT DETECTED
Opiates: NOT DETECTED
Tetrahydrocannabinol: NOT DETECTED

## 2019-01-14 LAB — ABO/RH: ABO/RH(D): O POS

## 2019-01-14 LAB — SYPHILIS: RPR W/REFLEX TO RPR TITER AND TREPONEMAL ANTIBODIES, TRADITIONAL SCREENING AND DIAGNOSIS ALGORITHM: RPR Ser Ql: NONREACTIVE

## 2019-01-14 SURGERY — LIGATION, FALLOPIAN TUBE, POSTPARTUM
Anesthesia: Choice | Wound class: Clean Contaminated

## 2019-01-14 MED ORDER — IBUPROFEN 600 MG PO TABS
600.0000 mg | ORAL_TABLET | Freq: Three times a day (TID) | ORAL | Status: DC | PRN
  Administered 2019-01-14 – 2019-01-16 (×3): 600 mg via ORAL
  Filled 2019-01-14 (×3): qty 1

## 2019-01-14 MED ORDER — SODIUM CHLORIDE 0.9% FLUSH: 3.0000 mL | INTRAVENOUS | Status: DC | PRN

## 2019-01-14 MED ORDER — LIDOCAINE-EPINEPHRINE (PF) 2 %-1:200000 IJ SOLN
INTRAMUSCULAR | Status: AC
  Filled 2019-01-14: qty 20

## 2019-01-14 MED ORDER — DIBUCAINE (PERIANAL) 1 % EX OINT: 1.0000 "application " | TOPICAL_OINTMENT | CUTANEOUS | Status: DC | PRN

## 2019-01-14 MED ORDER — OXYCODONE HCL 5 MG PO TABS: 5.0000 mg | ORAL_TABLET | ORAL | Status: DC | PRN

## 2019-01-14 MED ORDER — LIDOCAINE-EPINEPHRINE (PF) 2 %-1:200000 IJ SOLN
INTRAMUSCULAR | Status: DC | PRN
  Administered 2019-01-14 (×2): 5 mL via INTRADERMAL
  Administered 2019-01-14: 3 mL via INTRADERMAL

## 2019-01-14 MED ORDER — FENTANYL-BUPIVACAINE-NACL 0.5-0.125-0.9 MG/250ML-% EP SOLN
12.0000 mL/h | EPIDURAL | Status: DC | PRN
  Filled 2019-01-14: qty 250

## 2019-01-14 MED ORDER — FENTANYL CITRATE (PF) 100 MCG/2ML IJ SOLN
INTRAMUSCULAR | Status: AC
  Filled 2019-01-14: qty 2

## 2019-01-14 MED ORDER — FENTANYL CITRATE (PF) 100 MCG/2ML IJ SOLN: 25.0000 ug | INTRAMUSCULAR | Status: DC | PRN

## 2019-01-14 MED ORDER — LACTATED RINGERS IV SOLN: 500.0000 mL | INTRAVENOUS | Status: DC | PRN

## 2019-01-14 MED ORDER — DIPHENHYDRAMINE HCL 50 MG/ML IJ SOLN: 12.5000 mg | INTRAMUSCULAR | Status: DC | PRN

## 2019-01-14 MED ORDER — NALOXONE HCL 0.4 MG/ML IJ SOLN: 0.4000 mg | INTRAMUSCULAR | Status: DC | PRN

## 2019-01-14 MED ORDER — NALBUPHINE HCL 10 MG/ML IJ SOLN: 5.0000 mg | Freq: Once | INTRAMUSCULAR | Status: DC | PRN

## 2019-01-14 MED ORDER — OXYCODONE HCL 5 MG/5ML PO SOLN: 5.0000 mg | Freq: Once | ORAL | Status: DC | PRN

## 2019-01-14 MED ORDER — ONDANSETRON HCL 4 MG/2ML IJ SOLN: 4.0000 mg | Freq: Three times a day (TID) | INTRAMUSCULAR | Status: DC | PRN

## 2019-01-14 MED ORDER — NALBUPHINE HCL 10 MG/ML IJ SOLN: 5.0000 mg | INTRAMUSCULAR | Status: DC | PRN

## 2019-01-14 MED ORDER — EPHEDRINE 5 MG/ML INJ
10.0000 mg | INTRAVENOUS | Status: DC | PRN
  Filled 2019-01-14: qty 2

## 2019-01-14 MED ORDER — SCOPOLAMINE 1 MG/3DAYS TD PT72: 1.0000 | MEDICATED_PATCH | Freq: Once | TRANSDERMAL | Status: DC

## 2019-01-14 MED ORDER — MIDAZOLAM HCL 2 MG/2ML IJ SOLN
INTRAMUSCULAR | Status: DC | PRN
  Administered 2019-01-14 (×2): 1 mg via INTRAVENOUS

## 2019-01-14 MED ORDER — SODIUM CHLORIDE 0.9% FLUSH: 3.0000 mL | Freq: Two times a day (BID) | INTRAVENOUS | Status: DC

## 2019-01-14 MED ORDER — ONDANSETRON HCL 4 MG/2ML IJ SOLN: 4.0000 mg | INTRAMUSCULAR | Status: DC | PRN

## 2019-01-14 MED ORDER — OXYTOCIN BOLUS FROM INFUSION
500.0000 mL | Freq: Once | INTRAVENOUS | Status: AC
  Administered 2019-01-14: 500 mL via INTRAVENOUS

## 2019-01-14 MED ORDER — DIPHENHYDRAMINE HCL 25 MG PO CAPS: 25.0000 mg | ORAL_CAPSULE | ORAL | Status: DC | PRN

## 2019-01-14 MED ORDER — FENTANYL CITRATE (PF) 100 MCG/2ML IJ SOLN
INTRAMUSCULAR | Status: DC | PRN
  Administered 2019-01-14: 100 ug via EPIDURAL

## 2019-01-14 MED ORDER — WITCH HAZEL-GLYCERIN EX PADS: 1.0000 "application " | MEDICATED_PAD | CUTANEOUS | Status: DC | PRN

## 2019-01-14 MED ORDER — IBUPROFEN 600 MG PO TABS: 600.0000 mg | ORAL_TABLET | Freq: Four times a day (QID) | ORAL | Status: DC | PRN

## 2019-01-14 MED ORDER — OXYCODONE HCL 5 MG PO TABS
10.0000 mg | ORAL_TABLET | ORAL | Status: DC | PRN
  Administered 2019-01-14 – 2019-01-16 (×3): 10 mg via ORAL
  Filled 2019-01-14 (×3): qty 2

## 2019-01-14 MED ORDER — SIMETHICONE 80 MG PO CHEW
80.0000 mg | CHEWABLE_TABLET | ORAL | Status: DC | PRN
  Administered 2019-01-14 – 2019-01-16 (×4): 80 mg via ORAL
  Filled 2019-01-14 (×4): qty 1

## 2019-01-14 MED ORDER — KETOROLAC TROMETHAMINE 30 MG/ML IJ SOLN
INTRAMUSCULAR | Status: AC
  Filled 2019-01-14: qty 1

## 2019-01-14 MED ORDER — ONDANSETRON HCL 4 MG/2ML IJ SOLN
INTRAMUSCULAR | Status: DC | PRN
  Administered 2019-01-14: 4 mg via INTRAVENOUS

## 2019-01-14 MED ORDER — KETOROLAC TROMETHAMINE 30 MG/ML IJ SOLN
30.0000 mg | Freq: Once | INTRAMUSCULAR | Status: AC
  Administered 2019-01-14: 30 mg via INTRAVENOUS
  Filled 2019-01-14: qty 1

## 2019-01-14 MED ORDER — MIDAZOLAM HCL 2 MG/2ML IJ SOLN
INTRAMUSCULAR | Status: AC
  Filled 2019-01-14: qty 2

## 2019-01-14 MED ORDER — ACETAMINOPHEN 325 MG PO TABS
650.0000 mg | ORAL_TABLET | ORAL | Status: DC | PRN
  Administered 2019-01-14: 650 mg via ORAL
  Filled 2019-01-14: qty 2

## 2019-01-14 MED ORDER — ONDANSETRON HCL 4 MG/2ML IJ SOLN: 4.0000 mg | Freq: Once | INTRAMUSCULAR | Status: DC | PRN

## 2019-01-14 MED ORDER — KETOROLAC TROMETHAMINE 30 MG/ML IJ SOLN
30.0000 mg | Freq: Once | INTRAMUSCULAR | Status: AC | PRN
  Administered 2019-01-14: 30 mg via INTRAVENOUS

## 2019-01-14 MED ORDER — SOD CITRATE-CITRIC ACID 500-334 MG/5ML PO SOLN
30.0000 mL | ORAL | Status: DC | PRN
  Administered 2019-01-14: 30 mL via ORAL
  Filled 2019-01-14: qty 30

## 2019-01-14 MED ORDER — PHENYLEPHRINE 40 MCG/ML (10ML) SYRINGE FOR IV PUSH (FOR BLOOD PRESSURE SUPPORT)
80.0000 ug | PREFILLED_SYRINGE | INTRAVENOUS | Status: DC | PRN
  Filled 2019-01-14: qty 10

## 2019-01-14 MED ORDER — OXYCODONE-ACETAMINOPHEN 5-325 MG PO TABS: 2.0000 | ORAL_TABLET | ORAL | Status: DC | PRN

## 2019-01-14 MED ORDER — ONDANSETRON HCL 4 MG/2ML IJ SOLN
INTRAMUSCULAR | Status: AC
  Filled 2019-01-14: qty 2

## 2019-01-14 MED ORDER — NALOXONE HCL 4 MG/10ML IJ SOLN
1.0000 ug/kg/h | INTRAVENOUS | Status: DC | PRN
  Filled 2019-01-14: qty 5

## 2019-01-14 MED ORDER — LIDOCAINE HCL (PF) 1 % IJ SOLN
INTRAMUSCULAR | Status: DC | PRN
  Administered 2019-01-14: 2 mL via EPIDURAL
  Administered 2019-01-14: 10 mL via EPIDURAL

## 2019-01-14 MED ORDER — OXYTOCIN 40 UNITS IN NORMAL SALINE INFUSION - SIMPLE MED
2.5000 [IU]/h | INTRAVENOUS | Status: DC
  Filled 2019-01-14: qty 1000

## 2019-01-14 MED ORDER — BUPIVACAINE HCL (PF) 0.25 % IJ SOLN
INTRAMUSCULAR | Status: DC | PRN
  Administered 2019-01-14: 20 mL
  Administered 2019-01-14: 10 mL

## 2019-01-14 MED ORDER — OXYCODONE HCL 5 MG PO TABS
5.0000 mg | ORAL_TABLET | ORAL | Status: DC | PRN
  Administered 2019-01-14 – 2019-01-15 (×3): 5 mg via ORAL
  Filled 2019-01-14 (×3): qty 1

## 2019-01-14 MED ORDER — SIMETHICONE 80 MG PO CHEW: 80.0000 mg | CHEWABLE_TABLET | ORAL | Status: DC | PRN

## 2019-01-14 MED ORDER — ONDANSETRON HCL 4 MG/2ML IJ SOLN: 4.0000 mg | Freq: Four times a day (QID) | INTRAMUSCULAR | Status: DC | PRN

## 2019-01-14 MED ORDER — ONDANSETRON HCL 4 MG PO TABS: 4.0000 mg | ORAL_TABLET | ORAL | Status: DC | PRN

## 2019-01-14 MED ORDER — SODIUM CHLORIDE (PF) 0.9 % IJ SOLN
INTRAMUSCULAR | Status: DC | PRN
  Administered 2019-01-14: 12 mL/h via EPIDURAL

## 2019-01-14 MED ORDER — LACTATED RINGERS IV SOLN: 500.0000 mL | Freq: Once | INTRAVENOUS | Status: DC

## 2019-01-14 MED ORDER — PRENATAL MULTIVITAMIN CH
1.0000 | ORAL_TABLET | Freq: Every day | ORAL | Status: DC
  Administered 2019-01-15: 1 via ORAL
  Filled 2019-01-14: qty 1

## 2019-01-14 MED ORDER — BENZOCAINE-MENTHOL 20-0.5 % EX AERO: 1.0000 "application " | INHALATION_SPRAY | CUTANEOUS | Status: DC | PRN

## 2019-01-14 MED ORDER — LIDOCAINE HCL (PF) 1 % IJ SOLN: 30.0000 mL | INTRAMUSCULAR | Status: DC | PRN

## 2019-01-14 MED ORDER — OXYCODONE HCL 5 MG PO TABS: 5.0000 mg | ORAL_TABLET | Freq: Once | ORAL | Status: DC | PRN

## 2019-01-14 MED ORDER — DIPHENHYDRAMINE HCL 25 MG PO CAPS: 25.0000 mg | ORAL_CAPSULE | Freq: Four times a day (QID) | ORAL | Status: DC | PRN

## 2019-01-14 MED ORDER — OXYCODONE-ACETAMINOPHEN 5-325 MG PO TABS: 1.0000 | ORAL_TABLET | ORAL | Status: DC | PRN

## 2019-01-14 MED ORDER — ACETAMINOPHEN 325 MG PO TABS
650.0000 mg | ORAL_TABLET | Freq: Four times a day (QID) | ORAL | Status: DC | PRN
  Administered 2019-01-14 – 2019-01-15 (×4): 650 mg via ORAL
  Filled 2019-01-14 (×5): qty 2

## 2019-01-14 MED ORDER — TETANUS-DIPHTH-ACELL PERTUSSIS 5-2.5-18.5 LF-MCG/0.5 IM SUSP: 0.5000 mL | Freq: Once | INTRAMUSCULAR | Status: DC

## 2019-01-14 MED ORDER — SENNOSIDES-DOCUSATE SODIUM 8.6-50 MG PO TABS
2.0000 | ORAL_TABLET | ORAL | Status: DC
  Administered 2019-01-14 – 2019-01-15 (×2): 2 via ORAL
  Filled 2019-01-14 (×2): qty 2

## 2019-01-14 MED ORDER — LACTATED RINGERS IV SOLN: INTRAVENOUS | Status: DC

## 2019-01-14 MED ORDER — MEASLES, MUMPS & RUBELLA VAC IJ SOLR: 0.5000 mL | Freq: Once | INTRAMUSCULAR | Status: DC

## 2019-01-14 MED ORDER — BUPIVACAINE HCL (PF) 0.25 % IJ SOLN
INTRAMUSCULAR | Status: AC
  Filled 2019-01-14: qty 30

## 2019-01-14 MED ORDER — COCONUT OIL OIL: 1.0000 "application " | TOPICAL_OIL | Status: DC | PRN

## 2019-01-14 MED ORDER — LACTATED RINGERS IV SOLN: INTRAVENOUS | Status: DC | PRN

## 2019-01-14 MED ORDER — MEPERIDINE HCL 25 MG/ML IJ SOLN: 6.2500 mg | INTRAMUSCULAR | Status: DC | PRN

## 2019-01-14 SURGICAL SUPPLY — 23 items
BLADE SURG 11 STRL SS (BLADE) ×3 IMPLANT
CLOSURE WOUND 1/2 X4 (GAUZE/BANDAGES/DRESSINGS) ×1
DRESSING OPSITE X SMALL 2X3 (GAUZE/BANDAGES/DRESSINGS) ×2 IMPLANT
DRSG OPSITE POSTOP 3X4 (GAUZE/BANDAGES/DRESSINGS) ×3 IMPLANT
DURAPREP 26ML APPLICATOR (WOUND CARE) ×3 IMPLANT
GLOVE BIOGEL PI IND STRL 7.0 (GLOVE) ×1 IMPLANT
GLOVE BIOGEL PI IND STRL 7.5 (GLOVE) ×1 IMPLANT
GLOVE BIOGEL PI INDICATOR 7.0 (GLOVE) ×2
GLOVE BIOGEL PI INDICATOR 7.5 (GLOVE) ×2
GLOVE ECLIPSE 7.5 STRL STRAW (GLOVE) ×3 IMPLANT
GOWN STRL REUS W/TWL LRG LVL3 (GOWN DISPOSABLE) ×6 IMPLANT
HIBICLENS CHG 4% 4OZ BTL (MISCELLANEOUS) ×3 IMPLANT
NEEDLE HYPO 22GX1.5 SAFETY (NEEDLE) ×3 IMPLANT
NS IRRIG 1000ML POUR BTL (IV SOLUTION) ×3 IMPLANT
PACK ABDOMINAL MINOR (CUSTOM PROCEDURE TRAY) ×3 IMPLANT
PROTECTOR NERVE ULNAR (MISCELLANEOUS) ×3 IMPLANT
SPONGE LAP 4X18 RFD (DISPOSABLE) IMPLANT
STRIP CLOSURE SKIN 1/2X4 (GAUZE/BANDAGES/DRESSINGS) ×1 IMPLANT
SUT VICRYL 0 UR6 27IN ABS (SUTURE) ×3 IMPLANT
SUT VICRYL 4-0 PS2 18IN ABS (SUTURE) ×3 IMPLANT
SYR CONTROL 10ML LL (SYRINGE) ×3 IMPLANT
TOWEL OR 17X24 6PK STRL BLUE (TOWEL DISPOSABLE) ×6 IMPLANT
TRAY FOLEY W/BAG SLVR 14FR (SET/KITS/TRAYS/PACK) ×3 IMPLANT

## 2019-01-14 NOTE — Progress Notes (Signed)
Dr. Selena Batten notified of patient complaining of no relief of 7/10 pain from cramping even with 10mg  of oxycodone and sharp pain radiating onto chest and shoulder. Dr. notified patient vital signs are stable. Orders received for toradol and simethicone. Dr. Selena Batten states she will come evaluate pt if this provides no relief.

## 2019-01-14 NOTE — Transfer of Care (Signed)
Immediate Anesthesia Transfer of Care Note  Patient: Nancy Henson  Procedure(s) Performed: POST PARTUM TUBAL LIGATION (N/A )  Patient Location: PACU  Anesthesia Type:Epidural  Level of Consciousness: awake, alert  and oriented  Airway & Oxygen Therapy: Patient Spontanous Breathing  Post-op Assessment: Report given to RN and Post -op Vital signs reviewed and stable  Post vital signs: Reviewed and stable  Last Vitals:  Vitals Value Taken Time  BP 118/48 01/14/19 1201  Temp    Pulse 73 01/14/19 1203  Resp 18 01/14/19 1203  SpO2 99 % 01/14/19 1203  Vitals shown include unvalidated device data.  Last Pain:  Vitals:   01/14/19 1050  TempSrc:   PainSc: 0-No pain         Complications: No apparent anesthesia complications

## 2019-01-14 NOTE — MAU Note (Signed)
Patient reports to MAU c/o ctx every or closer. No bleeding or LOF. +FM.

## 2019-01-14 NOTE — Anesthesia Preprocedure Evaluation (Signed)
Anesthesia Evaluation  Patient identified by MRN, date of birth, ID band Patient awake    Reviewed: Allergy & Precautions, NPO status , Patient's Chart, lab work & pertinent test results  Airway Mallampati: II  TM Distance: >3 FB Neck ROM: Full    Dental no notable dental hx. (+) Teeth Intact   Pulmonary Current Smoker,    Pulmonary exam normal breath sounds clear to auscultation       Cardiovascular negative cardio ROS Normal cardiovascular exam Rhythm:Regular Rate:Normal     Neuro/Psych  Headaches, PSYCHIATRIC DISORDERS Anxiety Depression    GI/Hepatic GERD  Controlled,(+)     substance abuse  , Hx drug abuse- cocaine- not currently   Endo/Other  negative endocrine ROS  Renal/GU negative Renal ROS  negative genitourinary   Musculoskeletal negative musculoskeletal ROS (+)   Abdominal   Peds  Hematology  (+) anemia ,   Anesthesia Other Findings   Reproductive/Obstetrics Postpartum day 0                              Anesthesia Physical  Anesthesia Plan  ASA: II  Anesthesia Plan: Epidural   Post-op Pain Management:    Induction:   PONV Risk Score and Plan: 2 and Treatment may vary due to age or medical condition  Airway Management Planned: Natural Airway  Additional Equipment: None  Intra-op Plan:   Post-operative Plan:   Informed Consent: I have reviewed the patients History and Physical, chart, labs and discussed the procedure including the risks, benefits and alternatives for the proposed anesthesia with the patient or authorized representative who has indicated his/her understanding and acceptance.       Plan Discussed with:   Anesthesia Plan Comments: (To OR for postpartum BTL)        Anesthesia Quick Evaluation

## 2019-01-14 NOTE — Lactation Note (Signed)
This note was copied from a baby's chart. Lactation Consultation Note  Patient Name: Nancy Henson Today's Date: 01/14/2019 Reason for consult: Initial assessment   Mom has never bf any of her previous children but states she desires to bf because this is her last child.  LC reviewed hand exp. Offered to collect in containers due to ease of colostrum flow.  Mom prefers to not use her hands to collect any but only to express some prior to latch attempt.    Mom has a manual pump at home.  She has 2.5 ounces of EBM in freezer at home.  She began collected the last two weeks of pregnancy.    LC asked mom about her goals for breastfeeding and encouraged mom to bf and work on latch when in the hospital.  Mom wants to work on latch on right breast.    LC offered to assist with feed.  Football position used for added head and neck support, but infant would not suck after achieving latch due to sleepiness.  STS discussed, and other bf basics.  8-12+feeds in 24 hours, waking techniques and feeding cues.    Mom placed infant STS and LC shared Lactation brochure, OP services, support group info, and phone line service.  Dad very involved in discussion.    Mom will call out for further assistance with next feed.  Maternal Data Has patient been taught Hand Expression?: Yes Does the patient have breastfeeding experience prior to this delivery?: No  Feeding Feeding Type: Breast Fed  LATCH Score Latch: Too sleepy or reluctant, no latch achieved, no sucking elicited.  Audible Swallowing: None  Type of Nipple: Everted at rest and after stimulation  Comfort (Breast/Nipple): Soft / non-tender  Hold (Positioning): Assistance needed to correctly position infant at breast and maintain latch.  LATCH Score: 5  Interventions Interventions: Breast feeding basics reviewed;Assisted with latch;Skin to skin;Breast massage;Hand express;Support pillows  Lactation Tools Discussed/Used      Consult Status Consult Status: Follow-up Date: 01/15/19 Follow-up type: In-patient    Maryruth Hancock [facility] 01/14/2019, 3:06 PM

## 2019-01-14 NOTE — Anesthesia Preprocedure Evaluation (Signed)
Anesthesia Evaluation  Patient identified by MRN, date of birth, ID band Patient awake    Reviewed: Allergy & Precautions, Patient's Chart, lab work & pertinent test results  Airway Mallampati: II  TM Distance: >3 FB Neck ROM: Full    Dental no notable dental hx. (+) Teeth Intact   Pulmonary Current Smoker,    Pulmonary exam normal breath sounds clear to auscultation       Cardiovascular negative cardio ROS Normal cardiovascular exam Rhythm:Regular Rate:Normal     Neuro/Psych  Headaches, PSYCHIATRIC DISORDERS Anxiety Depression    GI/Hepatic GERD  Controlled,(+)     substance abuse  , Hx drug abuse- cocaine- not currently   Endo/Other  negative endocrine ROS  Renal/GU negative Renal ROS  negative genitourinary   Musculoskeletal negative musculoskeletal ROS (+)   Abdominal   Peds  Hematology  (+) anemia ,   Anesthesia Other Findings   Reproductive/Obstetrics (+) Pregnancy                             Anesthesia Physical  Anesthesia Plan  ASA: II and emergent  Anesthesia Plan: Epidural   Post-op Pain Management:    Induction:   PONV Risk Score and Plan:   Airway Management Planned: Natural Airway  Additional Equipment:   Intra-op Plan:   Post-operative Plan:   Informed Consent: I have reviewed the patients History and Physical, chart, labs and discussed the procedure including the risks, benefits and alternatives for the proposed anesthesia with the patient or authorized representative who has indicated his/her understanding and acceptance.       Plan Discussed with:   Anesthesia Plan Comments:         Anesthesia Quick Evaluation

## 2019-01-14 NOTE — Anesthesia Postprocedure Evaluation (Signed)
Anesthesia Post Note  Patient: Nancy Henson  Procedure(s) Performed: POST PARTUM TUBAL LIGATION (N/A )     Patient location during evaluation: PACU Anesthesia Type: Epidural Level of consciousness: oriented and awake and alert Pain management: pain level controlled Vital Signs Assessment: post-procedure vital signs reviewed and stable Respiratory status: spontaneous breathing, respiratory function stable and nonlabored ventilation Cardiovascular status: blood pressure returned to baseline and stable Postop Assessment: no headache, no backache, no apparent nausea or vomiting and epidural receding Anesthetic complications: no    Last Vitals:  Vitals:   01/14/19 1315 01/14/19 1415  BP:  108/71  Pulse: 75   Resp: 16 20  Temp:  37.2 C  SpO2: 100% 100%    Last Pain:  Vitals:   01/14/19 1415  TempSrc: Oral  PainSc:    Pain Goal:                   Lucretia Kern

## 2019-01-14 NOTE — Discharge Summary (Addendum)
Postpartum Discharge Summary   Patient Name: Nancy Henson DOB: 17-Jul-1995 MRN: [id number]  Date of admission: 01/14/2019 Delivering Provider: Zettie Cooley E   Date of discharge: 01/16/2019  Admitting diagnosis: Intrauterine pregnancy [Z34.90] Intrauterine pregnancy: [redacted]w[redacted]d    Secondary diagnosis:  Active Problems:   Intrauterine pregnancy  Additional problems: None     Discharge diagnosis: Term Pregnancy Delivered                                                                                                Post partum procedures:postpartum tubal ligation - salpingectomy  Augmentation: AROM  Complications: None  Hospital course:  Onset of Labor With Vaginal Delivery     24y.o. yo [phone] admitted in Active Labor on 01/14/2019. Patient had an uncomplicated labor course as follows:   She was admitted in active labor. She received an epidural and progressed without augmentation. She was AROMed and had a NSVD shortly after.  Membrane Rupture Time/Date: 7:24 AM ,01/14/2019   Intrapartum Procedures: Episiotomy: None [1]                                         Lacerations:  None [1]  Patient had a delivery of a Viable infant. 01/14/2019  Information for the patient's newborn:  EsGenesys, [id number][[id number]]Delivery Method: Vaginal, Spontaneous(Filed from Delivery Summary)     Pateint had an uncomplicated postpartum course.  She is ambulating, tolerating a regular diet, passing flatus, and urinating well. Patient is discharged home in stable condition on 01/16/19.  Delivery time: 7:27 AM    Magnesium Sulfate received: No BMZ received: No Rhophylac:N/A MMR:N/A Transfusion:No  Physical exam  Vitals:   01/15/19 0927 01/15/19 1455 01/15/19 2215 01/16/19 0518  BP: 111/64 118/72 109/64 112/71  Pulse: 70 70 75 68  Resp:  _0 Temp: 98.1 F (36.7 C) 98.6 F (37 C) 98.3 F (36.8 C) 98.1 F (36.7 C)  TempSrc: Oral Oral Oral Oral  SpO2:  97% 100%    Weight:      Height:       General: alert, cooperative and no distress Lochia: appropriate Uterine Fundus: firm Incision: Healing well with no significant drainage, Dressing is clean, dry, and intact DVT Evaluation: No evidence of DVT seen on physical exam. Labs: Lab Results  Component Value Date   WBC 11.2 (H) 01/14/2019   HGB 12.6 01/14/2019   HCT 37.9 01/14/2019   MCV 93.3 01/14/2019   PLT 225 01/14/2019   CMP Latest Ref Rng & Units 03/24/2018  Glucose 70 - 99 mg/dL 97  BUN 6 - 20 mg/dL 13  Creatinine 0.44 - 1.00 mg/dL 0.54  Sodium 135 - 145 mmol/L 137  Potassium 3.5 - 5.1 mmol/L 4.0  Chloride 98 - 111 mmol/L 104  CO2 22 - 32 mmol/L 25  Calcium 8.9 - 10.3 mg/dL 8.8(L)  Total Protein 6.5 - 8.1 g/dL 7.5  Total Bilirubin 0.3 - 1.2 mg/dL 0.6  Alkaline  Phos 38 - 126 U/L 80  AST 15 - 41 U/L 18  ALT 0 - 44 U/L 24    Discharge instruction: per After Visit Summary and "Baby and Me Booklet".  After visit meds:  Allergies as of 01/16/2019   No Known Allergies      Medication List     TAKE these medications    acetaminophen 325 MG tablet Commonly known as: Tylenol Take 2 tablets (650 mg total) by mouth every 6 (six) hours as needed (for pain scale < 4).   Blood Pressure Monitoring Devi 1 Device by Does not apply route once a week. ICD 10: Z34.90   docusate sodium 100 MG capsule Commonly known as: Colace Take 1 capsule (100 mg total) by mouth 2 (two) times daily.   oxyCODONE 5 MG immediate release tablet Commonly known as: Roxicodone Take 1 tablet (5 mg total) by mouth every 8 (eight) hours as needed.   PrePLUS 27-1 MG Tabs Take 1 tablet by mouth daily.   witch hazel-glycerin pad Commonly known as: TUCKS Apply 1 application topically as needed for itching.        Diet: routine diet  Activity: Advance as tolerated. Pelvic rest for 6 weeks.   Outpatient follow up:4 weeks Follow up Appt: Future Appointments  Date Time Provider [place]   01/20/2019 10:55 AM [facility]-SCREENING [facility] None  01/28/2019 10:20 AM [place] WOC  02/13/2019  9:55 AM Sloan Leiter, MD WOC-WOCA WOC   Follow up Visit:  Please schedule this patient for Postpartum visit in: 4 weeks with the following provider: Any provider For C/S patients schedule nurse incision check in weeks 2 weeks: no Low risk pregnancy complicated by: Hx substance abuse Delivery mode:  SVD Anticipated Birth Control:  BTL done PP PP Procedures needed: Incision check  Schedule Integrated BH visit: no   Newborn Data: Live born female  Birth Weight:  2869 APGAR: 58, 9   Newborn Delivery   Birth date/time: 01/14/2019 07:27:00 Delivery type: Vaginal, Spontaneous      Baby Feeding: Breast Disposition:home with mother   01/16/2019 Wilber Oliphant, MD  GME ATTESTATION:  I saw and evaluated the patient. I agree with the findings and the plan of care as documented in the resident's note.  Merilyn Baba, DO OB Fellow, [place] for [place] 01/16/2019 8:29 AM

## 2019-01-14 NOTE — Op Note (Signed)
Nancy Henson 01/14/2019  PREOPERATIVE DIAGNOSES: Multiparity, undesired fertility  POSTOPERATIVE DIAGNOSES: Multiparity, undesired fertility  PROCEDURE:  Postpartum Bilateral Partial Salpingectomy  SURGEON: Dr. Candelaria Celeste - Primary Nancy Birkenhead, MD - Fellow   ANESTHESIA:  Epidural and local analgesia using 30 ml of 0.5% Marcaine  COMPLICATIONS:  None immediate.  ESTIMATED BLOOD LOSS: Less than 25 ml.  INDICATIONS:  24 y.o. [id number] with undesired fertility, status post vaginal delivery, desires permanent sterilization.  Other reversible forms of contraception were discussed with patient; she declines all other modalities. Risks of procedure discussed with patient including but not limited to: risk of regret, permanence of method, bleeding, infection, injury to surrounding organs and need for additional procedures.  Failure risk of 1% with increased risk of ectopic gestation if pregnancy occurs was also discussed with patient.      FINDINGS:  Normal uterus, tubes, and ovaries.  PROCEDURE DETAILS: The patient was taken to the operating room where her epidural anesthesia was dosed up to surgical level and found to be adequate.  She was then placed in the dorsal supine position and prepped and draped in sterile fashion.  After an adequate timeout was performed, attention was turned to the patient's abdomen where a small transverse skin incision was made under the umbilical fold. The incision was taken down to the layer of fascia using the scalpel, and fascia was incised, and extended bilaterally using Mayo scissors. The peritoneum was entered in a sharp fashion. Attention was then turned to the patient's uterus, the right fallopian tube was grasped with Babcock clamps and followed out to the fimbriated end. The distal 5 cm portion of the tube and mesosalpinx was doubly clamped using Kelly clamps and the distal portion of the tube removed using Metzenbaum scissors. The remaining pedicle was  tied off using a 2-0 Vicryl free tie and a subsequent suture ligation with 2-0 Vicryl. The pedicle was inspected and found to be hemostatic.  A similar process was carried out on the left side allowing for bilateral tubal sterilization.  Good hemostasis was noted overall. The instruments were then removed from the patient's abdomen and the fascial incision was repaired with 0 Vicryl, and the skin was closed with a 4-0 Vicryl subcuticular stitch. The patient tolerated the procedure well.  Instrument, sponge, and needle counts were correct times two.  The patient was then taken to the recovery room awake and in stable condition.  Nancy Birkenhead, MD [facility] Fellow, [facility] for [employer], [facility]

## 2019-01-14 NOTE — Discharge Instructions (Signed)

## 2019-01-14 NOTE — Progress Notes (Addendum)
Nancy Henson is a 24 y.o. G4P3003 at [redacted]w[redacted]d  Subjective: Comfortable with epidural.  Objective: BP (!) 101/53   Pulse 82   Temp 98.1 F (36.7 C) (Oral)   Resp 18   Ht 5\' 3"  (1.6 m)   Wt 80.6 kg   LMP 04/10/2018   SpO2 99%   BMI 31.48 kg/m  No intake/output data recorded. No intake/output data recorded.  FHT:  FHR: 135 bpm, variability: moderate,  accelerations:  Abscent,  decelerations:  Present early UC:   regular, every 2-4 minutes SVE:   Dilation: 6.5 Effacement (%): 90 Station: 0, -1 Exam by:: B McClam, RN   Labs: Lab Results  Component Value Date   WBC 11.2 (H) 01/14/2019   HGB 12.6 01/14/2019   HCT 37.9 01/14/2019   MCV 93.3 01/14/2019   PLT 225 01/14/2019    Assessment / Plan: Spontaneous labor, progressing normally  Labor: Progressing normally Fetal Wellbeing:  Category I Pain Control:  Epidural I/D:  n/a Anticipated MOD:  NSVD  03/14/2019 01/14/2019, 7:02 AM  GME ATTESTATION:  I saw and evaluated the patient. I agree with the findings and the plan of care as documented in the resident's note.  03/14/2019, DO OB Fellow, Faculty [facility], Center for [facility] 01/14/2019 7:55 AM

## 2019-01-14 NOTE — Anesthesia Postprocedure Evaluation (Signed)
Anesthesia Post Note  Patient: Nancy Henson  Procedure(s) Performed: AN AD HOC LABOR EPIDURAL     Patient location during evaluation: PACU Anesthesia Type: Epidural Level of consciousness: oriented and awake and alert Pain management: pain level controlled Vital Signs Assessment: post-procedure vital signs reviewed and stable Respiratory status: spontaneous breathing, respiratory function stable and nonlabored ventilation Cardiovascular status: blood pressure returned to baseline and stable Postop Assessment: no headache, no backache, no apparent nausea or vomiting and epidural receding Anesthetic complications: no    Last Vitals:  Vitals:   01/14/19 1315 01/14/19 1415  BP:  108/71  Pulse: 75   Resp: 16 20  Temp:  37.2 C  SpO2: 100% 100%    Last Pain:  Vitals:   01/14/19 1415  TempSrc: Oral  PainSc: 5    Pain Goal:                   Lucretia Kern

## 2019-01-14 NOTE — Progress Notes (Signed)
Patient ID: Nancy Henson, female   DOB: 17-Oct-1995, 24 y.o.   MRN: [id number]  Risks of procedure discussed with patient including but not limited to: risk of regret, permanence of method, bleeding, infection, injury to surrounding organs and need for additional procedures.  Failure risk of 1 -2 % with increased risk of ectopic gestation if pregnancy occurs was also discussed with patient.    Levie Heritage, DO 01/14/2019 9:51 AM

## 2019-01-14 NOTE — Lactation Note (Signed)
This note was copied from a baby's chart. Lactation Consultation Note  Patient Name: Nancy Henson EQAST'M Date: 01/14/2019 Reason for consult: Follow-up assessment;Term;1st time breastfeeding  P4 mother whose infant is now 2 hours old.  Mother did not breast feed any of her other children.  Mother's feeding preference is breast/bottle.  Mother requested latch assistance.  RN was in room assisting when I arrived.  She had placed baby in the football hold on the right breast but baby was not interested in sustaining a latch.  Reassured mother that this is typical behavior for a baby at this age.  Mother was leaking colostrum from the left breast while baby was attempting to latch to the right breast.  Gave father a colostrum container and asked him to collect the milk from that breast.  Baby was not latching so laid her next to mother and demonstrated hand expression for father to observe.  Had him continue doing hand expression on mother's left breast and colostrum flowed easily.  Father was able to obtain 4 mls of EBM.  Praised both parents for their collective good work in obtaining the [employer].  RN in room and provided a curved tip syringe for supplementing.  I demonstrated proper usage of the curved tip syringe and baby easily consumed all 4 mls.    Mother requested a hand pump.  Provided a manual pump with instructions for use.  #24 flange size is almost too small but mother wanted to use this size.  Asked her to assess nipple placement with every pumping session to be sure it is the correct size.  Brought in a #27 so she can increase size as needed.  Mother was easily expressing more milk.  Suggested she feed back her EBM to baby with the curved tip syringe after pumping.  Father will assist.    Mother does not have a DEBP for home use.  She is a [facility] participant but is planning on obtaining the formula package.  Informed her that she will not be eligible to obtain a DEBP if she chooses  formula.  Mother stated, "If I need a DEBP I'll just go buy one."  I acknowledged her comment and informed her that she will be allowed to take the manual pump home but it will not provided the stimulation that a DEBP would provide.  Mother verbalized understanding.  Reviewed cleaning instructions for all tools and pump.  Mother will call as needed for assistance.  RN in room and updated.  Night shift RN in to introduce herself to the patient and family.     Maternal Data    Feeding    LATCH Score                   Interventions    Lactation Tools Discussed/Used     Consult Status Consult Status: Follow-up Date: 01/15/19 Follow-up type: In-patient    Dora Sims 01/14/2019, 7:29 PM

## 2019-01-14 NOTE — H&P (Addendum)
Nancy Henson is a 24 y.o. female [phone] with IUP at [redacted]w[redacted]d by LMP c/w 6 wk [place] admitted for active labor.  Reports fetal movement. Denies vaginal bleeding and ROM. She received her prenatal care at [facility].  Support person in labor: Margo Aye  Ultrasounds 05/31/18 7wk - 1.2x0.9 cm subchorionic hemorrhage.  FHR - 285 beats per minute  08/21/18 19+0 - anatomy wnl 10/18/18 - follow up growth wnl   Prenatal History/Complications: MDD (zoloft stopped prior to pregnancy) Cocaine abuse Hx os substance abuse (last use 6 months prior to pregnancy)  # 1 - Date: 08/05/11, Sex: Female, Weight: 2920 g, GA: [redacted]w[redacted]d, Delivery: Vaginal, Spontaneous, Apgar1: 7, Apgar5: 9, Living: Living, Birth Comments: None  # 2 - Date: 10/11/13, Sex: Female, Weight: 3685 g, GA: [redacted]w[redacted]d, Delivery: Vaginal, Spontaneous, Apgar1: 9, Apgar5: 9, Living: Living, Birth Comments: None  # 3 - Date: 10/10/14, Sex: Female, Weight: 3025 g, GA: [redacted]w[redacted]d, Delivery: Vaginal, Spontaneous, Apgar1: 8, Apgar5: 9, Living: Living, Birth Comments: wnl  # 4 - Date: None, Sex: None, Weight: None, GA: None, Delivery: None, Apgar1: None, Apgar5: None, Living: None, Birth Comments: None   Past Medical History:  Diagnosis Date   Anemia    Anxiety    Depression    doing well   Headache    Ovarian cyst    Past Surgical History:  Procedure Laterality Date   NO PAST SURGERIES     Family History: family history includes Healthy in her mother. Social History:  reports that she has been smoking cigarettes. She has a 1.75 pack-year smoking history. She has never used smokeless tobacco. She reports previous alcohol use of about 12.0 - 18.0 standard drinks of alcohol per week. She reports previous drug use. Drugs: Cocaine and Marijuana.    Maternal Diabetes: No Genetic Screening: Normal Maternal Ultrasounds/Referrals: Normal Fetal Ultrasounds or other Referrals:  None Maternal Substance Abuse:  Yes:  Type: Other: hx of substance abuse, none during  pregnancy  Significant Maternal Medications:  None Significant Maternal Lab Results:  Group B Strep negative Other Comments:  None   Dilation: 4 Effacement (%): 80, 90 Station: -1 Exam by:: Suezanne Jacquet, RN Blood pressure 124/76, pulse 93, last menstrual period 04/10/2018, SpO2 99 %.   Prenatal labs: ABO, Rh: O/Positive/-- (06/12 1128) Antibody: Negative (06/12 1128) Rubella: 1.91 (06/12 1128) RPR: Non Reactive (11/02 0845)  HBsAg: Negative (06/12 1128)  HIV: Non Reactive (11/02 0845)  GBS: Negative/-- (12/14 0000)   Lab Results  Component Value Date   WBC 8.2 11/11/2018   HGB 12.2 11/11/2018   HCT 34.5 11/11/2018   MCV 89 11/11/2018   PLT 242 11/11/2018   Assessment / Plan: 24 yo G4P3003 at 39.6 EGA  here for spontaneous labor.  Labor: Expectant management. Consider AROM or pitocin if appropriate. Fetal Wellbeing:  Category I Pain Control:  Epidural I/D:  GBS negative Anticipated MOD:  NSVD   Hx of substance abuse: UDS and postpartum CSW MOC: port partum BTL  Melene Plan 01/14/2019, 2:54 AM  GME ATTESTATION:  I saw and evaluated the patient. I agree with the findings and the plan of care as documented in the resident's note.  Marlowe Alt, DO OB Fellow, Faculty [facility], Center for [facility] 01/14/2019 5:15 AM

## 2019-01-14 NOTE — Anesthesia Procedure Notes (Signed)
Epidural Patient location during procedure: OB Start time: 01/14/2019 3:51 AM End time: 01/14/2019 4:01 AM  Staffing Anesthesiologist: Lannie Fields, DO Performed: anesthesiologist   Preanesthetic Checklist Completed: patient identified, IV checked, risks and benefits discussed, monitors and equipment checked, pre-op evaluation and timeout performed  Epidural Patient position: sitting Prep: DuraPrep and site prepped and draped Patient monitoring: continuous pulse ox, blood pressure, heart rate and cardiac monitor Approach: midline Location: L3-L4 Injection technique: LOR air  Needle:  Needle type: Tuohy  Needle gauge: 17 G Needle length: 9 cm Needle insertion depth: 5 cm Catheter type: closed end flexible Catheter size: 19 Gauge Catheter at skin depth: 10 cm Test dose: negative  Assessment Sensory level: T8 Events: blood not aspirated, injection not painful, no injection resistance, no paresthesia and negative IV test  Additional Notes Patient identified. Risks/Benefits/Options discussed with patient including but not limited to bleeding, infection, nerve damage, paralysis, failed block, incomplete pain control, headache, blood pressure changes, nausea, vomiting, reactions to medication both or allergic, itching and postpartum back pain. Confirmed with bedside nurse the patient's most recent platelet count. Confirmed with patient that they are not currently taking any anticoagulation, have any bleeding history or any family history of bleeding disorders. Patient expressed understanding and wished to proceed. All questions were answered. Sterile technique was used throughout the entire procedure. Please see nursing notes for vital signs. Test dose was given through epidural catheter and negative prior to continuing to dose epidural or start infusion. Warning signs of high block given to the patient including shortness of breath, tingling/numbness in hands, complete motor block,  or any concerning symptoms with instructions to call for help. Patient was given instructions on fall risk and not to get out of bed. All questions and concerns addressed with instructions to call with any issues or inadequate analgesia.  Reason for block:procedure for pain

## 2019-01-14 NOTE — Progress Notes (Signed)
Received phone call that patient was having abdominal pain that was radiating upwards into her shoulder.  Patient reported pain as cramping.  RN denies any tenderness to palpation, fevers.  Vital signs stable.  Did not respond to oxycodone 10 mg x 1. Will dose 30 mg IV toradol injection per Dr. Salomon Mast and 80 mg simethicone.   Melene Plan, M.D.  01/14/2019 11:05 PM

## 2019-01-15 LAB — SURGICAL PATHOLOGY

## 2019-01-15 NOTE — Progress Notes (Signed)
POSTPARTUM PROGRESS NOTE  Subjective: Nancy Henson is a 24 y.o. [id number] who is PPD#1 s/p vaginal delivery at [redacted]w[redacted]d, and POD#1 from a BTL (salpingectomy).  She reports she doing well. No acute events overnight. She denies any problems with ambulating, voiding or po intake. Denies nausea or vomiting. She has not passed flatus. Pain is moderately controlled.  Lochia is appropriate.  Objective: Blood pressure (!) 98/58, pulse 64, temperature 98.4 F (36.9 C), temperature source Oral, resp. rate 18, height 5\' 3"  (1.6 m), weight 80.6 kg, last menstrual period 04/10/2018, SpO2 97 %, unknown if currently breastfeeding.  Physical Exam:  General: alert, cooperative and no distress Chest: no respiratory distress Abdomen: soft, non-tender  Uterine Fundus: firm, appropriately tender Extremities: No calf swelling or tenderness  No edema  Recent Labs    01/14/19 0312  HGB 12.6  HCT 37.9    Assessment/Plan: Nancy Henson is a 24 y.o. 30  PPD#1 s/p vaginal delivery at [redacted]w[redacted]d, and POD#1 from a BTL (salpingectomy).  Routine Postpartum Care: Doing well, pain well-controlled.  -- Continue routine care, lactation support  -- Contraception: BTL (salpingectomy) -- Feeding: breast  Dispo: Plan for discharge tomorrow.  [redacted]w[redacted]d, DO OB/GYN Fellow, [facility] for [facility]

## 2019-01-15 NOTE — Progress Notes (Signed)
CSW received consult for history of anxiety and depression. CSW met with MOB to offer support and complete assessment.    MOB sitting in bed holding infant, when CSW entered the room. CSW introduced self and explained reason for consult to which MOB expressed understanding. MOB very pleasant and engaged throughout assessment and was appropriate and attentive to infant during visit. CSW inquired about MOB's mental health history and MOB noted a history of anxiety and depression in 2018 and PPD in 2016. Per MOB, much of the PPD she felt was due to having children so close together. MOB denied any recent symptoms or concerns and reported feeling "super excited" about this baby. MOB shared she was prescribed medications but stopped them and does not feel they are needed, at this time. MOB comfortable reaching out if that changes. CSW provided education regarding the baby blues period vs. perinatal mood disorders. CSW recommended self-evaluation during the postpartum time period using the New Mom Checklist from Postpartum Progress and encouraged MOB to contact a medical professional if symptoms are noted at any time. MOB did not appear to be displaying any acute mental health symptoms and denied any current SI, HI or DV. MOB reported feeling well supported by FOB, her mother, her sister, FOB's mother and MOB's best friend.  MOB confirmed having all essential items for infant once discharged and stated infant would be sleeping in a crib once home. MOB aware of Sudden Infant Death Syndrome (SIDS) precautions and safe sleeping habits.    CSW identifies no further need for intervention and no barriers to discharge at this time.  Elijio Miles, LCSW [facility] [employer] [phone]

## 2019-01-16 ENCOUNTER — Other Ambulatory Visit: Payer: Medicaid Other

## 2019-01-16 ENCOUNTER — Encounter: Payer: Medicaid Other | Admitting: Obstetrics and Gynecology

## 2019-01-16 MED ORDER — OXYCODONE HCL 5 MG PO TABS: 5.0000 mg | ORAL_TABLET | Freq: Three times a day (TID) | ORAL | 0 refills | Status: DC | PRN

## 2019-01-16 MED ORDER — ACETAMINOPHEN 325 MG PO TABS: 650.0000 mg | ORAL_TABLET | Freq: Four times a day (QID) | ORAL | Status: DC | PRN

## 2019-01-16 NOTE — Lactation Note (Signed)
This note was copied from a baby's chart. Lactation Consultation Note:  infant is 57 hours old. Mother reports that her milk is coming in. She is unsure of what to do to drain breast. Mother reports that infant refuses the rt breast. She reports that she has tried every position to get her latched.  Mother reports that she just pumped 5 ounces from the rt breast.   Mothers breast are filling. She was taught to massage and pre-pump then attempt to latch infant.  Infant latched on with good depth to the rt breast. Infant sustained latch for more than 20 mins. Mother taught to do breast compression. Observed infant with steady pattern of suckling and swallows.   Discussed engorgement prevention and treatment . Mother was taught to massage and she was given ice packs.   Mother advised to breastfeed 8-12 or more times in 24 hours. Encouraged to continue to cue base feed and do frequent STS. Mother is active with WIC. She has an appt on January 19.  Mother has a harmony hand pump and is comfortable using the pump. She also had a DEBP at the bedside.  Mother is aware of all LC services at [facility] and in the community.    Patient Name: Girl Dereona Kolodny YOVZC'H Date: 01/16/2019 Reason for consult: Follow-up assessment   Maternal Data    Feeding Feeding Type: Breast Milk  LATCH Score                   Interventions    Lactation Tools Discussed/Used     Consult Status      Michel Bickers 01/16/2019, 12:14 PM

## 2019-01-18 ENCOUNTER — Ambulatory Visit: Payer: Self-pay

## 2019-01-18 NOTE — Lactation Note (Signed)
This note was copied from a baby's chart. Lactation Consultation Note  Patient Name: Nancy Henson Today's Date: 01/18/2019   Per Meriam Sprague, RN, Mom declines lactation visit for today.  Lurline Hare [facility] 01/18/2019, 12:10 PM

## 2019-01-18 NOTE — Lactation Note (Signed)
This note was copied from a baby's chart. Lactation Consultation Note  Patient Name: Nancy Henson Today's Date: 01/18/2019   Lactation visit attempted at 0930, but room was dark & quiet. Infant has gained 40 g over the last 24 hrs.  I spoke w/Dr. Erik Obey to tell her that SW's note did not address hx of substance abuse in 2018. Mom is a P4.  Nancy Henson [facility] 01/18/2019, 9:42 AM

## 2019-01-20 ENCOUNTER — Other Ambulatory Visit (HOSPITAL_COMMUNITY): Payer: Medicaid Other

## 2019-01-22 ENCOUNTER — Inpatient Hospital Stay (HOSPITAL_COMMUNITY): Admission: AD | Admit: 2019-01-22 | Payer: Medicaid Other | Source: Home / Self Care | Admitting: Family Medicine

## 2019-01-22 ENCOUNTER — Inpatient Hospital Stay (HOSPITAL_COMMUNITY): Payer: Medicaid Other

## 2019-01-28 ENCOUNTER — Ambulatory Visit: Payer: Medicaid Other

## 2019-02-13 ENCOUNTER — Telehealth (INDEPENDENT_AMBULATORY_CARE_PROVIDER_SITE_OTHER): Payer: Medicaid Other | Admitting: Obstetrics and Gynecology

## 2019-02-13 ENCOUNTER — Encounter: Payer: Self-pay | Admitting: Obstetrics and Gynecology

## 2019-02-13 DIAGNOSIS — Z9889 Other specified postprocedural states: Secondary | ICD-10-CM

## 2019-02-13 DIAGNOSIS — Z48816 Encounter for surgical aftercare following surgery on the genitourinary system: Secondary | ICD-10-CM

## 2019-02-13 NOTE — Progress Notes (Signed)
TELEHEALTH POSTPARTUM VIRTUAL VIDEO VISIT ENCOUNTER NOTE   Provider location: Center for [employer] at [place]   I connected with Nancy Henson on 02/13/19 at  9:55 AM EST by MyChart Video Encounter at home and verified that I am speaking with the correct person using two identifiers.    I discussed the limitations, risks, security and privacy concerns of performing an evaluation and management service virtually and the availability of in person appointments. I also discussed with the patient that there may be a patient responsible charge related to this service. The patient expressed understanding and agreed to proceed.  Chief Complaint: Postpartum Visit  History of Present Illness: Nancy Henson is a 24 y.o. African-American [id number] being evaluated for postpartum followup.    She is s/p normal spontaneous vaginal delivery on 01/14/19 at 39 weeks; she was discharged to home on PPD#2. Underwent post partum tubal ligation on PPD#0. Pregnancy complicated by n/a. Baby is doing well.  Complains of nothing. Minimal pain from incision for tubal but doing veyr well now.   Vaginal bleeding or discharge: No  Intercourse: Yes  Contraception: bilateral tubal ligation Mode of feeding infant: Bottle PP depression s/s: No .  Any bowel or bladder issues: No  Pap smear: have requested pap records from [place] [facility]: Positive for n/a. Her 12 point review of systems is negative or as noted in the History of Present Illness.  Patient Active Problem List   Diagnosis Date Noted  . Intrauterine pregnancy 01/14/2019  . Decreased fetal movements affecting management of mother, antepartum 12/24/2018  . Unwanted fertility 11/04/2018  . Supervision of high risk pregnancy, antepartum 06/06/2018  . History of substance abuse ([place]) 06/06/2018  . Major depressive disorder, recurrent severe without psychotic features ([place]) 06/19/2016  . Cocaine abuse ([place]) 06/19/2016  . Migraine  headache without aura 11/16/2014    Medications Orion K. Hannay had no medications administered during this visit. Current Outpatient Medications  Medication Sig Dispense Refill  . acetaminophen (TYLENOL) 325 MG tablet Take 2 tablets (650 mg total) by mouth every 6 (six) hours as needed (for pain scale < 4). (Patient not taking: Reported on 02/13/2019)    . docusate sodium (COLACE) 100 MG capsule Take 1 capsule (100 mg total) by mouth 2 (two) times daily. (Patient not taking: Reported on 12/24/2018) 10 capsule 0  . oxyCODONE (ROXICODONE) 5 MG immediate release tablet Take 1 tablet (5 mg total) by mouth every 8 (eight) hours as needed. (Patient not taking: Reported on 02/13/2019) 20 tablet 0   Current Facility-Administered Medications  Medication Dose Route Frequency Provider Last Rate Last Admin  . cyclobenzaprine (FLEXERIL) tablet 5 mg  5 mg Oral TID PRN Jorje Guild, NP        Allergies Patient has no known allergies.  Physical Exam:  LMP 04/10/2018  General:  Alert, oriented and cooperative. Patient is in no acute distress.  Mental Status: Normal mood and affect. Normal behavior. Normal judgment and thought content.   Respiratory: Normal respiratory effort noted, no problems with respiration noted  Rest of physical exam deferred due to type of encounter  PP Depression Screening:   Edinburgh Postnatal Depression Scale Screening Tool 02/13/2019 01/15/2019  I have been able to laugh and see the funny side of things. 0 0  I have looked forward with enjoyment to things. 0 0  I have blamed myself unnecessarily when things went wrong. 0 0  I have been anxious or worried for no  good reason. 0 0  I have felt scared or panicky for no good reason. 0 0  Things have been getting on top of me. 2 0  I have been so unhappy that I have had difficulty sleeping. 0 0  I have felt sad or miserable. 0 0  I have been so unhappy that I have been crying. 0 0  The thought of harming myself has occurred  to me. 0 0  Edinburgh Postnatal Depression Scale Total 2 0     Assessment:Patient is a 24 y.o. [id number] who is 4 weeks postpartum from a normal spontaneous vaginal delivery.  She is doing well.   Plan:  1. Postpartum state Doing well - need pap records from [facility]  2. Postoperative state S/p BTL - doing well, no issues  RTC 6 months for annual  I discussed the assessment and treatment plan with the patient. The patient was provided an opportunity to ask questions and all were answered. The patient agreed with the plan and demonstrated an understanding of the instructions.   The patient was advised to call back or seek an in-person evaluation/go to the ED for any concerning postpartum symptoms.  I provided 15 minutes of face-to-face time during this encounter.   Conan Bowens, MD Center for [facility], [facility]

## 2019-04-05 ENCOUNTER — Ambulatory Visit (HOSPITAL_COMMUNITY)
Admission: EM | Admit: 2019-04-05 | Discharge: 2019-04-05 | Disposition: A | Discharge status: Home / Self Care | Payer: Medicaid Other | Attending: Family Medicine | Admitting: Family Medicine

## 2019-04-05 ENCOUNTER — Encounter (HOSPITAL_COMMUNITY): Payer: Self-pay | Admitting: *Deleted

## 2019-04-05 ENCOUNTER — Ambulatory Visit (INDEPENDENT_AMBULATORY_CARE_PROVIDER_SITE_OTHER): Payer: Medicaid Other

## 2019-04-05 ENCOUNTER — Other Ambulatory Visit: Payer: Self-pay

## 2019-04-05 DIAGNOSIS — M25572 Pain in left ankle and joints of left foot: Secondary | ICD-10-CM | POA: Diagnosis not present

## 2019-04-05 DIAGNOSIS — S93492A Sprain of other ligament of left ankle, initial encounter: Secondary | ICD-10-CM | POA: Diagnosis not present

## 2019-04-05 MED ORDER — IBUPROFEN 800 MG PO TABS: 800.0000 mg | ORAL_TABLET | Freq: Three times a day (TID) | ORAL | 0 refills | Status: DC

## 2019-04-05 NOTE — ED Triage Notes (Signed)
Pt reports sitting yesterday with LLE underneath her other leg; states LLE "fell asleep", and when she stood up she felt a "pop" in left ankle.   Left lateral ankle swollen.  Pt ambulates with limp.  LLE CMS intact.

## 2019-04-07 NOTE — ED Provider Notes (Signed)
[place]   [id number] 04/05/19 Arrival Time: 1324  ASSESSMENT & PLAN:  1. Sprain of anterior talofibular ligament of left ankle, initial encounter     I have personally viewed the imaging studies ordered this visit. No fractures appreciated.  Will treat as sprain. Discussed.  Meds ordered this encounter  Medications  . ibuprofen (ADVIL) 800 MG tablet    Sig: Take 1 tablet (800 mg total) by mouth 3 (three) times daily with meals.    Dispense:  21 tablet    Refill:  0    Orders Placed This Encounter  Procedures  . DG Ankle Complete Left  . Apply ASO ankle    Recommend: Follow-up Information    [place].   Why: If worsening or failing to improve as anticipated. Contact information: [address] [place] [phone]           Natural history and expected course discussed. Questions answered. Rest, ice, compression, elevation (RICE) therapy. Fit with ankle brace for use over next 1 week.  Reviewed expectations re: course of current medical issues. Questions answered. Outlined signs and symptoms indicating need for more acute intervention. Patient verbalized understanding. After Visit Summary given.  SUBJECTIVE: History from: patient. Nancy Henson is a 24 y.o. female who reports persistent mild to moderate pain of her left ankle; described as aching; without radiation. Onset: abrupt. First noted: yesterday. Injury/trama: reports feeling "a pop" when rising from sitting position; pain followed; no direct ankle trauma. Symptoms have been basically asymptomatic since beginning. Aggravating factors: certain movements and weight bearing. Alleviating factors: have not been identified. Associated symptoms: none reported. Extremity sensation changes or weakness: none. Self treatment: has not tried OTC therapies.  History of similar: no.  Past Surgical History:  Procedure  Laterality Date  . TUBAL LIGATION N/A 01/14/2019   Procedure: POST PARTUM TUBAL LIGATION;  Surgeon: Truett Mainland, DO;  Location: [facility];  Service: Gynecology;  Laterality: N/A;      OBJECTIVE:  Vitals:   04/05/19 1403  BP: 124/90  Pulse: 63  Resp: 16  Temp: 98.2 F (36.8 C)  TempSrc: Oral  SpO2: 99%    General appearance: alert; no distress HEENT: [place]; AT Neck: supple with FROM Resp: unlabored respirations Extremities: . LLE: warm with well perfused appearance; fairly well localized mild to moderate tenderness over left ankle, ATFL distribution; without gross deformities; swelling: minimal; bruising: none; ankle ROM: normal, with discomfort CV: brisk extremity capillary refill of LLE; 2+ DP pulse of LLE. Skin: warm and dry; no visible rashes Neurologic: gait normal but favors LLE; normal sensation and strength of LLE Psychological: alert and cooperative; normal mood and affect  Imaging: DG Ankle Complete Left  Result Date: 04/05/2019 CLINICAL DATA:  Pain EXAM: LEFT ANKLE COMPLETE - 3+ VIEW COMPARISON:  None. FINDINGS: There is soft tissue swelling about the lateral malleolus without evidence for an underlying displaced fracture or dislocation. There is no radiopaque foreign body. IMPRESSION: Soft tissue swelling about the lateral malleolus without evidence for an underlying displaced fracture or dislocation. Electronically Signed   By: Constance Holster M.D.   On: 04/05/2019 15:27     No Known Allergies  Past Medical History:  Diagnosis Date  . Anemia   . Anxiety   . Depression    doing well  . Headache   . Ovarian cyst    Social History   Socioeconomic History  . Marital status: Married  Spouse name: Not on file  . Number of children: Not on file  . Years of education: Not on file  . Highest education level: Not on file  Occupational History  . Not on file  Tobacco Use  . Smoking status: Current Every Day Smoker    Packs/day: 0.25    Years: 7.00     Pack years: 1.75    Types: Cigarettes  . Smokeless tobacco: Never Used  . Tobacco comment: refused  Substance and Sexual Activity  . Alcohol use: Yes    Alcohol/week: 2.0 standard drinks    Types: 2 Cans of beer per week  . Drug use: Not Currently    Types: Cocaine, Marijuana  . Sexual activity: Yes    Birth control/protection: Surgical  Other Topics Concern  . Not on file  Social History Narrative  . Not on file   Social Determinants of Health   Financial Resource Strain:   . Difficulty of Paying Living Expenses:   Food Insecurity: No Food Insecurity  . Worried About Programme researcher, broadcasting/film/video in the Last Year: Never true  . Ran Out of Food in the Last Year: Never true  Transportation Needs: No Transportation Needs  . Lack of Transportation (Medical): No  . Lack of Transportation (Non-Medical): No  Physical Activity:   . Days of Exercise per Week:   . Minutes of Exercise per Session:   Stress:   . Feeling of Stress :   Social Connections:   . Frequency of Communication with Friends and Family:   . Frequency of Social Gatherings with Friends and Family:   . Attends Religious Services:   . Active Member of Clubs or Organizations:   . Attends Banker Meetings:   Marland Kitchen Marital Status:    Family History  Problem Relation Age of Onset  . Healthy Mother   . Alcohol abuse Neg Hx   . Arthritis Neg Hx   . Asthma Neg Hx   . Birth defects Neg Hx   . Cancer Neg Hx   . COPD Neg Hx   . Depression Neg Hx   . Diabetes Neg Hx   . Drug abuse Neg Hx   . Early death Neg Hx   . Hearing loss Neg Hx   . Heart disease Neg Hx   . Hyperlipidemia Neg Hx   . Hypertension Neg Hx   . Kidney disease Neg Hx   . Learning disabilities Neg Hx   . Mental illness Neg Hx   . Mental retardation Neg Hx   . Miscarriages / Stillbirths Neg Hx   . Stroke Neg Hx   . Vision loss Neg Hx   . Varicose Veins Neg Hx    Past Surgical History:  Procedure Laterality Date  . TUBAL LIGATION N/A  01/14/2019   Procedure: POST PARTUM TUBAL LIGATION;  Surgeon: Levie Heritage, DO;  Location: [facility];  Service: Gynecology;  Laterality: N/AMardella Layman, MD 04/07/19 [phone]

## 2019-10-13 ENCOUNTER — Ambulatory Visit (HOSPITAL_COMMUNITY)
Admission: EM | Admit: 2019-10-13 | Discharge: 2019-10-13 | Disposition: A | Discharge status: Home / Self Care | Payer: Medicaid Other | Attending: Urgent Care | Admitting: Urgent Care

## 2019-10-13 ENCOUNTER — Encounter (HOSPITAL_COMMUNITY): Payer: Self-pay

## 2019-10-13 DIAGNOSIS — R059 Cough, unspecified: Secondary | ICD-10-CM | POA: Diagnosis present

## 2019-10-13 DIAGNOSIS — J069 Acute upper respiratory infection, unspecified: Secondary | ICD-10-CM | POA: Diagnosis not present

## 2019-10-13 DIAGNOSIS — Z20822 Contact with and (suspected) exposure to covid-19: Secondary | ICD-10-CM | POA: Diagnosis not present

## 2019-10-13 DIAGNOSIS — F17210 Nicotine dependence, cigarettes, uncomplicated: Secondary | ICD-10-CM | POA: Diagnosis not present

## 2019-10-13 DIAGNOSIS — N898 Other specified noninflammatory disorders of vagina: Secondary | ICD-10-CM | POA: Diagnosis not present

## 2019-10-13 LAB — SARS CORONAVIRUS 2 (TAT 6-24 HRS): SARS Coronavirus 2: NEGATIVE

## 2019-10-13 MED ORDER — FLUCONAZOLE 150 MG PO TABS: 150.0000 mg | ORAL_TABLET | Freq: Once | ORAL | 0 refills | Status: AC

## 2019-10-13 NOTE — ED Provider Notes (Signed)
[facility]    CSN: [id number] Arrival date & time: 10/13/19  0160      History   Chief Complaint Chief Complaint  Patient presents with  . Nasal Congestion  . Cough  . Sore Throat    HPI Nancy Henson is a 24 y.o. female.   Patient presenting today with 1 day hx of congestion, cough, sore throat, diarrhea. Denies fever, chills, N/V, CP, SOB, wheezing. Not taking anything OTC for sxs. Multiple family members sick with similar sxs. No known hx of pulmonary dz or immune compromise.  Notes she tends to get yeast infections when sick and feels one coming on now with vaginal itching and discharge.      Past Medical History:  Diagnosis Date  . Anemia   . Anxiety   . Depression    doing well  . Headache   . Ovarian cyst     Patient Active Problem List   Diagnosis Date Noted  . Intrauterine pregnancy 01/14/2019  . Decreased fetal movements affecting management of mother, antepartum 12/24/2018  . Unwanted fertility 11/04/2018  . Supervision of high risk pregnancy, antepartum 06/06/2018  . History of substance abuse (HCC) 06/06/2018  . 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 Cocaine abuse (HCC) 06/19/2016  . Migraine headache without aura 11/16/2014    Past Surgical History:  Procedure Laterality Date  . TUBAL LIGATION N/A 01/14/2019   Procedure: POST PARTUM TUBAL LIGATION;  Surgeon: Levie Heritage, DO;  Location: [facility];  Service: Gynecology;  Laterality: N/A;    OB History    Gravida  4   Para  4   Term  4   Preterm  0   AB  0   Living  4     SAB  0   TAB  0   Ectopic  0   Multiple  0   Live Births  4            Home Medications    Prior to Admission medications   Medication Sig Start Date End Date Taking? Authorizing Provider  fluconazole (DIFLUCAN) 150 MG tablet Take 1 tablet (150 mg total) by mouth once for 1 dose. 10/13/19 10/13/19  Particia Nearing, PA-C    ibuprofen (ADVIL) 800 MG tablet Take 1 tablet (800 mg total) by mouth 3 (three) times daily with meals. 04/05/19   Mardella Layman, MD    Family History Family History  Problem Relation Age of Onset  . Healthy Mother   . Alcohol abuse Neg Hx   . Arthritis Neg Hx   . Asthma Neg Hx   . Birth defects Neg Hx   . Cancer Neg Hx   . COPD Neg Hx   . Depression Neg Hx   . Diabetes Neg Hx   . Drug abuse Neg Hx   . Early death Neg Hx   . Hearing loss Neg Hx   . Heart disease Neg Hx   . Hyperlipidemia Neg Hx   . Hypertension Neg Hx   . Kidney disease Neg Hx   . Learning disabilities Neg Hx   . Mental illness Neg Hx   . Mental retardation Neg Hx   . Miscarriages / Stillbirths Neg Hx   . Stroke Neg Hx   . Vision loss Neg Hx   . Varicose Veins Neg Hx     Social History Social History   Tobacco Use  . Smoking status: Current Every Day  Smoker    Packs/day: 0.25    Years: 7.00    Pack years: 1.75    Types: Cigarettes  . Smokeless tobacco: Never Used  . Tobacco comment: refused  Vaping Use  . Vaping Use: Never used  Substance Use Topics  . Alcohol use: Yes    Alcohol/week: 2.0 standard drinks    Types: 2 Cans of beer per week  . Drug use: Not Currently    Types: Cocaine, Marijuana     Allergies   Patient has no known allergies.   Review of Systems Review of Systems PER HPI    Physical Exam Triage Vital Signs ED Triage Vitals  Enc Vitals Group     BP 10/13/19 1053 135/65     Pulse Rate 10/13/19 1053 61     Resp 10/13/19 1053 16     Temp 10/13/19 1053 98.3 F (36.8 C)     Temp Source 10/13/19 1053 Oral     SpO2 10/13/19 1053 99 %     Weight --      Height --      Head Circumference --      Peak Flow --      Pain Score 10/13/19 1052 5     Pain Loc --      Pain Edu? --      Excl. in GC? --    No data found.  Updated Vital Signs BP 135/65 (BP Location: Left Arm)   Pulse 61   Temp 98.3 F (36.8 C) (Oral)   Resp 16   SpO2 99%   Visual Acuity Right  Eye Distance:   Left Eye Distance:   Bilateral Distance:    Right Eye Near:   Left Eye Near:    Bilateral Near:     Physical Exam Vitals and nursing note reviewed.  Constitutional:      Appearance: Normal appearance. She is not ill-appearing.  HENT:     Head: Atraumatic.     Right Ear: Tympanic membrane normal.     Left Ear: Tympanic membrane normal.     Nose: Nose normal. No congestion.     Mouth/Throat:     Mouth: Mucous membranes are moist.     Pharynx: Posterior oropharyngeal erythema present.  Eyes:     Extraocular Movements: Extraocular movements intact.     Conjunctiva/sclera: Conjunctivae normal.  Cardiovascular:     Rate and Rhythm: Normal rate and regular rhythm.     Heart sounds: Normal heart sounds.  Pulmonary:     Effort: Pulmonary effort is normal.     Breath sounds: Normal breath sounds.  Abdominal:     General: Bowel sounds are normal. There is no distension.     Palpations: Abdomen is soft.     Tenderness: There is no abdominal tenderness. There is no guarding.  Musculoskeletal:        General: Normal range of motion.     Cervical back: Normal range of motion and neck supple.  Skin:    General: Skin is warm and dry.  Neurological:     Mental Status: She is alert and oriented to person, place, and time.  Psychiatric:        Mood and Affect: Mood normal.        Thought Content: Thought content normal.        Judgment: Judgment normal.      UC Treatments / Results  Labs (all labs ordered are listed, but only abnormal results are displayed) Labs Reviewed  SARS CORONAVIRUS 2 (TAT 6-24 HRS)    EKG   Radiology No results found.  Procedures Procedures (including critical care time)  Medications Ordered in UC Medications - No data to display  Initial Impression / Assessment and Plan / UC Course  I have reviewed the triage vital signs and the nursing notes.  Pertinent labs & imaging results that were available during my care of the patient  were reviewed by me and considered in my medical decision making (see chart for details).     Overall well appearing, exam and vital signs reassuring today. Will test for COVID and isolate until results are back. Discussed good OTC medications and supportive home care. Will rx one diflucan tab in case developing yeast infection. She declines vaginal swab today. Return precautions reviewed.    Final Clinical Impressions(s) / UC Diagnoses   Final diagnoses:  Viral URI with cough   Discharge Instructions   None    ED Prescriptions    Medication Sig Dispense Auth. Provider   fluconazole (DIFLUCAN) 150 MG tablet Take 1 tablet (150 mg total) by mouth once for 1 dose. 1 tablet Particia Nearing, [place]     PDMP not reviewed this encounter.   Particia Nearing, [place] 10/13/19 1144

## 2019-10-13 NOTE — ED Triage Notes (Signed)
Pt presents with nasal congestion, cough and sore throat since this morning. Denies fever, sob, diarrhea.

## 2019-12-16 ENCOUNTER — Encounter: Payer: Self-pay | Admitting: General Practice

## 2019-12-23 IMAGING — US TRANSVAGINAL OB ULTRASOUND
1 of 2 series · 13 of 28 positions shown · non-contrast
Comparison: None.
COMPARISON: None.

Addendum:
CLINICAL DATA: Pelvic pain

EXAM:
OBSTETRIC <14 WK US AND TRANSVAGINAL OB US
TECHNIQUE: Both transabdominal and transvaginal ultrasound examinations were
performed for complete evaluation of the gestation as well as the
maternal uterus, adnexal regions, and pelvic cul-de-sac.
Transvaginal technique was performed to assess early pregnancy.

[Series 1: transvaginal ob ultrasound · 13 of 116 slices shown]
[im 5/116]
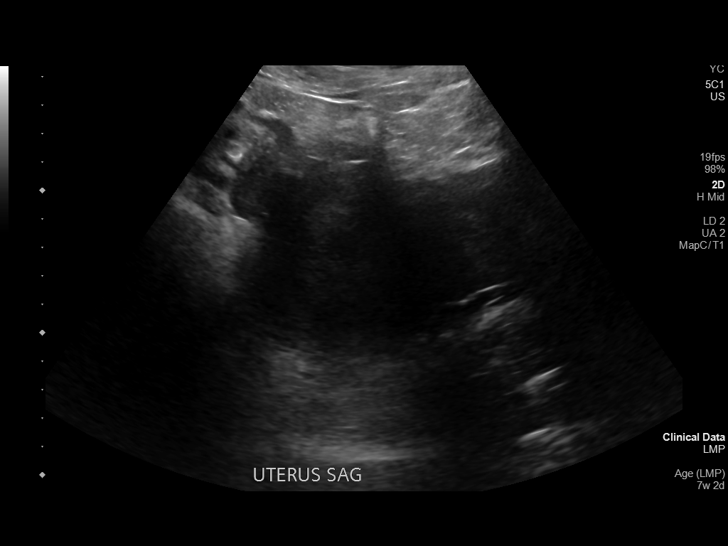
[im 14/116]
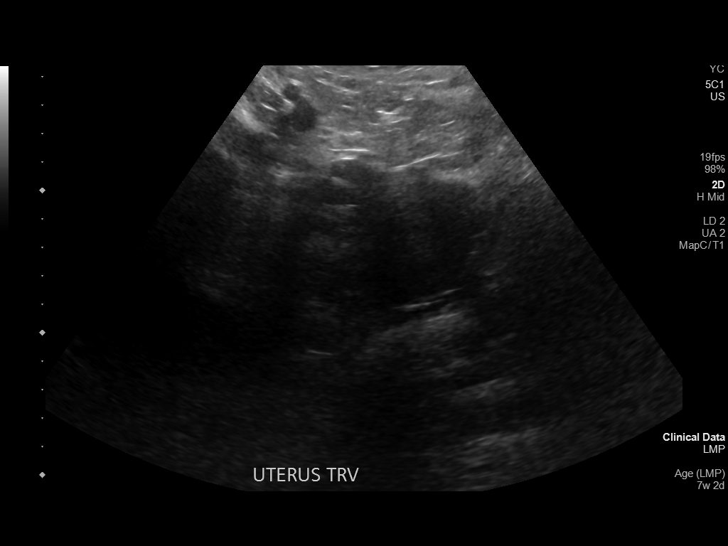
[im 23/116]
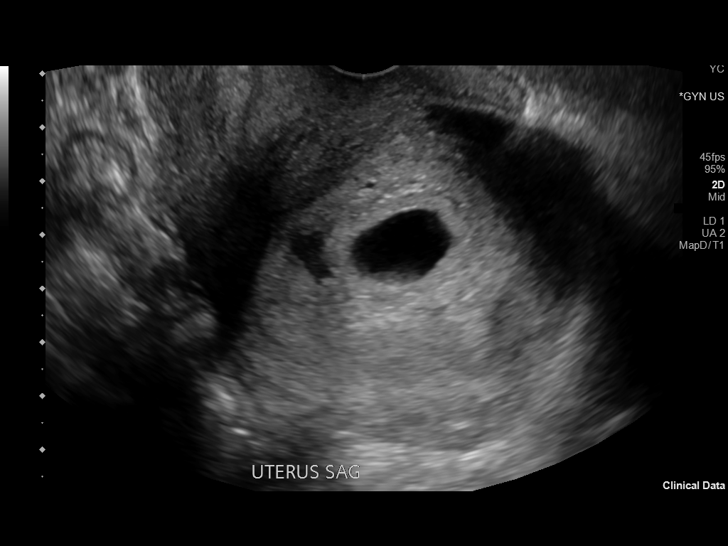
[im 31/116]
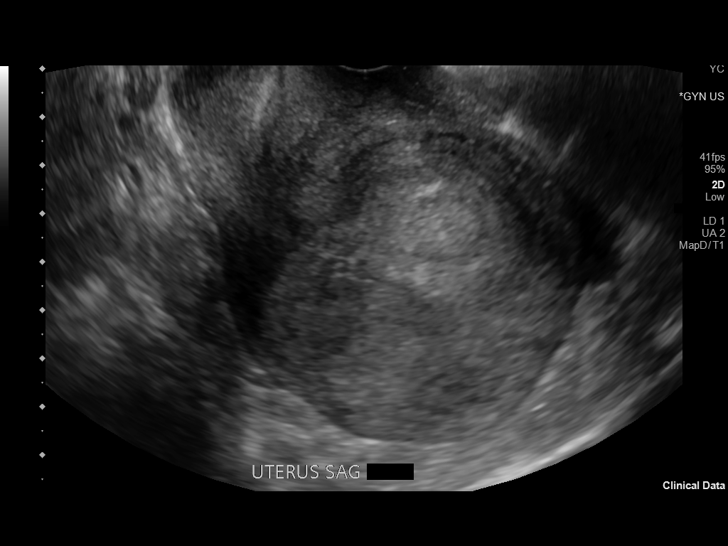
[im 40/116]
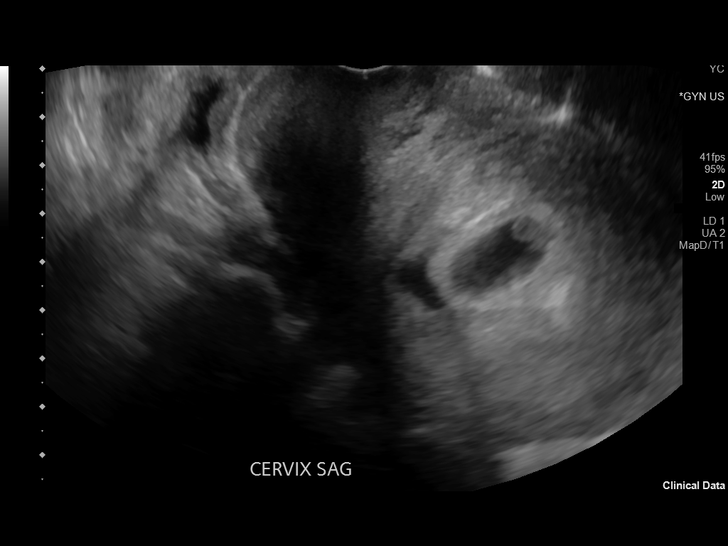
[im 49/116]
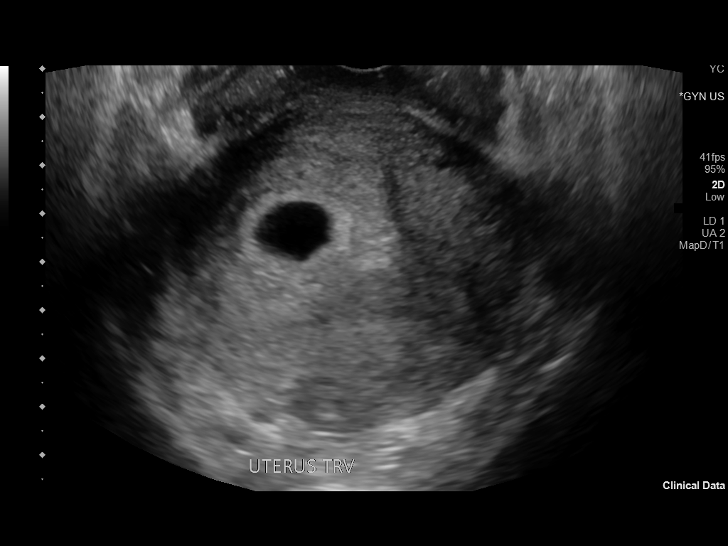
[im 62/116]
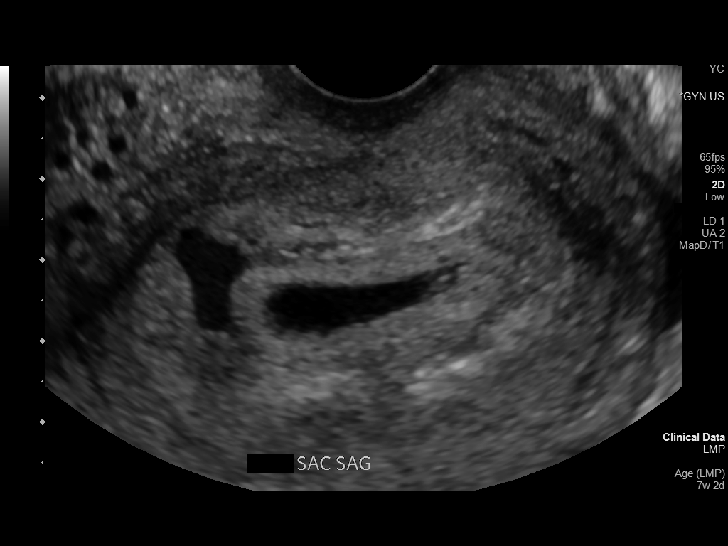
[im 71/116]
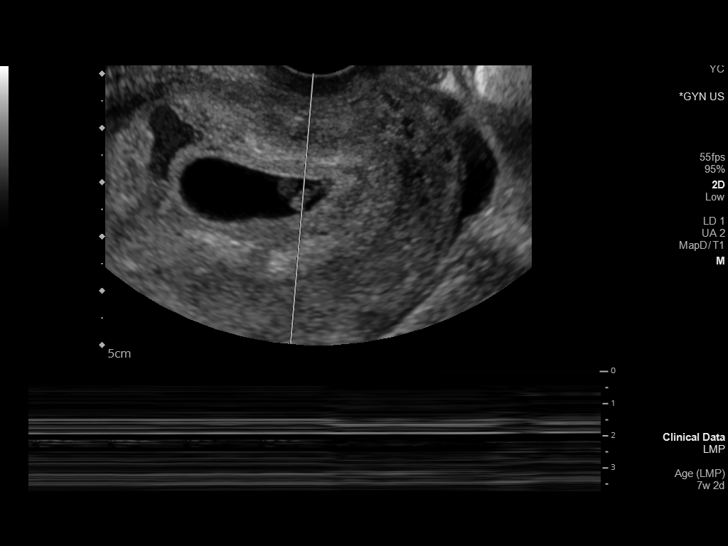
[im 80/116]
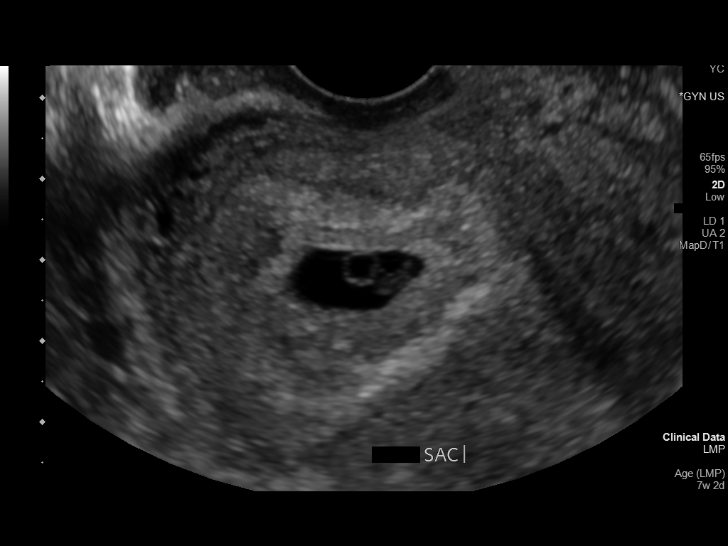
[im 89/116]
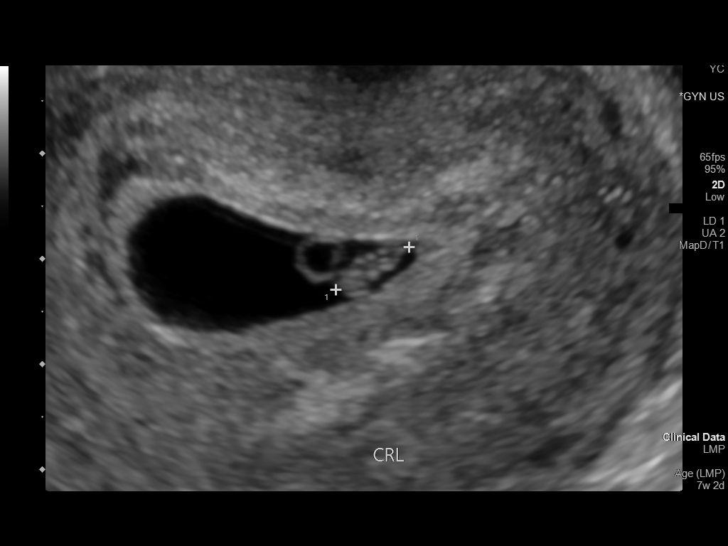
[im 98/116]
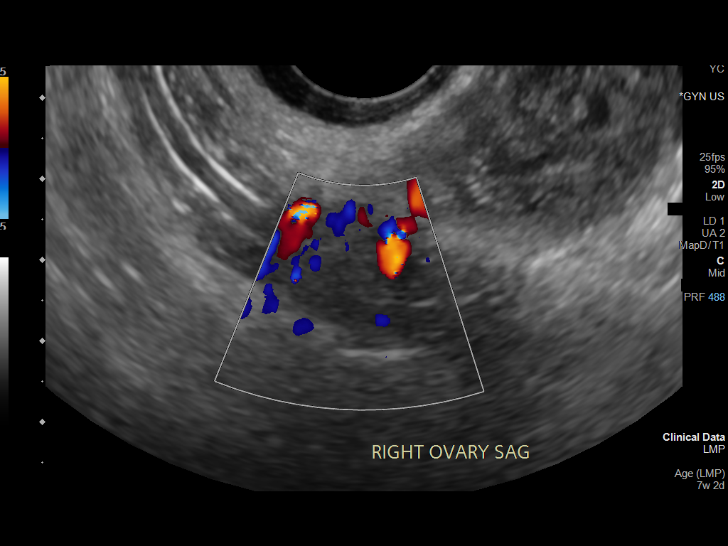
[im 107/116]
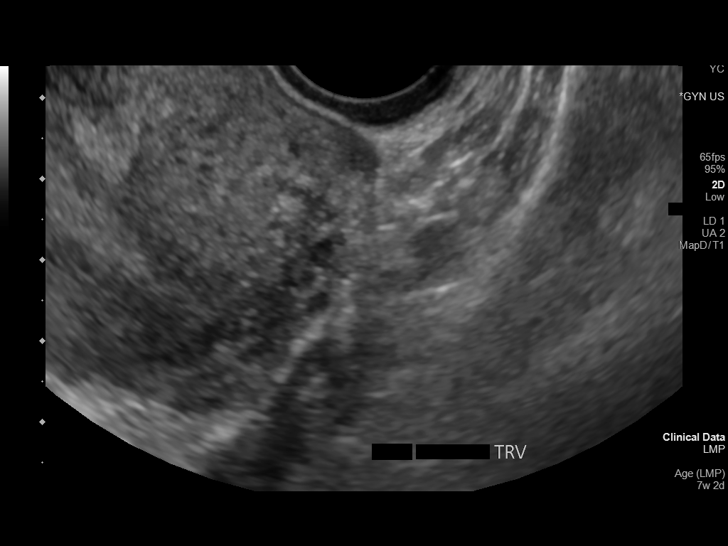
[im 116/116]
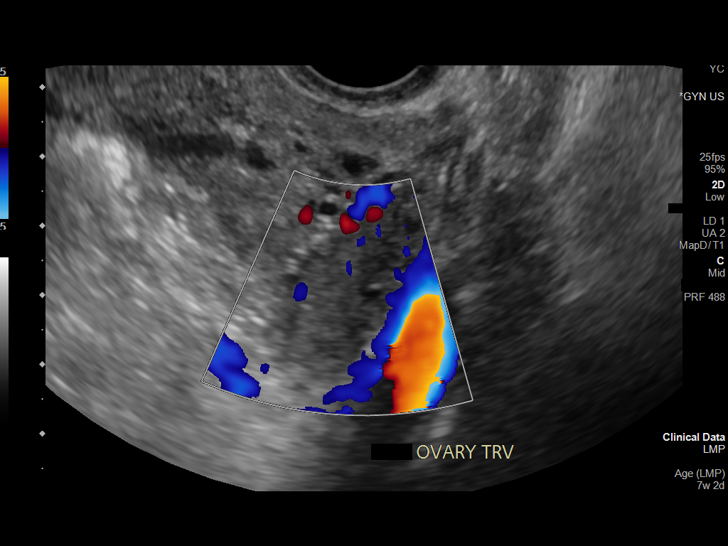

[13 of 28 positions shown; findings below may reference images not displayed]

FINDINGS: Intrauterine gestational sac: Visualized

Yolk sac:  Visualized

Embryo: Visualized

Cardiac Activity: Visualized

Heart Rate: 285 bpm

CRL:  8 mm   6 w   5 d                  US EDC: November 19, 2019

Subchorionic hemorrhage: There is a 1.2 x 0.9 cm in size
subchorionic hemorrhage.

Maternal uterus/adnexae: Left ovary measures 2.7 x 2.6 x 1.7 cm.
Right ovary measures 2.1 x 1.5 x 1.4 cm. There is no extrauterine
pelvic or adnexal mass. There is a small amount of free pelvic
fluid.
IMPRESSION: Single live intrauterine gestation with estimated gestational age of
7-weeks. Measured heart rate is 285 beats per minute which is
unusually rapid. Etiology for tachycardia in a fetus of this age is
uncertain. Close clinical and imaging surveillance advised in this
regard.

There is a 1.2 x 0.9 cm in size subchorionic hemorrhage. No
extrauterine pelvic mass evident. Small amount of free pelvic fluid
may indicate recent small ovarian cyst rupture.

ADDENDUM:
I discussed this case with the performing ultrasound technologist.
To reexaming the echocardiographic tracing, it was determined that
the actual measured fetal heart rate is 141 beats per minute which
is within the normal range.

*** End of Addendum ***
FINDINGS: Intrauterine gestational sac: Visualized

Yolk sac:  Visualized

Embryo: Visualized

Cardiac Activity: Visualized

Heart Rate: 285 bpm

CRL:  8 mm   6 w   5 d                  US EDC: November 19, 2019

Subchorionic hemorrhage: There is a 1.2 x 0.9 cm in size
subchorionic hemorrhage.

Maternal uterus/adnexae: Left ovary measures 2.7 x 2.6 x 1.7 cm.
Right ovary measures 2.1 x 1.5 x 1.4 cm. There is no extrauterine
pelvic or adnexal mass. There is a small amount of free pelvic
fluid.
IMPRESSION: Single live intrauterine gestation with estimated gestational age of
7-weeks. Measured heart rate is 285 beats per minute which is
unusually rapid. Etiology for tachycardia in a fetus of this age is
uncertain. Close clinical and imaging surveillance advised in this
regard.

There is a 1.2 x 0.9 cm in size subchorionic hemorrhage. No
extrauterine pelvic mass evident. Small amount of free pelvic fluid
may indicate recent small ovarian cyst rupture.

## 2019-12-29 ENCOUNTER — Ambulatory Visit: Payer: Medicaid Other | Admitting: Obstetrics and Gynecology

## 2020-02-16 ENCOUNTER — Telehealth: Payer: Medicaid Other | Admitting: Family Medicine

## 2020-02-16 DIAGNOSIS — N760 Acute vaginitis: Secondary | ICD-10-CM

## 2020-02-16 DIAGNOSIS — B373 Candidiasis of vulva and vagina: Secondary | ICD-10-CM | POA: Diagnosis not present

## 2020-02-16 DIAGNOSIS — B9689 Other specified bacterial agents as the cause of diseases classified elsewhere: Secondary | ICD-10-CM | POA: Diagnosis not present

## 2020-02-16 DIAGNOSIS — B3731 Acute candidiasis of vulva and vagina: Secondary | ICD-10-CM

## 2020-02-16 MED ORDER — METRONIDAZOLE 0.75 % VA GEL: 1.0000 | Freq: Every day | VAGINAL | 0 refills | Status: AC

## 2020-02-16 MED ORDER — FLUCONAZOLE 150 MG PO TABS: 150.0000 mg | ORAL_TABLET | Freq: Once | ORAL | 0 refills | Status: AC

## 2020-02-16 MED ORDER — METRONIDAZOLE 0.75 % EX GEL: 1.0000 "application " | Freq: Every day | CUTANEOUS | 0 refills | Status: AC

## 2020-02-16 NOTE — Progress Notes (Signed)
Nancy Henson, Nancy Henson are scheduled for a virtual visit with your provider today.    Just as we do with appointments in the office, we must obtain your consent to participate.  Your consent will be active for this visit and any virtual visit you may have with one of our providers in the next 365 days.    If you have a MyChart account, I can also send a copy of this consent to you electronically.  All virtual visits are billed to your insurance company just like a traditional visit in the office.  As this is a virtual visit, video technology does not allow for your provider to perform a traditional examination.  This may limit your provider's ability to fully assess your condition.  If your provider identifies any concerns that need to be evaluated in person or the need to arrange testing such as labs, EKG, etc, we will make arrangements to do so.    Although advances in technology are sophisticated, we cannot ensure that it will always work on either your end or our end.  If the connection with a video visit is poor, we may have to switch to a telephone visit.  With either a video or telephone visit, we are not always able to ensure that we have a secure connection.   I need to obtain your verbal consent now.   Are you willing to proceed with your visit today?   Nancy Henson has provided verbal consent on 02/16/2020 for a virtual visit (video or telephone).  Virtual Visit via Video Note  I connected with Nancy Henson on 02/16/20 at  2:00 PM EST by a video enabled telemedicine application and verified that I am speaking with the correct person using two identifiers.  Location: Patient: Home Provider: [place] [facility]   I discussed the limitations of evaluation and management by telemedicine and the availability of in person appointments. The patient expressed understanding and agreed to proceed.  History of Present Illness:  HPI Nancy Henson is a 25 year old female that  presented via telephone with complaints vaginal discharge and vaginal odor over the past several days.  Patient states that she has recurrent bacterial vaginitis and vaginal yeast infections.  Her last yeast infection was in December 2021.  Patient says that symptoms are similar to past infections.  She is not attempted any over-the-counter interventions to alleviate symptoms.  Patient is sexually active with one partner.  She states that she was recently tested for sexually transmitted infections, which was negative.  She denies any dyspareunia, vaginal bleeding, vaginal itching, urinary frequency, or urinary urgency.  Patient currently does not have a primary care provider.    Past Medical History:  Diagnosis Date  . Anemia   . Anxiety   . Depression    doing well  . Headache   . Ovarian cyst    Social History   Socioeconomic History  . Marital status: Married    Spouse name: Not on file  . Number of children: Not on file  . Years of education: Not on file  . Highest education level: Not on file  Occupational History  . Not on file  Tobacco Use  . Smoking status: Current Every Day Smoker    Packs/day: 0.25    Years: 7.00    Pack years: 1.75    Types: Cigarettes  . Smokeless tobacco: Never Used  . Tobacco comment: refused  Vaping Use  . Vaping Use: Never used  Substance  and Sexual Activity  . Alcohol use: Yes    Alcohol/week: 2.0 standard drinks    Types: 2 Cans of beer per week  . Drug use: Not Currently    Types: Cocaine, Marijuana  . Sexual activity: Yes    Birth control/protection: Surgical  Other Topics Concern  . Not on file  Social History Narrative  . Not on file   Social Determinants of Health   Financial Resource Strain: Not on file  Food Insecurity: Not on file  Transportation Needs: Not on file  Physical Activity: Not on file  Stress: Not on file  Social Connections: Not on file  Intimate Partner Violence: Not on file   Immunization History   Administered Date(s) Administered  . Influenza,inj,Quad PF,6+ Mos 10/11/2013, 10/12/2014  . Pneumococcal Polysaccharide-23 10/11/2014  . Tdap 08/04/2014, 11/04/2018   No Known Allergies Review of Systems  HENT: Negative.   Eyes: Negative.   Respiratory: Negative.   Cardiovascular: Negative.   Gastrointestinal: Negative.   Genitourinary: Negative for dysuria, flank pain, frequency, hematuria and urgency.       Vaginal discharge, thick and white Vaginal odor "fishy"  Skin: Negative.   Neurological: Negative.   Psychiatric/Behavioral: Negative.     Observations/Objective:   Assessment and Plan: 1. Bacterial vaginitis - metroNIDAZOLE (METROGEL) 0.75 % gel; Apply 1 application topically at bedtime for 5 days.  Dispense: 45 g; Refill: 0  2. Vaginal yeast infection - fluconazole (DIFLUCAN) 150 MG tablet; Take 1 tablet (150 mg total) by mouth once for 1 dose.  Dispense: 1 tablet; Refill: 0  Follow Up Instructions:    I discussed the assessment and treatment plan with the patient. The patient was provided an opportunity to ask questions and all were answered. The patient agreed with the plan and demonstrated an understanding of the instructions.   The patient was advised to call back or seek an in-person evaluation if the symptoms worsen or if the condition fails to improve as anticipated.  I provided 10 minutes of non-face-to-face time during this encounter.  Nolon Nations  APRN, MSN, FNP-C Patient Care [facility] [address] [place], [place] [address] [phone]   Julianne Handler, FNP 02/16/2020  1:53 PM

## 2020-02-16 NOTE — Addendum Note (Signed)
Addended by: Massie Maroon on: 02/16/2020 03:31 PM   Modules accepted: Orders

## 2020-02-16 NOTE — Patient Instructions (Addendum)
For bacterial vaginitis, will start metronidazole gel 1 applicator full at bedtime for a total of 5 days.  Also, will start Diflucan 150 mg x 1 dose for vaginal yeast infection.  Also, recommend over-the-counter Vagisil with boric acid for daily use to combat colonization of bacterial vaginitis.  I recommend that you follow-up with your PCP to discuss recurrent infections.  Please visit [email or web address] to find available PCPs.  [phone].  Bacterial Vaginosis  Bacterial vaginosis is an infection of the vagina. It happens when too many normal germs (healthy bacteria) grow in the vagina. This infection can make it easier to get other infections from sex (STIs). It is very important for pregnant women to get treated. This infection can cause babies to be born early or at a low birth weight. What are the causes? This infection is caused by an increase in certain germs that grow in the vagina. You cannot get this infection from toilet seats, bedsheets, swimming pools, or things that touch your vagina. What increases the risk?  Having sex with a new person or more than one person.  Having sex without protection.  Douching.  Having an intrauterine device (IUD).  Smoking.  Using drugs or drinking alcohol. These can lead you to do things that are risky.  Taking certain antibiotic medicines.  Being pregnant. What are the signs or symptoms? Some women have no symptoms. Symptoms may include:  A discharge from your vagina. It may be gray or white. It can be watery or foamy.  A fishy smell. This can happen after sex or during your menstrual period.  Itching in and around your vagina.  A feeling of burning or pain when you pee (urinate). How is this treated? This infection is treated with antibiotic medicines. These may be given to you as:  A pill.  A cream for your vagina.  A medicine that you put into your vagina (suppository). If the infection comes back after treatment, you may  need more antibiotics. Follow these instructions at home: Medicines  Take over-the-counter and prescription medicines as told by your doctor.  Take or use your antibiotic medicine as told by your doctor. Do not stop taking or using it, even if you start to feel better. General instructions  If the person you have sex with is a woman, tell her that you have this infection. She will need to follow up with her doctor. If you have a female partner, he does not need to be treated.  Do not have sex until you finish treatment.  Drink enough fluid to keep your pee pale yellow.  Keep your vagina and butt clean. ? Wash the area with warm water each day. ? Wipe from front to back after you use the toilet.  If you are breastfeeding a baby, ask your doctor if you should keep doing so during treatment.  Keep all follow-up visits. How is this prevented? Self-care  Do not douche.  Use only warm water to wash around your vagina.  Wear underwear that is cotton or lined with cotton.  Do not wear tight pants and pantyhose, especially in the summer. Safe sex  Use protection when you have sex. This includes: ? Use condoms. ? Use dental dams. This is a thin layer that protects the mouth during oral sex.  Limit how many people you have sex with. To prevent this infection, it is best to have sex with just one person.  Get tested for STIs. The person you have sex with  should also get tested. Drugs and alcohol  Do not smoke or use any products that contain nicotine or tobacco. If you need help quitting, ask your doctor.  Do not use drugs.  Do not drink alcohol if: ? Your doctor tells you not to drink. ? You are pregnant, may be pregnant, or are planning to become pregnant.  If you drink alcohol: ? Limit how much you have to 0-1 drink a day. ? Know how much alcohol is in your drink. In the [place] one drink equals one 12 oz bottle of beer (355 mL), one 5 oz glass of wine (148 mL), or one 1 oz  glass of hard liquor (44 mL). Where to find more information  Centers for Disease Control and Prevention: [employer]Exhibition.com.br  American Sexual Health Association: www.ashastd.org  Office on [employer] [facility]: http://hoffman.com/ Contact a doctor if:  Your symptoms do not get better, even after you are treated.  You have more discharge or pain when you pee.  You have a fever or chills.  You have pain in your belly (abdomen) or in the area between your hips.  You have pain with sex.  You bleed from your vagina between menstrual periods. Summary  This infection can happen when too many germs (bacteria) grow in the vagina.  This infection can make it easier to get infections from sex (STIs). Treating this can lower that chance.  Get treated if you are pregnant. This infection can cause babies to be born early.  Do not stop taking or using your antibiotic medicine, even if you start to feel better. This information is not intended to replace advice given to you by your health care provider. Make sure you discuss any questions you have with your health care provider. Document Revised: 06/26/2019 Document Reviewed: 06/26/2019 Elsevier Patient Education  2021 Elsevier Inc.   Vaginal Yeast Infection, Adult  Vaginal yeast infection is a condition that causes vaginal discharge as well as soreness, swelling, and redness (inflammation) of the vagina. This is a common condition. Some women get this infection frequently. What are the causes? This condition is caused by a change in the normal balance of the yeast (candida) and bacteria that live in the vagina. This change causes an overgrowth of yeast, which causes the inflammation. What increases the risk? The condition is more likely to develop in women who:  Take antibiotic medicines.  Have diabetes.  Take birth control pills.  Are pregnant.  Douche often.  Have a weak body defense system (immune system).  Have been taking steroid  medicines for a long time.  Frequently wear tight clothing. What are the signs or symptoms? Symptoms of this condition include:  White, thick, creamy vaginal discharge.  Swelling, itching, redness, and irritation of the vagina. The lips of the vagina (vulva) may be affected as well.  Pain or a burning feeling while urinating.  Pain during sex. How is this diagnosed? This condition is diagnosed based on:  Your medical history.  A physical exam.  A pelvic exam. Your health care provider will examine a sample of your vaginal discharge under a microscope. Your health care provider may send this sample for testing to confirm the diagnosis. How is this treated? This condition is treated with medicine. Medicines may be over-the-counter or prescription. You may be told to use one or more of the following:  Medicine that is taken by mouth (orally).  Medicine that is applied as a cream (topically).  Medicine that is inserted directly into  the vagina (suppository). Follow these instructions at home: Lifestyle  Do not have sex until your health care provider approves. Tell your sex partner that you have a yeast infection. That person should go to his or her health care provider and ask if they should also be treated.  Do not wear tight clothes, such as pantyhose or tight pants.  Wear breathable cotton underwear. General instructions  Take or apply over-the-counter and prescription medicines only as told by your health care provider.  Eat more yogurt. This may help to keep your yeast infection from returning.  Do not use tampons until your health care provider approves.  Try taking a sitz bath to help with discomfort. This is a warm water bath that is taken while you are sitting down. The water should only come up to your hips and should cover your buttocks. Do this 3-4 times per day or as told by your health care provider.  Do not douche.  If you have diabetes, keep your blood  sugar levels under control.  Keep all follow-up visits as told by your health care provider. This is important.   Contact a health care provider if:  You have a fever.  Your symptoms go away and then return.  Your symptoms do not get better with treatment.  Your symptoms get worse.  You have new symptoms.  You develop blisters in or around your vagina.  You have blood coming from your vagina and it is not your menstrual period.  You develop pain in your abdomen. Summary  Vaginal yeast infection is a condition that causes discharge as well as soreness, swelling, and redness (inflammation) of the vagina.  This condition is treated with medicine. Medicines may be over-the-counter or prescription.  Take or apply over-the-counter and prescription medicines only as told by your health care provider.  Do not douche. Do not have sex or use tampons until your health care provider approves.  Contact a health care provider if your symptoms do not get better with treatment or your symptoms go away and then return. This information is not intended to replace advice given to you by your health care provider. Make sure you discuss any questions you have with your health care provider. Document Revised: 07/26/2018 Document Reviewed: 05/14/2017 Elsevier Patient Education  2021 [employer].

## 2020-03-14 IMAGING — US US MFM OB DETAIL +14 WK
1 series · 13 of 28 positions shown · non-contrast
Comparison: none

[Series 1: us mfm ob detail +14 wk · 95 acquisitions, 13 frames shown]
[im 4/95]
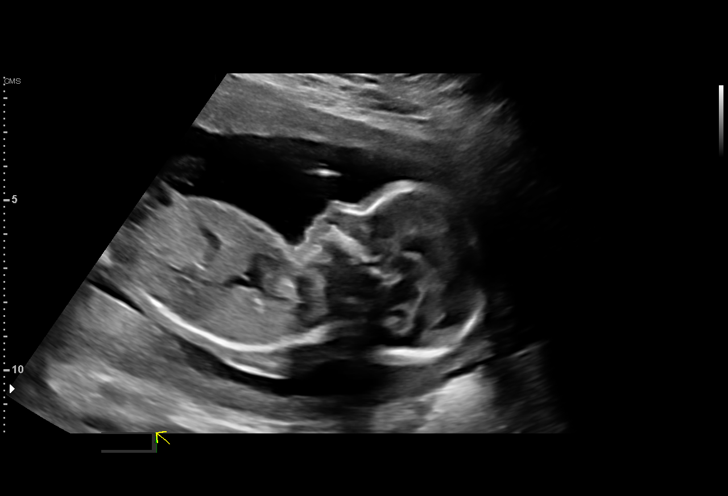
[im 11/95]
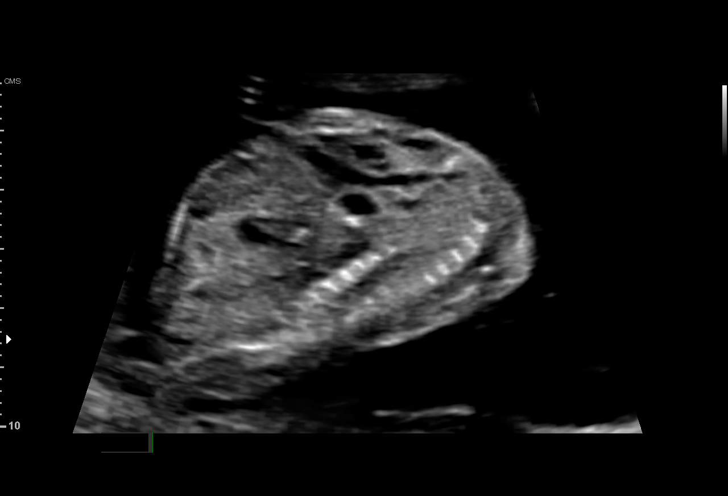
[im 18/95]
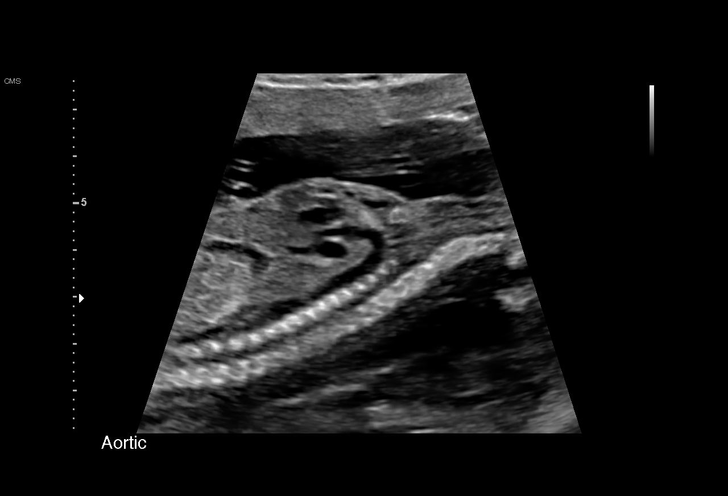
[im 25/95]
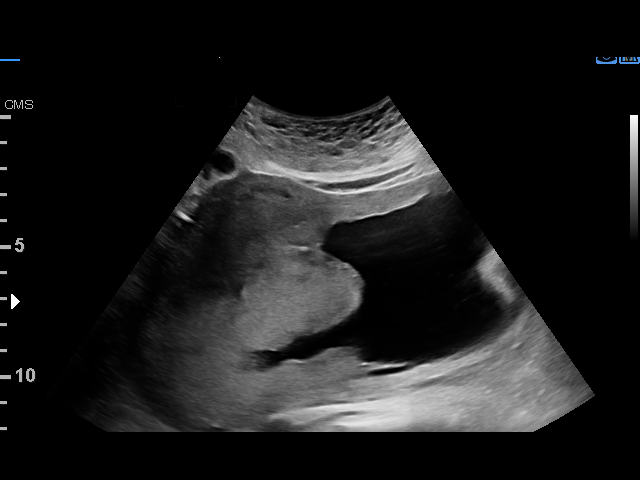
[im 32/95]
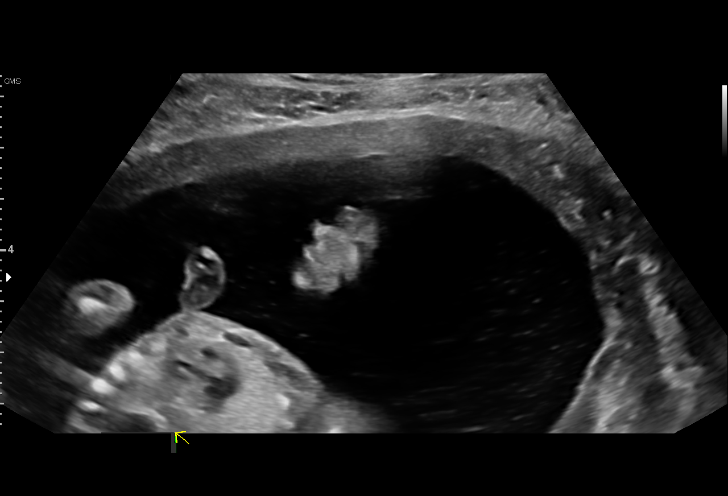
[im 39/95]
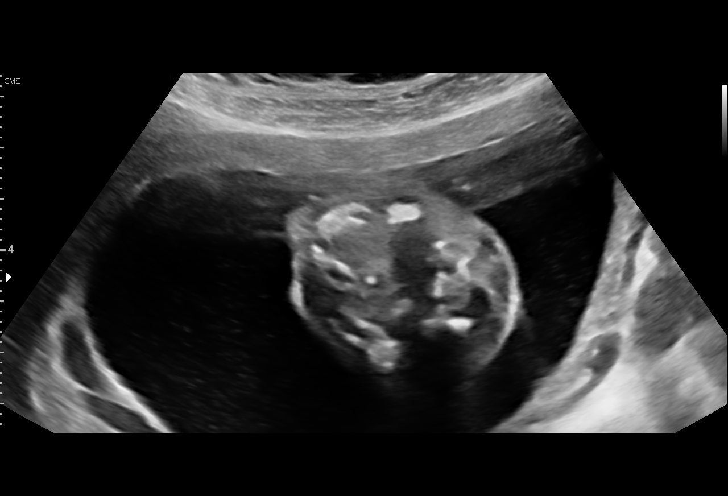
[im 49/95]
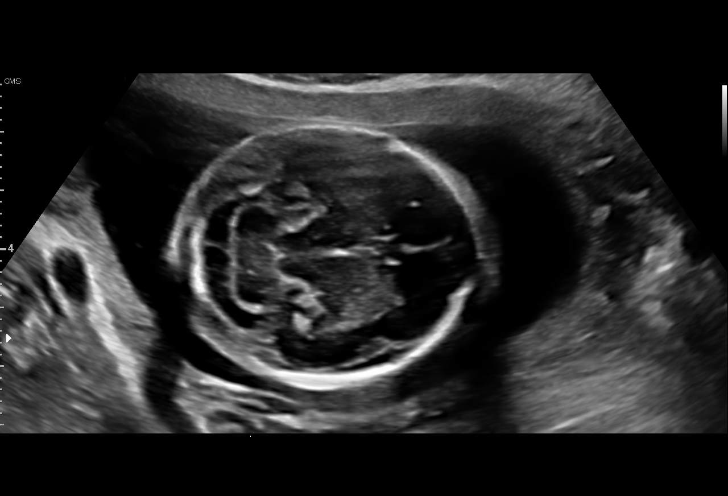
[im 56/95]
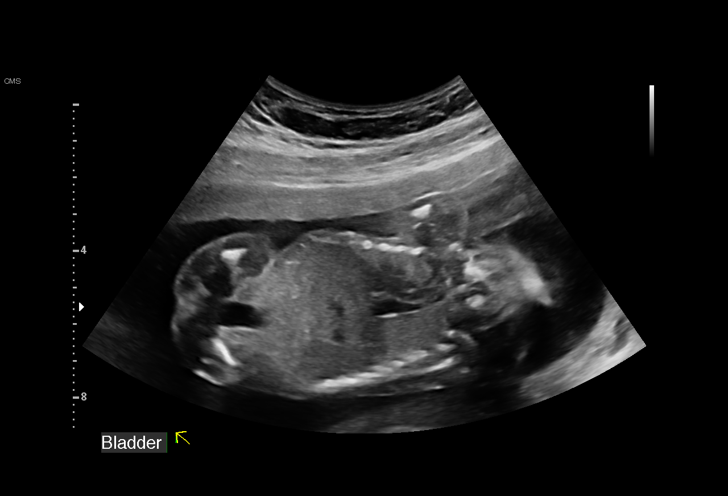
[im 63/95]
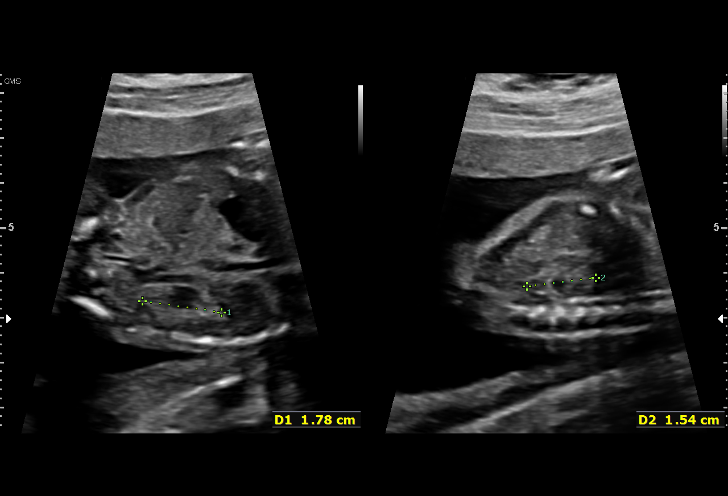
[im 70/95]
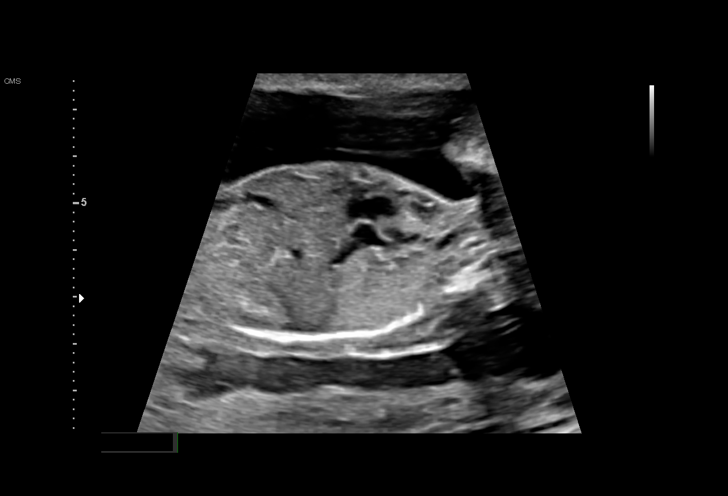
[im 77/95]
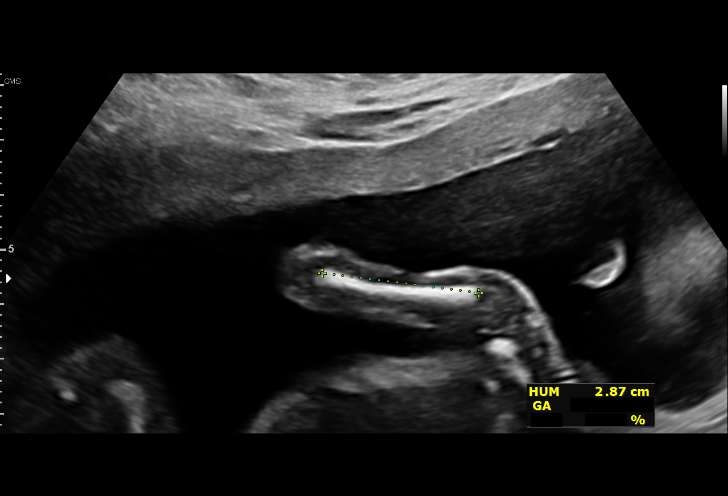
[im 84/95]
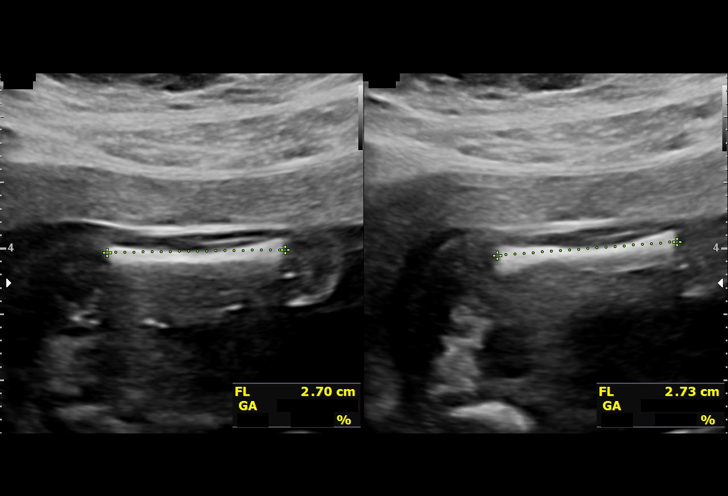
[im 91/95]
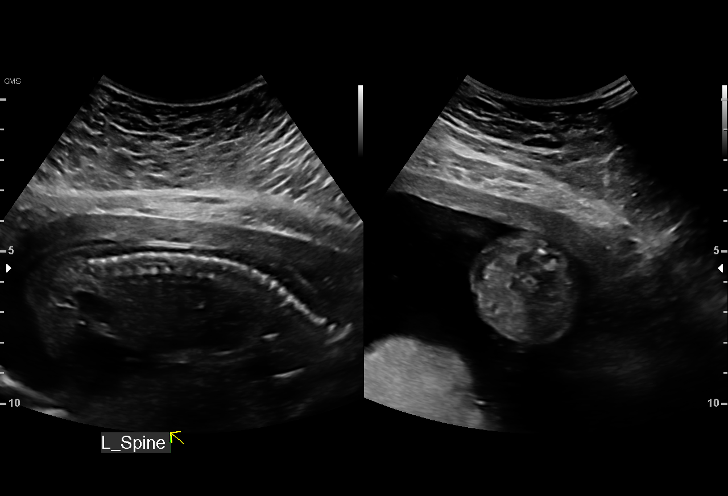

[13 of 28 positions shown; findings below may reference images not displayed]

[address]

 ----------------------------------------------------------------------

 ----------------------------------------------------------------------
Indications

  Antenatal screening for malformations
  Alcohol use complicating pregnancy, second
  trimester
  Tobacco use complicating pregnancy,
  second trimester (1 ppd)
  19 weeks gestation of pregnancy
 ----------------------------------------------------------------------
Fetal Evaluation

 Num Of Fetuses:          1
 Fetal Heart Rate(bpm):   150
 Cardiac Activity:        Observed
 Presentation:            Cephalic
 Placenta:                Left lateral
 P. Cord Insertion:       Visualized

 Amniotic Fluid
 AFI FV:      Within normal limits

                             Largest Pocket(cm)

Biometry

 BPD:      42.1  mm     G. Age:  18w 5d         39  %    CI:        75.36   %    70 - 86
                                                         FL/HC:       17.7  %    16.1 -
 HC:      153.8  mm     G. Age:  18w 2d         15  %    HC/AC:       1.12       1.09 -
 AC:      137.9  mm     G. Age:  19w 1d         53  %    FL/BPD:      64.6  %
 FL:       27.2  mm     G. Age:  18w 2d         20  %    FL/AC:       19.7  %    20 - 24
 HUM:      27.4  mm     G. Age:  18w 5d         43  %
 CER:      18.4  mm     G. Age:  18w 1d         26  %
 NFT:       4.6  mm
 CM:        4.4  mm
 Est. FW:     255   gm     0 lb 9 oz     31  %
OB History

 Gravidity:    4         Term:   3        Prem:   0        SAB:   0
 TOP:          0       Ectopic:  0        Living: 3
Gestational Age

 LMP:           19w 0d        Date:  04/10/18                 EDD:   01/15/19
 U/S Today:     18w 4d                                        EDD:   01/18/19
 Best:          19w 0d     Det. By:  LMP  (04/10/18)          EDD:   01/15/19
Anatomy

 Cranium:               Appears normal         LVOT:                   Appears normal
 Cavum:                 Appears normal         Aortic Arch:            Appears normal
 Ventricles:            Appears normal         Ductal Arch:            Appears normal
 Choroid Plexus:        Appears normal         Diaphragm:              Appears normal
 Cerebellum:            Appears normal         Stomach:                Appears normal, left
                                                                       sided
 Posterior Fossa:       Appears normal         Abdomen:                Appears normal
 Nuchal Fold:           Appears normal         Abdominal Wall:         Appears nml (cord
                                                                       insert, abd wall)
 Face:                  Appears normal         Cord Vessels:           Appears normal (3
                        (orbits and profile)                           vessel cord)
 Lips:                  Appears normal         Kidneys:                Appear normal
 Palate:                Appears normal         Bladder:                Appears normal
 Thoracic:              Appears normal         Spine:                  Appears normal
 Heart:                 Appears normal         Upper Extremities:      Appears normal
                        (4CH, axis, and
                        situs)
 RVOT:                  Appears normal         Lower Extremities:      Appears normal

 Other:  Heels/feet and hands/5th digit visualized.  Nasal bone visualized.
Cervix Uterus Adnexa

 Cervix
 Length:            3.9  cm.
 Normal appearance by transabdominal scan.

 Uterus
 No abnormality visualized.

 Left Ovary
 Within normal limits.

 Right Ovary
 Within normal limits.

 Cul De Sac
 No free fluid seen.

 Adnexa
 No abnormality visualized.
Impression

 We performed fetal anatomy scan. No makers of
 aneuploidies or fetal structural defects are seen. Fetal
 biometry is consistent with her previously-established dates.
 Amniotic fluid is normal and good fetal activity is seen.
 Patient understands the limitations of ultrasound in detecting
 fetal anomalies.
 Cell-free fetal DNA screening is pending.
Recommendations

 -An appointment was made for her to return in 8 weeks for
 fetal growth assessment.
                 Ceejay, Paulus N

## 2020-04-26 ENCOUNTER — Telehealth: Payer: Medicaid Other | Admitting: Physician Assistant

## 2020-04-26 DIAGNOSIS — B9689 Other specified bacterial agents as the cause of diseases classified elsewhere: Secondary | ICD-10-CM

## 2020-04-26 DIAGNOSIS — N760 Acute vaginitis: Secondary | ICD-10-CM | POA: Diagnosis not present

## 2020-04-26 MED ORDER — METRONIDAZOLE 500 MG PO TABS: 500.0000 mg | ORAL_TABLET | Freq: Two times a day (BID) | ORAL | 0 refills | Status: DC

## 2020-04-26 NOTE — Progress Notes (Signed)

## 2020-04-27 ENCOUNTER — Ambulatory Visit (HOSPITAL_COMMUNITY): Payer: Self-pay

## 2020-06-11 ENCOUNTER — Encounter: Payer: Self-pay | Admitting: Family

## 2020-06-11 ENCOUNTER — Telehealth: Payer: Medicaid Other | Admitting: Family

## 2020-06-11 DIAGNOSIS — L259 Unspecified contact dermatitis, unspecified cause: Secondary | ICD-10-CM

## 2020-06-11 MED ORDER — TRIAMCINOLONE ACETONIDE 0.5 % EX OINT: 1.0000 "application " | TOPICAL_OINTMENT | Freq: Two times a day (BID) | CUTANEOUS | 0 refills | Status: DC

## 2020-06-11 NOTE — Progress Notes (Signed)
   Virtual Visit  Note Due to COVID-19 pandemic this visit was conducted virtually. This visit type was conducted due to national recommendations for restrictions regarding the COVID-19 Pandemic (e.g. social distancing, sheltering in place) in an effort to limit this patient's exposure and mitigate transmission in our community. All issues noted in this document were discussed and addressed.  A physical exam was not performed with this format.  I connected with Zuleyka K Krikorian on 06/11/20 at 1:55 pm  by video and verified that I am speaking with the correct person using two identifiers. Naiya MELISIA LEMING is currently located at home and no one is currently with her during visit. The provider, Jannifer Rodney, FNP is located in their office at time of visit.  I discussed the limitations, risks, security and privacy concerns of performing an evaluation and management service by video and the availability of in person appointments. I also discussed with the patient that there may be a patient responsible charge related to this service. The patient expressed understanding and agreed to proceed.   History and Present Illness:  Rash This is a new problem. The current episode started in the past 7 days. The problem is unchanged. Location: right bicep  The rash is characterized by itchiness and redness. Associated with: antifungal. Pertinent negatives include no fatigue, fever, shortness of breath or sore throat. Treatments tried: antifungal. The treatment provided mild relief.      Review of Systems  Constitutional: Negative for fatigue and fever.  HENT: Negative for sore throat.   Respiratory: Negative for shortness of breath.   Skin: Positive for rash.     Observations/Objective: No SOB or distress noted, nickel size rash on right bicep with small blister  Assessment and Plan: 1. Contact dermatitis, unspecified contact dermatitis type, unspecified trigger Avoid scratching  Cool compresses  as needed Avoid triggers Follow up if symptoms worsen or do not improve  - triamcinolone ointment (KENALOG) 0.5 %; Apply 1 application topically 2 (two) times daily.  Dispense: 60 g; Refill: 0     I discussed the assessment and treatment plan with the patient. The patient was provided an opportunity to ask questions and all were answered. The patient agreed with the plan and demonstrated an understanding of the instructions.   The patient was advised to call back or seek an in-person evaluation if the symptoms worsen or if the condition fails to improve as anticipated.  The above assessment and management plan was discussed with the patient. The patient verbalized understanding of and has agreed to the management plan. Patient is aware to call the clinic if symptoms persist or worsen. Patient is aware when to return to the clinic for a follow-up visit. Patient educated on when it is appropriate to go to the emergency department.   Time call ended:  2:05 pm   I provided 10 minutes of   face-to-face time during this encounter.    Jannifer Rodney, FNP

## 2020-06-25 ENCOUNTER — Telehealth: Payer: Medicaid Other | Admitting: Physician Assistant

## 2020-06-25 DIAGNOSIS — N760 Acute vaginitis: Secondary | ICD-10-CM | POA: Diagnosis not present

## 2020-06-25 DIAGNOSIS — B9689 Other specified bacterial agents as the cause of diseases classified elsewhere: Secondary | ICD-10-CM

## 2020-06-25 MED ORDER — METRONIDAZOLE 500 MG PO TABS: 500.0000 mg | ORAL_TABLET | Freq: Two times a day (BID) | ORAL | 0 refills | Status: DC

## 2020-06-25 NOTE — Progress Notes (Signed)
Virtual Visit Consent   Nancy Henson, you are scheduled for a virtual visit with a [facility] provider today.     Just as with appointments in the office, your consent must be obtained to participate.  Your consent will be active for this visit and any virtual visit you may have with one of our providers in the next 365 days.     If you have a MyChart account, a copy of this consent can be sent to you electronically.  All virtual visits are billed to your insurance company just like a traditional visit in the office.    As this is a virtual visit, video technology does not allow for your provider to perform a traditional examination.  This may limit your provider's ability to fully assess your condition.  If your provider identifies any concerns that need to be evaluated in person or the need to arrange testing (such as labs, EKG, etc.), we will make arrangements to do so.     Although advances in technology are sophisticated, we cannot ensure that it will always work on either your end or our end.  If the connection with a video visit is poor, the visit may have to be switched to a telephone visit.  With either a video or telephone visit, we are not always able to ensure that we have a secure connection.     I need to obtain your verbal consent now.   Are you willing to proceed with your visit today?    Terrence SARAANN ENNEKING has provided verbal consent on 06/25/2020 for a virtual visit (video or telephone).   Piedad Climes, [place]   Date: 06/25/2020 3:21 PM   Virtual Visit via Video Note   I, Piedad Climes, connected with KAYLE PASSARELLI ([id number], [birth year]) on 06/25/20 at  3:15 PM EDT by a video-enabled telemedicine application and verified that I am speaking with the correct person using two identifiers.  Location: Patient: Virtual Visit Location Patient: Home Provider: Virtual Visit Location Provider: Home Office   I discussed the limitations of evaluation and  management by telemedicine and the availability of in person appointments. The patient expressed understanding and agreed to proceed.    History of Present Illness: Austen SYLVER VANTASSELL is a 25 y.o. who identifies as a female who was assigned female at birth, and is being seen today for possible yeast infection. Has noted a very thin discharged associated with foul-odor. Denies any vaginal itching or pain. Denies urinary symptoms. Denies pain with  intercourse. LMP 3 weeks ago. S/p tubal ligation.  Denies concern for STD. Does have history of bacterial vaginosis.   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  Resting comfortably at home.  Head is normocephalic, atraumatic.  No labored breathing. Speech is clear and coherent with  logical content.  Patient is alert and oriented at baseline.   Assessment and Plan: 1. Bacterial vaginosis - metroNIDAZOLE (FLAGYL) 500 MG tablet; Take 1 tablet (500 mg total) by mouth 2 (two) times daily.  Dispense: 14 tablet; Refill: 0 Mild symptoms. Typical for her bv flares in the past. Will treat empirically with metronidazole BID x 7 days. Supportive measures reviewed. Discussed if not resolving she will need in-person  evaluation.  Follow Up Instructions: I discussed the assessment and treatment plan with the patient. The patient was provided an opportunity to ask questions and all were answered. The patient agreed with the plan and demonstrated an understanding of the instructions.  A copy of instructions were sent to the patient via MyChart.  The patient was advised to call back or seek an in-person evaluation if the symptoms worsen or if the condition fails to improve as anticipated.  Time:  I spent 10 minutes with the patient via telehealth technology discussing the above problems/concerns.    Piedad Climes, PA-C

## 2020-06-25 NOTE — Patient Instructions (Signed)
  Nancy Henson, thank you for joining Piedad Climes, PA-C for today's virtual visit.  While this provider is not your primary care provider (PCP), if your PCP is located in our provider database this encounter information will be shared with them immediately following your visit.  Consent: (Patient) Nancy Henson provided verbal consent for this virtual visit at the beginning of the encounter.  Current Medications:  Current Outpatient Medications:    metroNIDAZOLE (FLAGYL) 500 MG tablet, Take 1 tablet (500 mg total) by mouth 2 (two) times daily., Disp: 14 tablet, Rfl: 0   Medications ordered in this encounter:  Meds ordered this encounter  Medications   metroNIDAZOLE (FLAGYL) 500 MG tablet    Sig: Take 1 tablet (500 mg total) by mouth 2 (two) times daily.    Dispense:  14 tablet    Refill:  0    Order Specific Question:   Supervising Provider    Answer:   Hyacinth Meeker, BRIAN [[id number]]     *If you need refills on other medications prior to your next appointment, please contact your pharmacy*  Follow-Up: Call back or seek an in-person evaluation if the symptoms worsen or if the condition fails to improve as anticipated.  Other Instructions Please take the Metronidazole (Flagyl) as directed for BV. Start a daily probiotic - [employer] makes probiotics for women's reproductive health. If symptoms are not resolving, anything worsens or new symptoms develop, you will need to be seen in person.   If you or a family member do not have a primary care provider, use the link below to schedule a visit and establish care. When you choose a [facility] primary care physician or advanced practice provider, you gain a long-term partner in health. Find a Primary Care Provider  - I recommend [facility] or [facility].

## 2020-09-20 ENCOUNTER — Ambulatory Visit (HOSPITAL_COMMUNITY)
Admission: EM | Admit: 2020-09-20 | Discharge: 2020-09-20 | Disposition: A | Discharge status: Home / Self Care | Payer: Medicaid Other | Attending: Internal Medicine | Admitting: Internal Medicine

## 2020-09-20 ENCOUNTER — Encounter (HOSPITAL_COMMUNITY): Payer: Self-pay | Admitting: Emergency Medicine

## 2020-09-20 ENCOUNTER — Other Ambulatory Visit: Payer: Self-pay

## 2020-09-20 DIAGNOSIS — L0591 Pilonidal cyst without abscess: Secondary | ICD-10-CM | POA: Diagnosis not present

## 2020-09-20 DIAGNOSIS — Z20822 Contact with and (suspected) exposure to covid-19: Secondary | ICD-10-CM | POA: Insufficient documentation

## 2020-09-20 MED ORDER — SULFAMETHOXAZOLE-TRIMETHOPRIM 800-160 MG PO TABS: 1.0000 | ORAL_TABLET | Freq: Two times a day (BID) | ORAL | 0 refills | Status: AC

## 2020-09-20 NOTE — ED Triage Notes (Signed)
Pt c/o cough and sore throat for couple days. Reports bump on buttock area that is a hard bump and painful, denies drainage. Reports get it every year for past 3 years.

## 2020-09-20 NOTE — Discharge Instructions (Addendum)
Please do sitz bath or apply heating pad use 3-4 times a day Take medications as prescribed If symptoms worsen

## 2020-09-21 LAB — SARS CORONAVIRUS 2 (TAT 6-24 HRS): SARS Coronavirus 2: NEGATIVE

## 2020-09-23 NOTE — ED Provider Notes (Signed)
[facility]    CSN: [id number] Arrival date & time: 09/20/20  1745      History   Chief Complaint Chief Complaint  Patient presents with   Cough   Sore Throat   Abscess    HPI Nancy Henson is a 25 y.o. female comes to urgent care with painful swelling in the buttocks area.  Patient says symptoms started a few days ago and has been persistent.  It is painful and firm but denies any discharge.  Patient's symptoms are recurrent.  No trauma.  Patient gets these episodes about every 3 years.  She denies any history of pilonidal cyst.  Patient also reports of a sore throat and a nonproductive cough of a couple of days duration.  She denies any shortness of breath or wheezing.  No febrile episodes.  No sick contacts.  No chest pain or chest pressure.  Patient is not vaccinated against COVID-19 virus. 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 Authorizing Provider  sulfamethoxazole-trimethoprim (BACTRIM DS) 800-160 MG tablet Take 1 tablet by mouth 2 (two) times daily for 7 days. 09/20/20 09/27/20 Yes Marizol Borror, Britta Mccreedy, MD    Family History Family History  Problem Relation Age of Onset   Healthy Mother    Alcohol abuse Neg Hx    Arthritis Neg Hx    Asthma Neg Hx    Birth defects Neg Hx    Cancer Neg Hx    COPD Neg Hx    Depression Neg Hx    Diabetes Neg Hx    Drug abuse Neg Hx    Early death Neg Hx    Hearing loss Neg Hx    Heart disease Neg Hx    Hyperlipidemia Neg Hx    Hypertension Neg Hx    Kidney disease Neg Hx    Learning disabilities Neg Hx    Mental illness Neg Hx    Mental retardation Neg Hx    Miscarriages / Stillbirths Neg Hx    Stroke Neg Hx    Vision loss Neg Hx    Varicose Veins Neg Hx  Social History Social History   Tobacco Use   Smoking status: Every Day    Packs/day: 0.25    Years: 7.00    Pack years: 1.75    Types: Cigarettes   Smokeless tobacco: Never   Tobacco comments:    refused  Vaping Use   Vaping Use: Never used  Substance Use Topics   Alcohol use: Yes    Alcohol/week: 2.0 standard drinks    Types: 2 Cans of beer per week   Drug use: Not Currently    Types: Cocaine, Marijuana     Allergies   Patient has no known allergies.   Review of Systems Review of Systems  Constitutional: Negative.   HENT:  Positive for congestion and sore throat.   Respiratory:  Positive for cough. Negative for chest tightness, shortness of breath and wheezing.   Gastrointestinal: Negative.   Musculoskeletal: Negative.   Skin:  Positive for color change. Negative for pallor, rash and wound.  Neurological:  Negative for headaches.    Physical Exam Triage Vital Signs ED Triage Vitals  Enc Vitals Group     BP 09/20/20 1805 128/85     Pulse Rate 09/20/20 1805 91     Resp 09/20/20 1805 17     Temp 09/20/20 1805 99.2 F (37.3 C)     Temp Source 09/20/20 1805  Oral     SpO2 09/20/20 1805 97 %     Weight --      Height --      Head Circumference --      Peak Flow --      Pain Score 09/20/20 1803 8     Pain Loc --      Pain Edu? --      Excl. in GC? --    No data found.  Updated Vital Signs BP 128/85 (BP Location: Right Arm)   Pulse 91   Temp 99.2 F (37.3 C) (Oral)   Resp 17   LMP 09/09/2020 (Approximate)   SpO2 97%   Visual Acuity Right Eye Distance:   Left Eye Distance:   Bilateral Distance:    Right Eye Near:   Left Eye Near:    Bilateral Near:     Physical Exam Vitals and nursing note reviewed.  Constitutional:      General: She is not in acute distress.    Appearance: She is well-developed. She is not ill-appearing.  HENT:     Right Ear: Tympanic membrane normal.     Left Ear: Tympanic membrane normal.     Mouth/Throat:     Mouth: Mucous membranes are moist.     Pharynx: No posterior oropharyngeal erythema.     Tonsils: No tonsillar exudate or tonsillar abscesses.  Cardiovascular:     Rate and Rhythm: Normal rate and regular rhythm.  Pulmonary:     Effort: Pulmonary effort is normal.     Breath sounds: Normal breath sounds.  Abdominal:     General: Bowel sounds are normal.     Palpations: Abdomen is soft.  Skin:    Comments: Pea-sized tender swelling in the natal cleft.  Needle aspiration is negative.  It is tender to touch and has mild erythema overlying it.  Neurological:     Mental Status: She is alert.     UC Treatments / Results  Labs (all labs ordered are listed, but only abnormal results are displayed) Labs Reviewed  SARS CORONAVIRUS 2 (TAT 6-24 HRS)    EKG   Radiology No  results found.  Procedures Procedures (including critical care time)  Medications Ordered in UC Medications - No data to display  Initial Impression / Assessment and Plan / UC Course  I have reviewed the triage vital signs and the nursing notes.  Pertinent labs & imaging results that were available during my care  of the patient were reviewed by me and considered in my medical decision making (see chart for details).     1.  Pilonidal cyst infection without abscess: Sitz bath's recommended Bactrim double strength 1 tablet twice daily for 7 days Ibuprofen as needed for pain Return precautions given. Heating pad use is recommended If patient develops any fluctuance, discharge, worsening pain-she is advised to return to urgent care to be reevaluated.  2.  Viral URI with cough: COVID-19 PCR test has been sent We will call patient with recommendations if labs are abnormal Return to urgent care if symptoms worsen. Final Clinical Impressions(s) / UC Diagnoses   Final diagnoses:  Pilonidal cyst without abscess     Discharge Instructions      Please do sitz bath or apply heating pad use 3-4 times a day Take medications as prescribed If symptoms worsen     ED Prescriptions     Medication Sig Dispense Auth. Provider   sulfamethoxazole-trimethoprim (BACTRIM DS) 800-160 MG tablet Take 1 tablet by mouth 2 (two) times daily for 7 days. 14 tablet Conception Doebler, Britta Mccreedy, MD      PDMP not reviewed this encounter.   Merrilee Jansky, MD 09/23/20 [phone]

## 2020-09-27 ENCOUNTER — Telehealth: Payer: Medicaid Other | Admitting: Physician Assistant

## 2020-09-27 DIAGNOSIS — T3695XA Adverse effect of unspecified systemic antibiotic, initial encounter: Secondary | ICD-10-CM

## 2020-09-27 DIAGNOSIS — B379 Candidiasis, unspecified: Secondary | ICD-10-CM

## 2020-09-27 MED ORDER — FLUCONAZOLE 150 MG PO TABS: 150.0000 mg | ORAL_TABLET | Freq: Once | ORAL | 0 refills | Status: AC

## 2020-09-27 NOTE — Patient Instructions (Signed)
Nancy Henson, thank you for joining Margaretann Loveless, PA-C for today's virtual visit.  While this provider is not your primary care provider (PCP), if your PCP is located in our provider database this encounter information will be shared with them immediately following your visit.  Consent: (Patient) Nancy Henson provided verbal consent for this virtual visit at the beginning of the encounter.  Current Medications:  Current Outpatient Medications:    fluconazole (DIFLUCAN) 150 MG tablet, Take 1 tablet (150 mg total) by mouth once for 1 dose. May repeat in 48-72 hours if needed, Disp: 2 tablet, Rfl: 0   sulfamethoxazole-trimethoprim (BACTRIM DS) 800-160 MG tablet, Take 1 tablet by mouth 2 (two) times daily for 7 days., Disp: 14 tablet, Rfl: 0   Medications ordered in this encounter:  Meds ordered this encounter  Medications   fluconazole (DIFLUCAN) 150 MG tablet    Sig: Take 1 tablet (150 mg total) by mouth once for 1 dose. May repeat in 48-72 hours if needed    Dispense:  2 tablet    Refill:  0    Order Specific Question:   Supervising Provider    Answer:   Hyacinth Meeker, BRIAN [[id number]]     *If you need refills on other medications prior to your next appointment, please contact your pharmacy*  Follow-Up: Call back or seek an in-person evaluation if the symptoms worsen or if the condition fails to improve as anticipated.  Other Instructions Vaginal Yeast Infection, Adult Vaginal yeast infection is a condition that causes vaginal discharge as well as soreness, swelling, and redness (inflammation) of the vagina. This is a common condition. Some women get this infection frequently. What are the causes? This condition is caused by a change in the normal balance of the yeast (Candida) and normal bacteria that live in the vagina. This change causes an overgrowth of yeast, which causes the inflammation. What increases the risk? The condition is more likely to develop in women  who: Take antibiotic medicines. Have diabetes. Take birth control pills. Are pregnant. Douche often. Have a weak body defense system (immune system). Have been taking steroid medicines for a long time. Frequently wear tight clothing. What are the signs or symptoms? Symptoms of this condition include: White, thick, creamy vaginal discharge. Swelling, itching, redness, and irritation of the vagina. The lips of the vagina (labia) may be affected as well. Pain or a burning feeling while urinating. Pain during sex. How is this diagnosed? This condition is diagnosed based on: Your medical history. A physical exam. A pelvic exam. Your health care provider will examine a sample of your vaginal discharge under a microscope. Your health care provider may send this sample for testing to confirm the diagnosis. How is this treated? This condition is treated with medicine. Medicines may be over-the-counter or prescription. You may be told to use one or more of the following: Medicine that is taken by mouth (orally). Medicine that is applied as a cream (topically). Medicine that is inserted directly into the vagina (suppository). Follow these instructions at home: Take or apply over-the-counter and prescription medicines only as told by your health care provider. Do not use tampons until your health care provider approves. Do not have sex until your infection has cleared. Sex can prolong or worsen your symptoms of infection. Ask your health care provider when it is safe to resume sexual activity. Keep all follow-up visits. This is important. How is this prevented?  Do not wear tight clothes, such as pantyhose  or tight pants. Wear breathable cotton underwear. Do not use douches, perfumed soap, creams, or powders. Wipe from front to back after using the toilet. If you have diabetes, keep your blood sugar levels under control. Ask your health care provider for other ways to prevent yeast  infections. Contact a health care provider if: You have a fever. Your symptoms go away and then return. Your symptoms do not get better with treatment. Your symptoms get worse. You have new symptoms. You develop blisters in or around your vagina. You have blood coming from your vagina and it is not your menstrual period. You develop pain in your abdomen. Summary Vaginal yeast infection is a condition that causes discharge as well as soreness, swelling, and redness (inflammation) of the vagina. This condition is treated with medicine. Medicines may be over-the-counter or prescription. Take or apply over-the-counter and prescription medicines only as told by your health care provider. Do not douche. Resume sexual activity or use of tampons as instructed by your health care provider. Contact a health care provider if your symptoms do not get better with treatment or your symptoms go away and then return. This information is not intended to replace advice given to you by your health care provider. Make sure you discuss any questions you have with your health care provider. Document Revised: 03/15/2020 Document Reviewed: 03/15/2020 Elsevier Patient Education  2022 [employer].    If you have been instructed to have an in-person evaluation today at a local Urgent Care facility, please use the link below. It will take you to a list of all of our available [facility], including address, phone number and hours of operation. Please do not delay care.  [facility]  If you or a family member do not have a primary care provider, use the link below to schedule a visit and establish care. When you choose a [facility] primary care physician or advanced practice provider, you gain a long-term partner in health. Find a Primary Care Provider  Learn more about [facility]'s in-office and virtual care options: [facility] - Get Care Now

## 2020-09-27 NOTE — Progress Notes (Signed)
Virtual Visit Consent   GRACIN SOOHOO, you are scheduled for a virtual visit with a [facility] provider today.     Just as with appointments in the office, your consent must be obtained to participate.  Your consent will be active for this visit and any virtual visit you may have with one of our providers in the next 365 days.     If you have a MyChart account, a copy of this consent can be sent to you electronically.  All virtual visits are billed to your insurance company just like a traditional visit in the office.    As this is a virtual visit, video technology does not allow for your provider to perform a traditional examination.  This may limit your provider's ability to fully assess your condition.  If your provider identifies any concerns that need to be evaluated in person or the need to arrange testing (such as labs, EKG, etc.), we will make arrangements to do so.     Although advances in technology are sophisticated, we cannot ensure that it will always work on either your end or our end.  If the connection with a video visit is poor, the visit may have to be switched to a telephone visit.  With either a video or telephone visit, we are not always able to ensure that we have a secure connection.     I need to obtain your verbal consent now.   Are you willing to proceed with your visit today?    Nancy Henson has provided verbal consent on 09/27/2020 for a virtual visit (video or telephone).   Margaretann Loveless, PA-C   Date: 09/27/2020 11:25 AM   Virtual Visit via Video Note   I, Margaretann Loveless, connected with  Nancy Henson  ([id number], Nov 09, 1998) on 09/27/20 at 11:30 AM EDT by a video-enabled telemedicine application and verified that I am speaking with the correct person using two identifiers.  Location: Patient: Virtual Visit Location Patient: Home Provider: Virtual Visit Location Provider: Home Office   I discussed the limitations of evaluation and  management by telemedicine and the availability of in person appointments. The patient expressed understanding and agreed to proceed.    History of Present Illness: Nancy Henson is a 25 y.o. who identifies as a female who was assigned female at birth, and is being seen today for possible Yeast infection.  HPI: Vaginal Discharge The patient's primary symptoms include vaginal discharge. The patient's pertinent negatives include no genital itching, genital odor or genital rash. The current episode started in the past 7 days (Thursday she went to [facility] and was treated for a pilonidal cyst with an antibiotic; pilonidal cyst has resolved). The problem occurs constantly. The problem has been unchanged. The vaginal discharge was thin, white, watery and copious. There has been no bleeding. She has not been passing clots. She has not been passing tissue. Nothing aggravates the symptoms. She has tried nothing for the symptoms.     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 May repeat in 48-72 hours if needed, Disp: 2 tablet, Rfl: 0   sulfamethoxazole-trimethoprim (BACTRIM DS) 800-160 MG tablet, Take 1 tablet by mouth 2 (two) times daily for 7 days., Disp: 14 tablet, Rfl: 0  Observations/Objective: Patient is well-developed, well-nourished in no acute distress.  Resting comfortably at home.   Head is normocephalic, atraumatic.  No labored breathing. Speech is clear and coherent with logical content.  Patient is alert and oriented at baseline.    Assessment and Plan: 1. Antibiotic-induced yeast infection - fluconazole (DIFLUCAN) 150 MG tablet; Take 1 tablet (150 mg total) by mouth once for 1 dose. May repeat in 48-72 hours if needed  Dispense: 2 tablet; Refill: 0 Suspect yeast infection following recent antibiotics.  - Diflucan given; May repeat in 72 hours    Follow Up Instructions: I discussed the assessment and treatment plan with the patient. The patient was provided an opportunity to ask questions and all were answered. The patient agreed with the plan and demonstrated an understanding of the instructions.  A copy of instructions were sent to the patient via MyChart.  The patient was advised to call back or seek an in-person evaluation if the symptoms worsen or if the condition fails to improve as anticipated.  Time:  I spent 8 minutes with the patient via telehealth technology discussing the above problems/concerns.    Margaretann Loveless, PA-C

## 2020-12-03 ENCOUNTER — Telehealth: Payer: Medicaid Other | Admitting: Emergency Medicine

## 2020-12-03 DIAGNOSIS — R0989 Other specified symptoms and signs involving the circulatory and respiratory systems: Secondary | ICD-10-CM

## 2020-12-03 MED ORDER — OSELTAMIVIR PHOSPHATE 75 MG PO CAPS: 75.0000 mg | ORAL_CAPSULE | Freq: Two times a day (BID) | ORAL | 0 refills | Status: DC

## 2020-12-03 NOTE — Progress Notes (Signed)
Virtual Visit Consent   Nancy Henson, you are scheduled for a virtual visit with a [facility] provider today.     Just as with appointments in the office, your consent must be obtained to participate.  Your consent will be active for this visit and any virtual visit you may have with one of our providers in the next 365 days.     If you have a MyChart account, a copy of this consent can be sent to you electronically.  All virtual visits are billed to your insurance company just like a traditional visit in the office.    As this is a virtual visit, video technology does not allow for your provider to perform a traditional examination.  This may limit your provider's ability to fully assess your condition.  If your provider identifies any concerns that need to be evaluated in person or the need to arrange testing (such as labs, EKG, etc.), we will make arrangements to do so.     Although advances in technology are sophisticated, we cannot ensure that it will always work on either your end or our end.  If the connection with a video visit is poor, the visit may have to be switched to a telephone visit.  With either a video or telephone visit, we are not always able to ensure that we have a secure connection.     I need to obtain your verbal consent now.   Are you willing to proceed with your visit today?    [name] has provided verbal consent on 12/03/2020 for a virtual visit (video or telephone).   Cathlyn Parsons, NP   Date: 12/03/2020 11:43 AM   Virtual Visit via Video Note   I, Cathlyn Parsons, connected with  Nancy Henson  ([id number], [birth year]) on 12/03/20 at 11:00 AM EST by a video-enabled telemedicine application and verified that I am speaking with the correct person using two identifiers.  Location: Patient: Virtual Visit Location Patient: Home Provider: Virtual Visit Location Provider: Home Office   I discussed the limitations of evaluation and management  by telemedicine and the availability of in person appointments. The patient expressed understanding and agreed to proceed.    History of Present Illness: Nancy Henson is a 25 y.o. who identifies as a female who was assigned female at birth, and is being seen today for cough.  2 days ago she had a sudden onset development of symptoms.  Symptoms include cough, body aches, fever (T-max 100.3 F), fatigue, sore throat, nasal congestion and postnasal drainage.  She has not tested herself for flu at home.  She is not short of breath.  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  Resting comfortably  at home.  Head is normocephalic, atraumatic.  No labored breathing.  Speech is clear and coherent with logical content.  Patient is alert and oriented at baseline.    Assessment and Plan: 1. Suspected novel influenza A virus infection  Prescribed Tamiflu.  Reviewed supportive care measures.  Given note for school.  Follow Up Instructions: I discussed the assessment and treatment plan with the patient. The patient was provided an opportunity to ask questions and all were answered. The patient agreed with the plan and demonstrated an understanding of the  instructions.  A copy of instructions were sent to the patient via MyChart unless otherwise noted below.    The patient was advised to call back or seek an in-person evaluation if the symptoms worsen or if the condition fails to improve as anticipated.  Time:  I spent 10 minutes with the patient via telehealth technology discussing the above problems/concerns.    Cathlyn Parsons, NP

## 2020-12-03 NOTE — Patient Instructions (Signed)
  Nancy Henson, thank you for joining Cathlyn Parsons, NP for today's virtual visit.  While this provider is not your primary care provider (PCP), if your PCP is located in our provider database this encounter information will be shared with them immediately following your visit.  Consent: (Patient) Nancy Henson provided verbal consent for this virtual visit at the beginning of the encounter.  Current Medications:  Current Outpatient Medications:    oseltamivir (TAMIFLU) 75 MG capsule, Take 1 capsule (75 mg total) by mouth 2 (two) times daily., Disp: 10 capsule, Rfl: 0   Medications ordered in this encounter:  Meds ordered this encounter  Medications   oseltamivir (TAMIFLU) 75 MG capsule    Sig: Take 1 capsule (75 mg total) by mouth 2 (two) times daily.    Dispense:  10 capsule    Refill:  0     *If you need refills on other medications prior to your next appointment, please contact your pharmacy*  Follow-Up: Call back or seek an in-person evaluation if the symptoms worsen or if the condition fails to improve as anticipated.  Other Instructions Use over-the-counter cold and flu medicines to help manage her symptoms until you are feeling better.  The antiviral flu medicine will help you get better faster but you still need to manage her symptoms until you get better.  Make sure you drink lots of liquids and stay hydrated  If you have been instructed to have an in-person evaluation today at a local Urgent Care facility, please use the link below. It will take you to a list of all of our available [facility], including address, phone number and hours of operation. Please do not delay care.  [facility]  If you or a family member do not have a primary care provider, use the link below to schedule a visit and establish care. When you choose a [facility] primary care physician or advanced practice provider, you gain a long-term partner in health. Find a  Primary Care Provider  Learn more about [facility]'s in-office and virtual care options: [facility] - Get Care Now

## 2020-12-04 ENCOUNTER — Telehealth: Payer: Medicaid Other

## 2020-12-18 ENCOUNTER — Emergency Department (HOSPITAL_COMMUNITY)
Admission: EM | Admit: 2020-12-18 | Discharge: 2020-12-18 | Disposition: A | Discharge status: Home / Self Care | Payer: Medicaid Other | Attending: Emergency Medicine | Admitting: Emergency Medicine

## 2020-12-18 ENCOUNTER — Encounter (HOSPITAL_COMMUNITY): Payer: Self-pay | Admitting: Emergency Medicine

## 2020-12-18 ENCOUNTER — Telehealth: Payer: Medicaid Other | Admitting: Emergency Medicine

## 2020-12-18 ENCOUNTER — Other Ambulatory Visit: Payer: Self-pay

## 2020-12-18 DIAGNOSIS — F17210 Nicotine dependence, cigarettes, uncomplicated: Secondary | ICD-10-CM | POA: Diagnosis not present

## 2020-12-18 DIAGNOSIS — R200 Anesthesia of skin: Secondary | ICD-10-CM | POA: Diagnosis not present

## 2020-12-18 NOTE — ED Provider Notes (Signed)
[facility]-EMERGENCY DEPT Provider Note   CSN: [id number] Arrival date & time: 12/18/20  1314     History No chief complaint on file.   Nancy Henson is a 25 y.o. female with no pertinent past medical history presenting today with a complaint of numbness to the distal part of her right index finger that began last night.  No injury.  Denies any decrease sensation however reports it is constantly tingling.  No other numbness.  Reports occasionally she has upper extremity tingling.  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   Cocaine abuse (HCC) 06/19/2016   Migraine headache without aura 11/16/2014    Past Surgical History:  Procedure Laterality Date   TUBAL LIGATION N/A 01/14/2019   Procedure: POST PARTUM TUBAL LIGATION;  Surgeon: Levie Heritage, DO;  Location: [facility] LD ORS;  Service: Gynecology;  Laterality: N/A;     OB History     Gravida  4   Para  4   Term  4   Preterm  0   AB  0   Living  4      SAB  0   IAB  0   Ectopic  0   Multiple  0   Live Births  4           Family History  Problem Relation Age of Onset   Healthy Mother    Alcohol abuse Neg Hx    Arthritis Neg Hx    Asthma Neg Hx    Birth defects Neg Hx    Cancer Neg Hx    COPD Neg Hx    Depression Neg Hx    Diabetes Neg Hx    Drug abuse Neg Hx    Early death Neg Hx    Hearing loss Neg  Hx    Heart disease Neg Hx    Hyperlipidemia Neg Hx    Hypertension Neg Hx    Kidney disease Neg Hx    Learning disabilities Neg Hx    Mental illness Neg Hx    Mental retardation Neg Hx    Miscarriages / Stillbirths Neg Hx    Stroke Neg Hx    Vision loss Neg Hx    Varicose Veins Neg Hx     Social History   Tobacco Use   Smoking status: Every Day    Packs/day: 0.25    Years: 7.00    Pack years: 1.75    Types: Cigarettes   Smokeless tobacco: Never   Tobacco comments:    refused  Vaping Use   Vaping Use: Never used  Substance Use Topics   Alcohol use: Yes    Alcohol/week: 2.0 standard drinks    Types: 2 Cans of beer per week   Drug use: Not Currently    Types: Cocaine, Marijuana    Home Medications Prior to  Admission medications   Medication Sig Start Date End Date Taking? Authorizing Provider  oseltamivir (TAMIFLU) 75 MG capsule Take 1 capsule (75 mg total) by mouth 2 (two) times daily. 12/03/20   Cathlyn Parsons, NP    Allergies    Patient has no known allergies.  Review of Systems   Review of Systems  Skin:  Negative for wound.  Neurological:  Positive for numbness. Negative for weakness.   Physical Exam Updated Vital Signs BP 118/76 (BP Location: Left Arm)   Pulse 64   Temp 98.4 F (36.9 C) (Oral)   Resp 16   Ht 5\' 4"  (1.626 m)   Wt 71.7 kg   LMP 12/16/2020 (Approximate)   SpO2 94%   BMI 27.12 kg/m   Physical Exam Vitals and nursing note reviewed.  Constitutional:      Appearance: Normal appearance.  HENT:     Head: Normocephalic and atraumatic.  Eyes:     General: No scleral icterus.    Conjunctiva/sclera: Conjunctivae normal.  Pulmonary:     Effort: Pulmonary effort is normal. No respiratory distress.  Musculoskeletal:        General: No swelling, tenderness or signs of injury.  Skin:    General: Skin is warm and dry.     Findings: No lesion or rash.  Neurological:     General: No focal deficit present.     Mental Status: She is  alert.     Sensory: No sensory deficit.     Motor: No weakness.  Psychiatric:        Mood and Affect: Mood normal.        Behavior: Behavior normal.    ED Results / Procedures / Treatments   Labs (all labs ordered are listed, but only abnormal results are displayed) Labs Reviewed - No data to display  EKG None  Radiology No results found.  Procedures Procedures   Medications Ordered in ED Medications - No data to display  ED Course  I have reviewed the triage vital signs and the nursing notes.  Pertinent labs & imaging results that were available during my care of the patient were reviewed by me and considered in my medical decision making (see chart for details).    MDM Rules/Calculators/A&P Per chart review patient was seen in a virtual visit earlier today and instructed to go have an in person exam.  Tingling is from the PIP to the tip of her finger.  No other areas of involvement.  Does not appear to have trigger finger, full range of motion and sensation with the digit.  At this time I believe the patient is stable to be reevaluated by primary care.  This will likely resolve however she may need to use her hands lasts for the next few days.  She is frustrated but agreeable to this plan.  Final Clinical Impression(s) / ED Diagnoses Final diagnoses:  Finger numbness    Rx / DC Orders Results and diagnoses were explained to the patient. Return precautions discussed in full. Patient had no additional questions and expressed complete understanding.     14/08/2020 12/18/20 1508    14/10/22, MD 12/18/20 [phone]

## 2020-12-18 NOTE — Discharge Instructions (Addendum)
Follow-up with the office attached to these papers if your numbness continues.  There is no imaging for [place] to do today.  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  It may be a good idea to ice the area and rest the hand is much as you can.

## 2020-12-18 NOTE — Patient Instructions (Signed)
Due to progressive numbness in pointer finger I encouraged patient to evaluated in person.  Patient will go to nearest in person facility to be evaluated.

## 2020-12-18 NOTE — ED Triage Notes (Signed)
Reports R index finger numbness since 7:30 pm last night, denies trauma or issues. Distal tip numbness. Hx of anemia, currently on menstrual.

## 2020-12-18 NOTE — Progress Notes (Signed)
Virtual Visit Consent   Nancy Henson, you are scheduled for a virtual visit with a [facility] provider today.     Just as with appointments in the office, your consent must be obtained to participate.  Your consent will be active for this visit and any virtual visit you may have with one of our providers in the next 365 days.     If you have a MyChart account, a copy of this consent can be sent to you electronically.  All virtual visits are billed to your insurance company just like a traditional visit in the office.    As this is a virtual visit, video technology does not allow for your provider to perform a traditional examination.  This may limit your provider's ability to fully assess your condition.  If your provider identifies any concerns that need to be evaluated in person or the need to arrange testing (such as labs, EKG, etc.), we will make arrangements to do so.     Although advances in technology are sophisticated, we cannot ensure that it will always work on either your end or our end.  If the connection with a video visit is poor, the visit may have to be switched to a telephone visit.  With either a video or telephone visit, we are not always able to ensure that we have a secure connection.     I need to obtain your verbal consent now.   Are you willing to proceed with your visit today? Yes   Nancy Henson has provided verbal consent on 12/18/2020 for a virtual visit (video or telephone).   Rennis Harding, [place]   Date: 12/18/2020 11:40 AM   Virtual Visit via Video Note   I, Rennis Harding, connected with  Nancy Henson  ([id number], [birth year]) on 12/18/20 at 11:30 AM EST by a video-enabled telemedicine application and verified that I am speaking with the correct person using two identifiers.  Location: Patient: Virtual Visit Location Patient: Home Provider: Virtual Visit Location Provider: Home Office   I discussed the limitations of evaluation and  management by telemedicine and the availability of in person appointments. The patient expressed understanding and agreed to proceed.    History of Present Illness: Nancy Henson is a 25 y.o. who identifies as a female who was assigned female at birth, and is being seen today for numbness in RT hand pointer finger x 1 day.  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  Describes as constant and decreased in sensation.   She has NOT tried OTC medications without relief.  Denies aggravating factors.  Does admit to sensation into proximal finger.  She denies similar symptoms in the past.   Denies fever, chills, nausea, vomiting, redness, swelling, discharge, wound, facial droop, slurred speech, difficulty with movements, numbness/ tingling in other parts of body  Denies pregnancy.    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  Resting comfortably at home.  Head is normocephalic, atraumatic.  No labored breathing.  Speech is clear and coherent with logical content. No slurred speech or facial droop Patient is alert and oriented at baseline.    Assessment and Plan: 1.  Numbness of finger Due to progressive numbness in pointer finger I encouraged patient to evaluated in person.  Patient will go to nearest in person facility to be evaluated.    Follow Up Instructions: I discussed the assessment and treatment plan with the patient. The patient was provided an opportunity to ask questions and all were answered. The patient agreed with the plan and demonstrated an understanding of the instructions.  A copy of instructions were sent to the patient via MyChart unless otherwise noted below.   The patient was advised to call back or seek an in-person evaluation if the symptoms worsen or if the condition fails to improve as anticipated.  Time:  I spent 5-10 minutes with the patient via telehealth technology discussing the above problems/concerns.    Rennis Harding, PA-C

## 2020-12-24 ENCOUNTER — Telehealth: Payer: Medicaid Other | Admitting: Physician Assistant

## 2020-12-24 DIAGNOSIS — R202 Paresthesia of skin: Secondary | ICD-10-CM

## 2020-12-24 MED ORDER — PREDNISONE 20 MG PO TABS: 40.0000 mg | ORAL_TABLET | Freq: Every day | ORAL | 0 refills | Status: DC

## 2020-12-24 NOTE — Progress Notes (Signed)
Virtual Visit Consent   Nancy Henson, you are scheduled for a virtual visit with a [facility] provider today.     Just as with appointments in the office, your consent must be obtained to participate.  Your consent will be active for this visit and any virtual visit you may have with one of our providers in the next 365 days.     If you have a MyChart account, a copy of this consent can be sent to you electronically.  All virtual visits are billed to your insurance company just like a traditional visit in the office.    As this is a virtual visit, video technology does not allow for your provider to perform a traditional examination.  This may limit your provider's ability to fully assess your condition.  If your provider identifies any concerns that need to be evaluated in person or the need to arrange testing (such as labs, EKG, etc.), we will make arrangements to do so.     Although advances in technology are sophisticated, we cannot ensure that it will always work on either your end or our end.  If the connection with a video visit is poor, the visit may have to be switched to a telephone visit.  With either a video or telephone visit, we are not always able to ensure that we have a secure connection.     I need to obtain your verbal consent now.   Are you willing to proceed with your visit today?    Nancy Henson has provided verbal consent on 12/24/2020 for a virtual visit (video or telephone).   Margaretann Loveless, PA-C   Date: 12/24/2020 1:06 PM   Virtual Visit via Video Note   I, Margaretann Loveless, connected with  Nancy Henson  ([id number], Jan 13, 1995) on 12/24/20 at  1:00 PM EST by a video-enabled telemedicine application and verified that I am speaking with the correct person using two identifiers.  Location: Patient: Virtual Visit Location Patient: Home Provider: Virtual Visit Location Provider: Home Office   I discussed the limitations of evaluation and  management by telemedicine and the availability of in person appointments. The patient expressed understanding and agreed to proceed.    History of Present Illness: Nancy Henson is a 25 y.o. who identifies as a female who was assigned female at birth, and is being seen today for finger numbness. Was seen on 12/18/20 virtually and then in person at ER. 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 The numbness has not changed. It is still present from the PIP joint to the tip of the finger on the right index finger.   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  Resting comfortably  at home.  Head is normocephalic, atraumatic.  No labored breathing.  Speech is clear and coherent with logical content.  Patient is alert and oriented at baseline.    Assessment and Plan: 1. Paresthesia of finger - predniSONE (DELTASONE) 20 MG tablet; Take 2 tablets (40 mg total) by mouth daily with breakfast.  Dispense: 14 tablet; Refill: 0  - Will add prednisone as above to see if can help lessen any  inflammation that may be around the nerve - Advised paresthesias can last for 4-6 weeks sometimes - Seek in person evaluation if symptoms fail to improve or if symptoms worsen  Follow Up Instructions: I discussed the assessment and treatment plan with the patient. The patient was provided an opportunity to ask questions and all were answered. The patient agreed with the plan and demonstrated an understanding of the instructions.  A copy of instructions were sent to the patient via MyChart unless otherwise noted below.    The patient was advised to call back or seek an in-person evaluation if the symptoms worsen or if the condition fails to improve as anticipated.  Time:  I spent 11 minutes with the patient via telehealth technology discussing the above problems/concerns.    Margaretann Loveless, PA-C

## 2020-12-24 NOTE — Patient Instructions (Signed)
Nancy Henson, thank you for joining Margaretann Loveless, PA-C for today's virtual visit.  While this provider is not your primary care provider (PCP), if your PCP is located in our provider database this encounter information will be shared with them immediately following your visit.  Consent: (Patient) Nancy Henson provided verbal consent for this virtual visit at the beginning of the encounter.  Current Medications:  Current Outpatient Medications:    predniSONE (DELTASONE) 20 MG tablet, Take 2 tablets (40 mg total) by mouth daily with breakfast., Disp: 14 tablet, Rfl: 0   oseltamivir (TAMIFLU) 75 MG capsule, Take 1 capsule (75 mg total) by mouth 2 (two) times daily., Disp: 10 capsule, Rfl: 0   Medications ordered in this encounter:  Meds ordered this encounter  Medications   predniSONE (DELTASONE) 20 MG tablet    Sig: Take 2 tablets (40 mg total) by mouth daily with breakfast.    Dispense:  14 tablet    Refill:  0    Order Specific Question:   Supervising Provider    Answer:   Eber Hong [[id number]]     *If you need refills on other medications prior to your next appointment, please contact your pharmacy*  Follow-Up: Call back or seek an in-person evaluation if the symptoms worsen or if the condition fails to improve as anticipated.  Other Instructions Paresthesia Paresthesia is a burning or prickling feeling. This feeling can happen in any part of the body. It often happens in the hands, arms, legs, or feet. Usually, it is not painful. In most cases, the feeling goes away in a short time and is not a sign of a serious problem. If you have paresthesia that lasts a long time, you need to see your doctor. Follow these instructions at home: Nutrition Eat a healthy diet. This includes: Eating foods that are high in fiber. These include beans, whole grains, and fresh fruits and vegetables. Limiting foods that are high in fat and sugar. These include fried or sweet  foods.  Alcohol use  Do not drink alcohol if: Your doctor tells you not to drink. You are pregnant, may be pregnant, or are planning to become pregnant. If you drink alcohol: Limit how much you have to: 0-1 drink a day for women. 0-2 drinks a day for men. Know how much alcohol is in your drink. In the [place] one drink equals one 12 oz bottle of beer (355 mL), one 5 oz glass of wine (148 mL), or one 1 oz glass of hard liquor (44 mL). General instructions Take over-the-counter and prescription medicines only as told by your doctor. Do not smoke or use any products that contain nicotine or tobacco. If you need help quitting, ask your doctor. If you have diabetes, work with your doctor to make sure your blood sugar stays in a healthy range. If your feet feel numb: Check for redness, warmth, and swelling every day. Wear padded socks and comfortable shoes. These help protect your feet. Keep all follow-up visits. Contact a doctor if: You have paresthesia that gets worse or does not go away. You lose feeling (have numbness) after an injury. Your burning or prickling feeling gets worse when you walk. You have pain or cramps. You feel dizzy or you faint. You have a rash. Get help right away if: You feel weak or have new weakness in an arm or leg. You have trouble walking or moving. You have problems speaking, understanding, or seeing. You feel confused. You cannot  control when you pee (urinate) or poop (have a bowel movement). These symptoms may be an emergency. Get help right away. Call 911. Do not wait to see if the symptoms will go away. Do not drive yourself to the hospital. Summary Paresthesia is a burning or prickling feeling. It often happens in the hands, arms, legs, or feet. In most cases, the feeling goes away in a short time and is not a sign of a serious problem. If you have paresthesia that lasts a long time, you need to be seen by your doctor. This information is not  intended to replace advice given to you by your health care provider. Make sure you discuss any questions you have with your health care provider. Document Revised: 09/06/2020 Document Reviewed: 09/06/2020 Elsevier Patient Education  2022 [employer].    If you have been instructed to have an in-person evaluation today at a local Urgent Care facility, please use the link below. It will take you to a list of all of our available [facility], including address, phone number and hours of operation. Please do not delay care.  [facility]  If you or a family member do not have a primary care provider, use the link below to schedule a visit and establish care. When you choose a [facility] primary care physician or advanced practice provider, you gain a long-term partner in health. Find a Primary Care Provider  Learn more about [facility] in-office and virtual care options: [facility] - Get Care Now

## 2021-05-17 ENCOUNTER — Telehealth: Payer: Medicaid Other | Admitting: Physician Assistant

## 2021-05-17 ENCOUNTER — Encounter: Payer: Self-pay | Admitting: Physician Assistant

## 2021-05-17 DIAGNOSIS — B3731 Acute candidiasis of vulva and vagina: Secondary | ICD-10-CM

## 2021-05-17 MED ORDER — FLUCONAZOLE 150 MG PO TABS: 150.0000 mg | ORAL_TABLET | Freq: Once | ORAL | 0 refills | Status: AC

## 2021-05-17 NOTE — Patient Instructions (Signed)
?Nancy Henson, thank you for joining Piedad Climes, PA-C for today's virtual visit.  While this provider is not your primary care provider (PCP), if your PCP is located in our provider database this encounter information will be shared with them immediately following your visit. ? ?Consent: ?(Patient) Nancy Henson provided verbal consent for this virtual visit at the beginning of the encounter. ? ?Current Medications: ?No current outpatient medications on file.  ? ?Medications ordered in this encounter:  ?No orders of the defined types were placed in this encounter. ?  ? ?*If you need refills on other medications prior to your next appointment, please contact your pharmacy* ? ?Follow-Up: ?Call back or seek an in-person evaluation if the symptoms worsen or if the condition fails to improve as anticipated. ? ?Other Instructions ?Please take the Diflucan as directed. ?If not resolving, please be evaluated in person as discussed.  ? ?Vaginal Yeast Infection, Adult ? ?Vaginal yeast infection is a condition that causes vaginal discharge as well as soreness, swelling, and redness (inflammation) of the vagina. This is a common condition. Some women get this infection frequently. ?What are the causes? ?This condition is caused by a change in the normal balance of the yeast (Candida) and normal bacteria that live in the vagina. This change causes an overgrowth of yeast, which causes the inflammation. ?What increases the risk? ?The condition is more likely to develop in women who: ?Take antibiotic medicines. ?Have diabetes. ?Take birth control pills. ?Are pregnant. ?Douche often. ?Have a weak body defense system (immune system). ?Have been taking steroid medicines for a long time. ?Frequently wear tight clothing. ?What are the signs or symptoms? ?Symptoms of this condition include: ?White, thick, creamy vaginal discharge. ?Swelling, itching, redness, and irritation of the vagina. The lips of the vagina  (labia) may be affected as well. ?Pain or a burning feeling while urinating. ?Pain during sex. ?How is this diagnosed? ?This condition is diagnosed based on: ?Your medical history. ?A physical exam. ?A pelvic exam. Your health care provider will examine a sample of your vaginal discharge under a microscope. Your health care provider may send this sample for testing to confirm the diagnosis. ?How is this treated? ?This condition is treated with medicine. Medicines may be over-the-counter or prescription. You may be told to use one or more of the following: ?Medicine that is taken by mouth (orally). ?Medicine that is applied as a cream (topically). ?Medicine that is inserted directly into the vagina (suppository). ?Follow these instructions at home: ?Take or apply over-the-counter and prescription medicines only as told by your health care provider. ?Do not use tampons until your health care provider approves. ?Do not have sex until your infection has cleared. Sex can prolong or worsen your symptoms of infection. Ask your health care provider when it is safe to resume sexual activity. ?Keep all follow-up visits. This is important. ?How is this prevented? ? ?Do not wear tight clothes, such as pantyhose or tight pants. ?Wear breathable cotton underwear. ?Do not use douches, perfumed soap, creams, or powders. ?Wipe from front to back after using the toilet. ?If you have diabetes, keep your blood sugar levels under control. ?Ask your health care provider for other ways to prevent yeast infections. ?Contact a health care provider if: ?You have a fever. ?Your symptoms go away and then return. ?Your symptoms do not get better with treatment. ?Your symptoms get worse. ?You have new symptoms. ?You develop blisters in or around your vagina. ?You have blood coming from  your vagina and it is not your menstrual period. ?You develop pain in your abdomen. ?Summary ?Vaginal yeast infection is a condition that causes discharge as well  as soreness, swelling, and redness (inflammation) of the vagina. ?This condition is treated with medicine. Medicines may be over-the-counter or prescription. ?Take or apply over-the-counter and prescription medicines only as told by your health care provider. ?Do not douche. Resume sexual activity or use of tampons as instructed by your health care provider. ?Contact a health care provider if your symptoms do not get better with treatment or your symptoms go away and then return. ?This information is not intended to replace advice given to you by your health care provider. Make sure you discuss any questions you have with your health care provider. ?Document Revised: 03/15/2020 Document Reviewed: 03/15/2020 ?Elsevier Patient Education ? 2023 [employer] ? ? ? ?If you have been instructed to have an in-person evaluation today at a local Urgent Care facility, please use the link below. It will take you to a list of all of our available [facility], including address, phone number and hours of operation. Please do not delay care.  ?[facility] ? ?If you or a family member do not have a primary care provider, use the link below to schedule a visit and establish care. When you choose a [facility] primary care physician or advanced practice provider, you gain a long-term partner in health. ?Find a Primary Care Provider ? ?Learn more about [facility]'s in-office and virtual care options: ?[facility] Get Care Now  ?

## 2021-05-17 NOTE — Progress Notes (Signed)
?Virtual Visit Consent  ? ?[name], you are scheduled for a virtual visit with a [facility] provider today. Just as with appointments in the office, your consent must be obtained to participate. Your consent will be active for this visit and any virtual visit you may have with one of our providers in the next 365 days. If you have a MyChart account, a copy of this consent can be sent to you electronically. ? ?As this is a virtual visit, video technology does not allow for your provider to perform a traditional examination. This may limit your provider's ability to fully assess your condition. If your provider identifies any concerns that need to be evaluated in person or the need to arrange testing (such as labs, EKG, etc.), we will make arrangements to do so. Although advances in technology are sophisticated, we cannot ensure that it will always work on either your end or our end. If the connection with a video visit is poor, the visit may have to be switched to a telephone visit. With either a video or telephone visit, we are not always able to ensure that we have a secure connection. ? ?By engaging in this virtual visit, you consent to the provision of healthcare and authorize for your insurance to be billed (if applicable) for the services provided during this visit. Depending on your insurance coverage, you may receive a charge related to this service. ? ?I need to obtain your verbal consent now. Are you willing to proceed with your visit today? Nancy Henson has provided verbal consent on 05/17/2021 for a virtual visit (video or telephone). Nancy Climes, PA-C ? ?Date: 05/17/2021 1:11 PM ? ?Virtual Visit via Video Note  ? ?IPiedad Henson, connected with  ADONIS RYTHER  ([id number], [birth year]) on 05/17/21 at  1:00 PM EDT by a video-enabled telemedicine application and verified that I am speaking with the correct person using two identifiers. ? ?Location: ?Patient: Virtual Visit  Location Patient: Home ?Provider: Virtual Visit Location Provider: Home Office ?  ?I discussed the limitations of evaluation and management by telemedicine and the availability of in person appointments. The patient expressed understanding and agreed to proceed.   ? ?History of Present Illness: ?Nancy Henson is a 26 y.o. who identifies as a female who was assigned female at birth, and is being seen today for possible yeast infection. Notes over the past week or so having some very thick vaginal discharge, itching as well. Denies concern for STI. LMP was 3.5 weeks ago. Denies any concerns for pregnancy.  ? ?HPI: HPI  ?Problems:  ?Patient Active Problem List  ? Diagnosis Date Noted  ? Intrauterine pregnancy 01/14/2019  ? Decreased fetal movements affecting management of mother, antepartum 12/24/2018  ? Unwanted fertility 11/04/2018  ? Supervision of high risk pregnancy, antepartum 06/06/2018  ? History of substance abuse (HCC) 06/06/2018  ? 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 Cocaine abuse (HCC) 06/19/2016  ? Migraine headache without aura 11/16/2014  ?  ?Allergies: No Known Allergies ?Medications:  ?Current Outpatient Medications:  ?  fluconazole (DIFLUCAN) 150 MG tablet, Take 1 tablet (150 mg total) by mouth once for 1 dose. May repeat in 3 days, Disp: 2 tablet, Rfl: 0 ? ?Observations/Objective: ?Patient is well-developed, well-nourished in no acute distress.  ?Resting comfortably at home.  ?Head is normocephalic, atraumatic.  ?No labored breathing. ?Speech is clear and coherent with logical content.  ?Patient is alert and oriented  at baseline.  ? ?Assessment and Plan: ?1. Yeast vaginitis ?- fluconazole (DIFLUCAN) 150 MG tablet; Take 1 tablet (150 mg total) by mouth once for 1 dose. May repeat in 3 days  Dispense: 2 tablet; Refill: 0 ? ?Classic yeast symptoms. No alarm signs/symptoms noted. Rx Diflucan x 2 doses. Supportive measures reviewed. Will require  an in-person assessment if not resolving.  ? ?Follow Up Instructions: ?I discussed the assessment and treatment plan with the patient. The patient was provided an opportunity to ask questions and all were answered. The patient agreed with the plan and demonstrated an understanding of the instructions.  A copy of instructions were sent to the patient via MyChart unless otherwise noted below.  ? ? ?The patient was advised to call back or seek an in-person evaluation if the symptoms worsen or if the condition fails to improve as anticipated. ? ?Time:  ?I spent 10 minutes with the patient via telehealth technology discussing the above problems/concerns.   ? ?Nancy Climes, PA-C ?

## 2021-09-09 ENCOUNTER — Encounter (HOSPITAL_COMMUNITY): Payer: Self-pay

## 2021-09-09 ENCOUNTER — Ambulatory Visit (HOSPITAL_COMMUNITY)
Admission: EM | Admit: 2021-09-09 | Discharge: 2021-09-09 | Disposition: A | Discharge status: Home / Self Care | Payer: Medicaid Other | Attending: Family Medicine | Admitting: Family Medicine

## 2021-09-09 DIAGNOSIS — R430 Anosmia: Secondary | ICD-10-CM

## 2021-09-09 DIAGNOSIS — R051 Acute cough: Secondary | ICD-10-CM

## 2021-09-09 DIAGNOSIS — Z20822 Contact with and (suspected) exposure to covid-19: Secondary | ICD-10-CM

## 2021-09-09 NOTE — ED Provider Notes (Signed)
[facility]    CSN: [id number] Arrival date & time: 09/09/21  6295      History   Chief Complaint Chief Complaint  Patient presents with   Covid Exposure   URI    HPI Nancy Henson is a 26 y.o. female.   She was exposed to covid on Monday.  On Tuesday she stated with runny nose, tickle in her throat.  The next day started with cough, congestion.  No fevers or chills.  Very tired, body felt week.   No meds taken.   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   Cocaine abuse (HCC) 06/19/2016   Migraine headache without aura 11/16/2014    Past Surgical History:  Procedure Laterality Date   TUBAL LIGATION N/A 01/14/2019   Procedure: POST PARTUM TUBAL LIGATION;  Surgeon: Levie Heritage, DO;  Location: [facility];  Service: Gynecology;  Laterality: N/A;    OB History     Gravida  4   Para  4   Term  4   Preterm  0   AB  0   Living  4      SAB  0   IAB  0   Ectopic  0   Multiple  0   Live Births  4            Home Medications    Prior to Admission medications   Not on File    Family History Family History  Problem Relation Age of Onset   Healthy Mother    Alcohol abuse Neg Hx    Arthritis Neg Hx    Asthma Neg Hx    Birth defects Neg Hx    Cancer Neg Hx    COPD Neg Hx    Depression Neg Hx    Diabetes Neg Hx    Drug abuse Neg Hx    Early death Neg Hx    Hearing loss Neg Hx    Heart disease Neg Hx    Hyperlipidemia Neg Hx    Hypertension Neg Hx     Kidney disease Neg Hx    Learning disabilities Neg Hx    Mental illness Neg Hx    Mental retardation Neg Hx    Miscarriages / Stillbirths Neg Hx    Stroke Neg Hx    Vision loss Neg Hx    Varicose Veins Neg Hx     Social History Social History   Tobacco Use   Smoking status: Every Day    Packs/day: 0.25    Years: 7.00    Total pack years: 1.75    Types: Cigarettes   Smokeless tobacco: Never   Tobacco comments:    refused  Vaping Use   Vaping Use: Never used  Substance Use Topics   Alcohol use: Yes    Alcohol/week: 2.0 standard drinks of alcohol    Types: 2 Cans of beer per week   Drug use: Not Currently    Types: Cocaine, Marijuana  Allergies   Patient has no known allergies.   Review of Systems Review of Systems  Constitutional:  Positive for fatigue. Negative for chills and fever.  HENT:  Positive for congestion and sore throat.   Respiratory:  Positive for cough.   Cardiovascular: Negative.   Gastrointestinal: Negative.   Genitourinary: Negative.   Musculoskeletal: Negative.   Psychiatric/Behavioral: Negative.       Physical Exam Triage Vital Signs ED Triage Vitals  Enc Vitals Group     BP 09/09/21 1030 119/71     Pulse Rate 09/09/21 1030 74     Resp 09/09/21 1030 16     Temp 09/09/21 1030 98.2 F (36.8 C)     Temp Source 09/09/21 1030 Oral     SpO2 09/09/21 1030 99 %     Weight 09/09/21 1031 159 lb (72.1 kg)     Height 09/09/21 1031 5\' 4"  (1.626 m)     Head Circumference --      Peak Flow --      Pain Score 09/09/21 1031 0     Pain Loc --      Pain Edu? --      Excl. in GC? --    No data found.  Updated Vital Signs BP 119/71 (BP Location: Left Arm)   Pulse 74   Temp 98.2 F (36.8 C) (Oral)   Resp 16   Ht 5\' 4"  (1.626 m)   Wt 72.1 kg   LMP 08/13/2021 (Exact Date)   SpO2 99%   BMI 27.29 kg/m   Visual Acuity Right Eye Distance:   Left Eye Distance:   Bilateral Distance:    Right Eye Near:   Left Eye Near:    Bilateral  Near:     Physical Exam Constitutional:      Appearance: Normal appearance.  HENT:     Head: Normocephalic.     Nose: Congestion present.  Cardiovascular:     Rate and Rhythm: Normal rate and regular rhythm.  Pulmonary:     Effort: Pulmonary effort is normal.     Breath sounds: Normal breath sounds. No wheezing.  Musculoskeletal:     Cervical back: Normal range of motion.  Skin:    General: Skin is warm.  Neurological:     General: No focal deficit present.     Mental Status: She is alert.  Psychiatric:        Mood and Affect: Mood normal.      UC Treatments / Results  Labs (all labs ordered are listed, but only abnormal results are displayed) Labs Reviewed  SARS CORONAVIRUS 2 (TAT 6-24 HRS)    EKG   Radiology No results found.  Procedures Procedures (including critical care time)  Medications Ordered in UC Medications - No data to display  Initial Impression / Assessment and Plan / UC Course  I have reviewed the triage vital signs and the nursing notes.  Pertinent labs & imaging results that were available during my care of the patient were reviewed by me and considered in my medical decision making (see chart for details).   Final Clinical Impressions(s) / UC Diagnoses   Final diagnoses:  Exposure to COVID-19 virus  Acute cough  Loss of smell     Discharge Instructions      You were seen today for upper respiratory symptoms after exposure to covid 19.  Your test will be resulted by tomorrow.  You can see this on mychart, but if positive we will call and  notify you.  I recommend over the counter medication such as mucinex and tylenol to help with your symptoms.  If you worsen then please return for re-evaluation.     ED Prescriptions   None    PDMP not reviewed this encounter.   Rondel Oh, MD 09/09/21 1100

## 2021-09-09 NOTE — Discharge Instructions (Signed)
You were seen today for upper respiratory symptoms after exposure to covid 19.  Your test will be resulted by tomorrow.  You can see this on mychart, but if positive we will call and notify you.  I recommend over the counter medication such as mucinex and tylenol to help with your symptoms.  If you worsen then please return for re-evaluation.

## 2021-09-09 NOTE — ED Triage Notes (Signed)
Patient having symptoms onset 4 days. Patient having congestion, cough, tickle in the throat, loss of smell.   Patient was exposed to [facility] Wednesday. 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

## 2021-09-10 LAB — SARS CORONAVIRUS 2 (TAT 6-24 HRS): SARS Coronavirus 2: NEGATIVE

## 2022-04-04 ENCOUNTER — Other Ambulatory Visit: Payer: Self-pay

## 2022-04-04 ENCOUNTER — Emergency Department (HOSPITAL_COMMUNITY)
Admission: EM | Admit: 2022-04-04 | Discharge: 2022-04-04 | Disposition: A | Discharge status: Home / Self Care | Payer: Self-pay | Attending: Emergency Medicine | Admitting: Emergency Medicine

## 2022-04-04 DIAGNOSIS — J020 Streptococcal pharyngitis: Secondary | ICD-10-CM | POA: Insufficient documentation

## 2022-04-04 DIAGNOSIS — R000 Tachycardia, unspecified: Secondary | ICD-10-CM | POA: Insufficient documentation

## 2022-04-04 LAB — GROUP A STREP BY PCR: Group A Strep by PCR: DETECTED — AB

## 2022-04-04 MED ORDER — DEXAMETHASONE 4 MG PO TABS
10.0000 mg | ORAL_TABLET | Freq: Once | ORAL | Status: AC
  Administered 2022-04-04: 10 mg via ORAL
  Filled 2022-04-04: qty 2

## 2022-04-04 MED ORDER — PENICILLIN G BENZATHINE 1200000 UNIT/2ML IM SUSY
1.2000 10*6.[IU] | PREFILLED_SYRINGE | Freq: Once | INTRAMUSCULAR | Status: AC
  Administered 2022-04-04: 1.2 10*6.[IU] via INTRAMUSCULAR
  Filled 2022-04-04: qty 2

## 2022-04-04 NOTE — Discharge Instructions (Signed)
You can continue to take Tylenol or ibuprofen or benzocaine lozenges or spray as needed for the sore throat.  Your symptoms should start improving in the next 1 to 2 days.  Make sure you change your toothbrush in the next 2 to 3 days.

## 2022-04-04 NOTE — ED Triage Notes (Signed)
C/o sore throat, bodyaches, chills, headache, and nausea.  Inflammation noted to throat.

## 2022-04-04 NOTE — ED Provider Notes (Signed)
[place] EMERGENCY DEPARTMENT AT [facility] Provider Note   CSN: [id number] Arrival date & time: 04/04/22  [id number]     History  Chief Complaint  Patient presents with   Sore Throat    Nancy Henson is a 27 y.o. female.  Patient is a 27 year old female with no known medical problems presenting today with severe sore throat.  Patient reports symptoms started yesterday and have gradually worsened.  She has had myalgias, subjective fever and significant pain with swallowing.  She does report last week she had a viral illness with cough congestion and headache but that resolved and the sore throat started yesterday.  She denies any shortness of breath, cough or nasal congestion today.  She has no known drug allergies  The history is provided by the patient.  Sore Throat       Home Medications Prior to Admission medications   Not on File      Allergies    Patient has no known allergies.    Review of Systems   Review of Systems  Physical Exam Updated Vital Signs BP 128/85 (BP Location: Left Arm)   Pulse (!) 116   Temp 99.1 F (37.3 C) (Oral)   Resp 16   Ht 5\' 4"  (1.626 m)   Wt 72.6 kg   SpO2 100%   BMI 27.46 kg/m  Physical Exam Vitals and nursing note reviewed.  Constitutional:      General: She is not in acute distress.    Appearance: She is well-developed.  HENT:     Head: Normocephalic and atraumatic.     Right Ear: Tympanic membrane normal.     Left Ear: Tympanic membrane normal.     Mouth/Throat:     Mouth: Mucous membranes are moist.     Pharynx: Posterior oropharyngeal erythema present.     Tonsils: Tonsillar exudate present. No tonsillar abscesses.  Eyes:     Pupils: Pupils are equal, round, and reactive to light.  Cardiovascular:     Rate and Rhythm: Regular rhythm. Tachycardia present.     Heart sounds: Normal heart sounds. No murmur heard.    No friction rub.  Pulmonary:     Effort: Pulmonary effort is normal.     Breath  sounds: Normal breath sounds. No wheezing or rales.  Abdominal:     General: Bowel sounds are normal. There is no distension.     Palpations: Abdomen is soft.     Tenderness: There is no abdominal tenderness. There is no guarding or rebound.  Musculoskeletal:        General: No tenderness. Normal range of motion.     Comments: No edema  Lymphadenopathy:     Cervical: Cervical adenopathy present.  Skin:    General: Skin is warm and dry.     Findings: No rash.  Neurological:     Mental Status: She is alert and oriented to person, place, and time. Mental status is at baseline.     Cranial Nerves: No cranial nerve deficit.  Psychiatric:        Behavior: Behavior normal.     ED Results / Procedures / Treatments   Labs (all labs ordered are listed, but only abnormal results are displayed) Labs Reviewed  GROUP A STREP BY PCR - Abnormal; Notable for the following components:      Result Value   Group A Strep by PCR DETECTED (*)    All other components within normal limits    EKG None  Radiology No results found.  Procedures Procedures    Medications Ordered in ED Medications  penicillin g benzathine (BICILLIN LA) 1200000 UNIT/2ML injection 1.2 Million Units (has no administration in time range)  dexamethasone (DECADRON) tablet 10 mg (has no administration in time range)    ED Course/ Medical Decision Making/ A&P                             Medical Decision Making Amount and/or Complexity of Data Reviewed Labs: ordered. Decision-making details documented in ED Course.  Risk Prescription drug management.   Patient presenting today with symptoms concerning for strep pharyngitis today.  24 hours of worsening sore throat with tonsillar exudates subjective fevers and temperature of 99.1 here.  Low suspicion for epiglottitis, peritonsillar abscess or retropharyngeal abscess at this time.  Patient is in no acute distress. Patient is positive for strep.  She was then given  the option of IM penicillin versus amoxicillin and she chose the penicillin.  She was also given Decadron for pain and swelling.  She was given return precautions.        Final Clinical Impression(s) / ED Diagnoses Final diagnoses:  Strep throat    Rx / DC Orders ED Discharge Orders     None         Blanchie Dessert, MD 04/04/22 [phone]

## 2022-04-08 ENCOUNTER — Encounter (HOSPITAL_COMMUNITY): Payer: Self-pay

## 2022-04-08 ENCOUNTER — Emergency Department (HOSPITAL_COMMUNITY)
Admission: EM | Admit: 2022-04-08 | Discharge: 2022-04-08 | Disposition: A | Discharge status: Home / Self Care | Payer: Self-pay | Attending: Emergency Medicine | Admitting: Emergency Medicine

## 2022-04-08 DIAGNOSIS — B3731 Acute candidiasis of vulva and vagina: Secondary | ICD-10-CM | POA: Insufficient documentation

## 2022-04-08 LAB — URINALYSIS, ROUTINE W REFLEX MICROSCOPIC
Bilirubin Urine: NEGATIVE
Glucose, UA: NEGATIVE mg/dL
Hgb urine dipstick: NEGATIVE
Ketones, ur: NEGATIVE mg/dL
Nitrite: NEGATIVE
Protein, ur: 30 mg/dL — AB
Specific Gravity, Urine: 1.019 (ref 1.005–1.030)
pH: 6 (ref 5.0–8.0)

## 2022-04-08 MED ORDER — FLUCONAZOLE 200 MG PO TABS: 200.0000 mg | ORAL_TABLET | Freq: Once | ORAL | 0 refills | Status: AC

## 2022-04-08 NOTE — Discharge Instructions (Signed)
Thank you for letting us take care of you today.  I sent a dose of fluconazole to your pharmacy to treat your yeast infection.  Please take this as prescribed.  Typically, these resolve after a one-time dose of this medication.  If you do not have resolution of her symptoms, you should be reevaluated as we discussed in either the emergency department, an urgent care, or by your primary care or OB/GYN.  If you develop worsening symptoms such as severe abdominal or pelvic pain, fever, vomiting, vaginal bleeding, or other new, concerning symptoms, please return to the nearest emergency department for reevaluation.

## 2022-04-08 NOTE — ED Triage Notes (Signed)
Pt c/o vaginal discharge and itching after receiving penicillin shot 3/26. Denies any new partners.

## 2022-04-08 NOTE — ED Provider Notes (Signed)
[facility] Provider Note   CSN: [id number] Arrival date & time: 04/08/22  [id number]     History  Chief Complaint  Patient presents with   Vaginal Discharge    Nancy Henson is a 27 y.o. female with no past medical history who presents to the ED complaining of white, clumpy vaginal discharge for the last few days with vaginal itching and irritation.  She states that symptoms started after getting a penicillin shot earlier this week to treat strep pharyngitis.  She denies any new sexual partners and states that she has been in a monogamous relationship since 2018 with her husband.  Last STD testing was when she had her last child in 2021.  She denies associated abdominal pain, pelvic pain, fever, chills, nausea, vomiting, or other complaints today.      Home Medications No regular prescription medications  Allergies    Patient has no known allergies.    Review of Systems   Review of Systems  All other systems reviewed and are negative.   Physical Exam Updated Vital Signs BP (!) 150/78   Pulse 74   Temp 98.4 F (36.9 C) (Oral)   Resp 16   Ht 5\' 4"  (1.626 m)   Wt 72.6 kg   LMP 03/11/2022   SpO2 98%   BMI 27.46 kg/m  Physical Exam Vitals and nursing note reviewed.  Constitutional:      General: She is not in acute distress.    Appearance: Normal appearance.  HENT:     Head: Normocephalic and atraumatic.     Mouth/Throat:     Mouth: Mucous membranes are moist.  Eyes:     Conjunctiva/sclera: Conjunctivae normal.  Cardiovascular:     Rate and Rhythm: Normal rate and regular rhythm.     Heart sounds: No murmur heard. Pulmonary:     Effort: Pulmonary effort is normal.     Breath sounds: Normal breath sounds.  Abdominal:     General: Abdomen is flat.     Palpations: Abdomen is soft.     Tenderness: There is no abdominal tenderness. There is no guarding or rebound.  Musculoskeletal:        General: Normal range of  motion.     Cervical back: Neck supple.  Skin:    General: Skin is warm and dry.     Capillary Refill: Capillary refill takes less than 2 seconds.  Neurological:     Mental Status: She is alert. Mental status is at baseline.  Psychiatric:        Mood and Affect: Mood normal.        Behavior: Behavior normal.     ED Results / Procedures / Treatments   Labs (all labs ordered are listed, but only abnormal results are displayed) Labs Reviewed  URINALYSIS, ROUTINE W REFLEX MICROSCOPIC - Abnormal; Notable for the following components:      Result Value   APPearance CLOUDY (*)    Protein, ur 30 (*)    Leukocytes,Ua LARGE (*)    Bacteria, UA FEW (*)    All other components within normal limits    EKG None  Radiology No results found.  Procedures Procedures    Medications Ordered in ED Medications - No data to display  ED Course/ Medical Decision Making/ A&P                             Medical  Decision Making Amount and/or Complexity of Data Reviewed Labs: ordered.  Risk Prescription drug management.   Medical Decision Making:   Nancy Henson is a 27 y.o. female who presented to the ED today with vaginal discharge detailed above.    Patient's presentation is complicated by their history of recent antibiotic use.  Complete initial physical exam performed, notably the patient  was in no acute distress and neurologically intact.  Abdomen soft nontender without rebound, guarding, or peritoneal signs.  Benign physical exam..    Reviewed and confirmed nursing documentation for past medical history, family history, social history.    Initial Assessment:   With the patient's presentation of vaginal discharge, most likely diagnosis is candidal vaginitis. Differential diagnosis includes but is not limited to medication reaction, gonorrhea, chlamydia, PID, trichomoniasis, HIV, UTI, acute abdomen.  This is most consistent with an acute complicated illness  Initial Plan:   Offered STD testing to patient she states that she is in a monogamous relationship she does not desire this at this time and declines Discussed pelvic examination but patient provided picture of the appearance of genitalia vaginal discharge and states that she would like to forego pelvic exam during today's visit Objective evaluation as reviewed    Final Assessment and Plan:   This is a 27 year old female who presents to the ED complaining of vaginal discharge.  Patient noticed this within the last few days describes it as a white, clumpy discharge that began after receiving a penicillin injection to treat strep pharyngitis.  No history of diabetes or immunodeficiency.  Patient is currently in a monogamous relationship and has had no new sexual partners.  She states she is not concerned for STDs.  She declines pelvic examination.  Slightly hypertensive but otherwise vital signs are stable.  Abdomen soft and nontender.  Patient provided photographic evidence of discharge that does appear consistent with candidal vaginitis.  She states that she has had issues with yeast infections before and they have responded well to 1 dose fluconazole.  Will prescribe this but patient is given very strict return precautions if she does not have resolution of symptoms patient agreeable to return as needed. All questions answered and stable for discharge.    Clinical Impression:  1. Yeast vaginitis      Discharge           Final Clinical Impression(s) / ED Diagnoses Final diagnoses:  Yeast vaginitis    Rx / DC Orders ED Discharge Orders          Ordered    fluconazole (DIFLUCAN) 200 MG tablet   Once        04/08/22 1002              Suzzette Righter, PA-C 04/08/22 1004    Regan Lemming, MD 04/09/22 1801

## 2022-04-08 NOTE — ED Notes (Signed)
Patient left before discharge instructions. But MD has spoke with her

## 2022-09-01 ENCOUNTER — Telehealth: Payer: Self-pay | Admitting: Physician Assistant

## 2022-09-01 DIAGNOSIS — B3731 Acute candidiasis of vulva and vagina: Secondary | ICD-10-CM

## 2022-09-01 MED ORDER — FLUCONAZOLE 150 MG PO TABS: 150.0000 mg | ORAL_TABLET | ORAL | 0 refills | Status: DC | PRN

## 2022-09-01 NOTE — Patient Instructions (Addendum)
Nancy Henson, thank you for joining Margaretann Loveless, PA-C for today's virtual visit.  While this provider is not your primary care provider (PCP), if your PCP is located in our provider database this encounter information will be shared with them immediately following your visit.   A [facility] MyChart account gives you access to today's visit and all your visits, tests, and labs performed at [facility] " click here if you don't have a [facility] MyChart account or go to [email or web address]  Consent: (Patient) Nancy Henson provided verbal consent for this virtual visit at the beginning of the encounter.  Current Medications:  Current Outpatient Medications:    fluconazole (DIFLUCAN) 150 MG tablet, Take 1 tablet (150 mg total) by mouth every 3 (three) days as needed., Disp: 2 tablet, Rfl: 0   Medications ordered in this encounter:  Meds ordered this encounter  Medications   fluconazole (DIFLUCAN) 150 MG tablet    Sig: Take 1 tablet (150 mg total) by mouth every 3 (three) days as needed.    Dispense:  2 tablet    Refill:  0    Order Specific Question:   Supervising Provider    Answer:   Merrilee Jansky [id number]     *If you need refills on other medications prior to your next appointment, please contact your pharmacy*  Follow-Up: Call back or seek an in-person evaluation if the symptoms worsen or if the condition fails to improve as anticipated.  [facility] [phone]  Other Instructions Vaginal Probiotics: AZO vaginal probiotic OLLY Happy [employer]oo-Ha RAW Vaginal Care RenewLife Women's vaginal probiotic RepHresh Pro-B Boric Acid suppositories to reset vaginal pH  Vaginal Yeast Infection, Adult  Vaginal yeast infection is a condition that causes vaginal discharge as well as soreness, swelling, and redness (inflammation) of the vagina. This is a common condition. Some women get this infection frequently. What are the  causes? This condition is caused by a change in the normal balance of the yeast (Candida) and normal bacteria that live in the vagina. This change causes an overgrowth of yeast, which causes the inflammation. What increases the risk? The condition is more likely to develop in women who: Take antibiotic medicines. Have diabetes. Take birth control pills. Are pregnant. Douche often. Have a weak body defense system (immune system). Have been taking steroid medicines for a long time. Frequently wear tight clothing. What are the signs or symptoms? Symptoms of this condition include: White, thick, creamy vaginal discharge. Swelling, itching, redness, and irritation of the vagina. The lips of the vagina (labia) may be affected as well. Pain or a burning feeling while urinating. Pain during sex. How is this diagnosed? This condition is diagnosed based on: Your medical history. A physical exam. A pelvic exam. Your health care provider will examine a sample of your vaginal discharge under a microscope. Your health care provider may send this sample for testing to confirm the diagnosis. How is this treated? This condition is treated with medicine. Medicines may be over-the-counter or prescription. You may be told to use one or more of the following: Medicine that is taken by mouth (orally). Medicine that is applied as a cream (topically). Medicine that is inserted directly into the vagina (suppository). Follow these instructions at home: Take or apply over-the-counter and prescription medicines only as told by your health care provider. Do not use tampons until your health care provider approves. Do not have sex until your infection has cleared. Sex can prolong  or worsen your symptoms of infection. Ask your health care provider when it is safe to resume sexual activity. Keep all follow-up visits. This is important. How is this prevented?  Do not wear tight clothes, such as pantyhose or tight  pants. Wear breathable cotton underwear. Do not use douches, perfumed soap, creams, or powders. Wipe from front to back after using the toilet. If you have diabetes, keep your blood sugar levels under control. Ask your health care provider for other ways to prevent yeast infections. Contact a health care provider if: You have a fever. Your symptoms go away and then return. Your symptoms do not get better with treatment. Your symptoms get worse. You have new symptoms. You develop blisters in or around your vagina. You have blood coming from your vagina and it is not your menstrual period. You develop pain in your abdomen. Summary Vaginal yeast infection is a condition that causes discharge as well as soreness, swelling, and redness (inflammation) of the vagina. This condition is treated with medicine. Medicines may be over-the-counter or prescription. Take or apply over-the-counter and prescription medicines only as told by your health care provider. Do not douche. Resume sexual activity or use of tampons as instructed by your health care provider. Contact a health care provider if your symptoms do not get better with treatment or your symptoms go away and then return. This information is not intended to replace advice given to you by your health care provider. Make sure you discuss any questions you have with your health care provider. Document Revised: 03/15/2020 Document Reviewed: 03/15/2020 Elsevier Patient Education  2024 Elsevier Inc.    If you have been instructed to have an in-person evaluation today at a local Urgent Care facility, please use the link below. It will take you to a list of all of our available [facility], including address, phone number and hours of operation. Please do not delay care.  [facility]  If you or a family member do not have a primary care provider, use the link below to schedule a visit and establish care. When you choose a  [facility] primary care physician or advanced practice provider, you gain a long-term partner in health. Find a Primary Care Provider  Learn more about [facility]'s in-office and virtual care options:  - Get Care Now

## 2022-09-01 NOTE — Progress Notes (Signed)
Virtual Visit Consent   CARRIE-ANN INABNIT, you are scheduled for a virtual visit with a [facility] provider today. Just as with appointments in the office, your consent must be obtained to participate. Your consent will be active for this visit and any virtual visit you may have with one of our providers in the next 365 days. If you have a MyChart account, a copy of this consent can be sent to you electronically.  As this is a virtual visit, video technology does not allow for your provider to perform a traditional examination. This may limit your provider's ability to fully assess your condition. If your provider identifies any concerns that need to be evaluated in person or the need to arrange testing (such as labs, EKG, etc.), we will make arrangements to do so. Although advances in technology are sophisticated, we cannot ensure that it will always work on either your end or our end. If the connection with a video visit is poor, the visit may have to be switched to a telephone visit. With either a video or telephone visit, we are not always able to ensure that we have a secure connection.  By engaging in this virtual visit, you consent to the provision of healthcare and authorize for your insurance to be billed (if applicable) for the services provided during this visit. Depending on your insurance coverage, you may receive a charge related to this service.  I need to obtain your verbal consent now. Are you willing to proceed with your visit today? Nancy Henson has provided verbal consent on 09/01/2022 for a virtual visit (video or telephone). Margaretann Loveless, PA-C  Date: 09/01/2022 10:58 AM  Virtual Visit via Video Note   I, Margaretann Loveless, connected with  Nancy Henson  ([id number], May 25, 1995) on 09/01/22 at 10:45 AM EDT by a video-enabled telemedicine application and verified that I am speaking with the correct person using two identifiers.  Location: Patient: Virtual  Visit Location Patient: Home Provider: Virtual Visit Location Provider: Home Office   I discussed the limitations of evaluation and management by telemedicine and the availability of in person appointments. The patient expressed understanding and agreed to proceed.    History of Present Illness: Nancy Henson is a 27 y.o. who identifies as a female who was assigned female at birth, and is being seen today for vaginal itching.  HPI: Vaginal Itching The patient's primary symptoms include genital itching and vaginal discharge. The patient's pertinent negatives include no genital lesions, genital odor, genital rash, missed menses, pelvic pain or vaginal bleeding. This is a new problem. The current episode started in the past 7 days (2-3 days ago). The problem occurs constantly. The problem has been gradually worsening. The patient is experiencing no pain. Pertinent negatives include no abdominal pain, anorexia, back pain, chills, discolored urine, dysuria, fever, frequency, headaches, nausea, painful intercourse or sore throat. The vaginal discharge was white and thick. There has been no bleeding. She has not been passing clots. She has not been passing tissue. The symptoms are aggravated by tactile pressure. Treatments tried: AZO. The treatment provided no relief.     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  Resting comfortably at home.  Head is normocephalic, atraumatic.  No labored breathing.  Speech is clear and coherent with logical content.  Patient is alert and oriented at baseline.    Assessment and Plan: 1. Yeast vaginitis - fluconazole (DIFLUCAN) 150 MG tablet; Take 1 tablet (150 mg total) by mouth every 3 (three) days as needed.  Dispense: 2 tablet; Refill: 0  - Symptoms consistent with yeast vaginitis - Fluconazole prescribed - Limit bubble baths, scented lotions/soaps/detergents - Limit tight fitting clothing - Seek on person evaluation if not improving or if symptoms worsen   Follow Up Instructions: I discussed the assessment and treatment plan with the patient. The patient was provided an opportunity to ask questions and all were answered. The patient agreed with the plan and demonstrated an understanding of the instructions.  A copy of instructions were sent to the patient via MyChart unless otherwise noted below.    The patient was advised to call back or seek an in-person evaluation if the symptoms worsen or if the condition fails to improve as anticipated.  Time:  I spent 10 minutes with the patient via telehealth technology discussing the above problems/concerns.    Margaretann Loveless, PA-C

## 2023-01-15 ENCOUNTER — Telehealth: Payer: 59 | Admitting: Physician Assistant

## 2023-01-15 DIAGNOSIS — B379 Candidiasis, unspecified: Secondary | ICD-10-CM

## 2023-01-15 DIAGNOSIS — T3695XA Adverse effect of unspecified systemic antibiotic, initial encounter: Secondary | ICD-10-CM | POA: Diagnosis not present

## 2023-01-15 DIAGNOSIS — K047 Periapical abscess without sinus: Secondary | ICD-10-CM | POA: Diagnosis not present

## 2023-01-15 MED ORDER — AMOXICILLIN-POT CLAVULANATE 875-125 MG PO TABS: 1.0000 | ORAL_TABLET | Freq: Two times a day (BID) | ORAL | 0 refills | Status: DC

## 2023-01-15 MED ORDER — FLUCONAZOLE 150 MG PO TABS: 150.0000 mg | ORAL_TABLET | ORAL | 0 refills | Status: DC | PRN

## 2023-01-15 NOTE — Patient Instructions (Signed)
 Nancy Henson, thank you for joining Delon [name], PA-C for today's virtual visit.  While this provider is not your primary care provider (PCP), if your PCP is located in our provider database this encounter information will be shared with them immediately following your visit.   A [facility] MyChart account gives you access to today's visit and all your visits, tests, and labs performed at [facility]  click here if you don't have a [facility] MyChart account or go to [email or web address]  Consent: (Patient) Nancy Henson provided verbal consent for this virtual visit at the beginning of the encounter.  Current Medications:  Current Outpatient Medications:    amoxicillin -clavulanate (AUGMENTIN ) 875-125 MG tablet, Take 1 tablet by mouth 2 (two) times daily., Disp: 20 tablet, Rfl: 0   fluconazole  (DIFLUCAN ) 150 MG tablet, Take 1 tablet (150 mg total) by mouth every 3 (three) days as needed., Disp: 3 tablet, Rfl: 0   Medications ordered in this encounter:  Meds ordered this encounter  Medications   amoxicillin -clavulanate (AUGMENTIN ) 875-125 MG tablet    Sig: Take 1 tablet by mouth 2 (two) times daily.    Dispense:  20 tablet    Refill:  0    Supervising Provider:   LAMPTEY, PHILIP O [[id number]]   fluconazole  (DIFLUCAN ) 150 MG tablet    Sig: Take 1 tablet (150 mg total) by mouth every 3 (three) days as needed.    Dispense:  3 tablet    Refill:  0    Supervising Provider:   LAMPTEY, PHILIP O [[id number]]     *If you need refills on other medications prior to your next appointment, please contact your pharmacy*  Follow-Up: Call back or seek an in-person evaluation if the symptoms worsen or if the condition fails to improve as anticipated.  [facility] [phone]  Other Instructions Dental Abscess  A dental abscess is an infection around a tooth that may involve pain, swelling, and a collection of pus, as well as other symptoms.  Treatment is important to help with symptoms and to prevent the infection from spreading. The general types of dental abscesses are: Pulpal abscess. This abscess may form from the inner part of the tooth (pulp). Periodontal abscess. This abscess may form from the gum. What are the causes? This condition is caused by a bacterial infection in or around the tooth. It may result from: Severe tooth decay (cavities). Trauma to the tooth, such as a broken or chipped tooth. What increases the risk? This condition is more likely to develop in males. It is also more likely to develop in people who: Have cavities. Have severe gum disease. Eat sugary snacks between meals. Use tobacco products. Have diabetes. Have a weakened disease-fighting system (immune system). Do not brush and care for their teeth regularly. What are the signs or symptoms? Mild symptoms of this condition include: Tenderness. Bad breath. Fever. A bitter taste in the mouth. Pain in and around the infected tooth. Moderate symptoms of this condition include: Swollen neck glands. Chills. Pus drainage. Swelling and redness around the infected tooth, in the mouth, or in the face. Severe pain in and around the infected tooth. Severe symptoms of this condition include: Difficulty swallowing. Difficulty opening the mouth. Nausea. Vomiting. How is this diagnosed? This condition is diagnosed based on: Your symptoms and your medical and dental history. An examination of the infected tooth. During the exam, your dental care provider may tap on the infected tooth. You may also  need to have X-rays taken of the affected area. How is this treated? This condition is treated by getting rid of the infection. This may be done with: Antibiotic medicines. These may be used in certain situations. Antibacterial mouth rinse. Incision and drainage. This procedure is done by making an incision in the abscess to drain out the pus. Removing pus  is the first priority in treating an abscess. A root canal. This may be performed to save the tooth. Your dental care provider accesses the visible part of your tooth (crown) with a drill and removes any infected pulp. Then the space is filled and sealed off. Tooth extraction. The tooth is pulled out if it cannot be saved by other treatment. You may also receive treatment for pain, such as: Acetaminophen  or NSAIDs. Gels that contain a numbing medicine. An injection to block the pain near your nerve. Follow these instructions at home: Medicines Take over-the-counter and prescription medicines only as told by your dental care provider. If you were prescribed an antibiotic, take it as told by your dental care provider. Do not stop taking the antibiotic even if you start to feel better. If you were prescribed a gel that contains a numbing medicine, use it exactly as told in the directions. Do not use these gels for children who are younger than 57 years of age. Use an antibacterial mouth rinse as told by your dental care provider. General instructions  Gargle with a mixture of salt and water 3-4 times a day or as needed. To make salt water, completely dissolve -1 tsp (3-6 g) of salt in 1 cup (237 mL) of warm water. Eat a soft diet while your abscess is healing. Drink enough fluid to keep your urine pale yellow. Do not apply heat to the outside of your mouth. Do not use any products that contain nicotine  or tobacco. These products include cigarettes, chewing tobacco, and vaping devices, such as e-cigarettes. If you need help quitting, ask your dental care provider. Keep all follow-up visits. This is important. How is this prevented?  Excellent dental home care, which includes brushing your teeth every morning and night with fluoride toothpaste. Floss one time each day. Get regularly scheduled dental cleanings. Consider having a dental sealant applied on teeth that have deep grooves to prevent  cavities. Drink fluoridated water regularly. This includes most tap water. Check the label on bottled water to see if it contains fluoride. Reduce or eliminate sugary drinks. Eat healthy meals and snacks. Wear a mouth guard or face shield to protect your teeth while playing sports. Contact a health care provider if: Your pain is worse and is not helped by medicine. You have swelling. You see pus around the tooth. You have a fever or chills. Get help right away if: Your symptoms suddenly get worse. You have a very bad headache. You have problems breathing or swallowing. You have trouble opening your mouth. You have swelling in your neck or around your eye. These symptoms may represent a serious problem that is an emergency. Do not wait to see if the symptoms will go away. Get medical help right away. Call your local emergency services (911 in the [place] Do not drive yourself to the hospital. Summary A dental abscess is a collection of pus in or around a tooth that results from an infection. A dental abscess may result from severe tooth decay, trauma to the tooth, or severe gum disease around a tooth. Symptoms include severe pain, swelling, redness, and drainage  of pus in and around the infected tooth. The first priority in treating a dental abscess is to drain out the pus. Treatment may also involve removing damage inside the tooth (root canal) or extracting the tooth. This information is not intended to replace advice given to you by your health care provider. Make sure you discuss any questions you have with your health care provider. Document Revised: 03/04/2020 Document Reviewed: 03/04/2020 Elsevier Patient Education  2024 Elsevier Inc.    If you have been instructed to have an in-person evaluation today at a local Urgent Care facility, please use the link below. It will take you to a list of all of our available [facility], including address, phone number and hours of  operation. Please do not delay care.  [facility]  If you or a family member do not have a primary care provider, use the link below to schedule a visit and establish care. When you choose a [facility] primary care physician or advanced practice provider, you gain a long-term partner in health. Find a Primary Care Provider  Learn more about [facility]'s in-office and virtual care options: [facility] Get Care Now

## 2023-01-15 NOTE — Progress Notes (Signed)
 Virtual Visit Consent   Nancy Henson, you are scheduled for a virtual visit with a [facility] provider today. Just as with appointments in the office, your consent must be obtained to participate. Your consent will be active for this visit and any virtual visit you may have with one of our providers in the next 365 days. If you have a MyChart account, a copy of this consent can be sent to you electronically.  As this is a virtual visit, video technology does not allow for your provider to perform a traditional examination. This may limit your provider's ability to fully assess your condition. If your provider identifies any concerns that need to be evaluated in person or the need to arrange testing (such as labs, EKG, etc.), we will make arrangements to do so. Although advances in technology are sophisticated, we cannot ensure that it will always work on either your end or our end. If the connection with a video visit is poor, the visit may have to be switched to a telephone visit. With either a video or telephone visit, we are not always able to ensure that we have a secure connection.  By engaging in this virtual visit, you consent to the provision of healthcare and authorize for your insurance to be billed (if applicable) for the services provided during this visit. Depending on your insurance coverage, you may receive a charge related to this service.  I need to obtain your verbal consent now. Are you willing to proceed with your visit today? Nancy Henson has provided verbal consent on 01/15/2023 for a virtual visit (video or telephone). Nancy [name], PA-C  Date: 01/15/2023 4:21 PM  Virtual Visit via Video Note   I, Nancy Henson, connected with  Nancy Henson  ([id number], 02-Oct-1995) on 01/15/23 at  4:15 PM EST by a video-enabled telemedicine application and verified that I am speaking with the correct person using two identifiers.  Location: Patient: Virtual Visit  Location Patient: Home Provider: Virtual Visit Location Provider: Home Office   I discussed the limitations of evaluation and management by telemedicine and the availability of in person appointments. The patient expressed understanding and agreed to proceed.    History of Present Illness: Nancy Henson is a 28 y.o. who identifies as a female who was assigned female at birth, and is being seen today for possible dental infection.  HPI: Dental Pain  This is a new problem. The current episode started 1 to 4 weeks ago (Started a couple weeks ago and worsened today, overnight). The problem occurs constantly. The pain is moderate. Associated symptoms include oral bleeding and thermal sensitivity. Pertinent negatives include no difficulty swallowing, facial pain, fever or sinus pressure. Associated symptoms comments: Right sided. Treatments tried: orajel, salt water rinse. The treatment provided mild relief.  Has dental appt on 01/24/23.   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  Resting comfortably at home.  Head is  normocephalic, atraumatic.  No labored breathing.  Speech is clear and coherent with logical content.  Patient is alert and oriented at baseline.    Assessment and Plan: 1. Dental infection (Primary) - amoxicillin -clavulanate (AUGMENTIN ) 875-125 MG tablet; Take 1 tablet by mouth 2 (two) times daily.  Dispense: 20 tablet; Refill: 0  2. Antibiotic-induced yeast infection - fluconazole  (DIFLUCAN ) 150 MG tablet; Take 1 tablet (150 mg total) by mouth every 3 (three) days as needed.  Dispense: 3 tablet; Refill: 0  - Suspected infection - Augmnetin prescribed - Can use ice on outside jaw/cheek for swelling - Can also take tylenol  for pain with other medications - Discussed DenTemp putty that can be used to cover a broken tooth - [facility] scheduled dental appt for 01/24/23 - Diflucan  given as prophylaxis as patient tends to get vaginal yeast infections with antibiotic use - Seek in person evaluation if symptoms fail to improve or if they worsen   Follow Up Instructions: I discussed the assessment and treatment plan with the patient. The patient was provided an opportunity to ask questions and all were answered. The patient agreed with the plan and demonstrated an understanding of the instructions.  A copy of instructions were sent to the patient via MyChart unless otherwise noted below.    The patient was advised to call back or seek an in-person evaluation if the symptoms worsen or if the condition fails to improve as anticipated.    Nancy [name], PA-C

## 2023-01-22 ENCOUNTER — Telehealth: Payer: 59 | Admitting: Emergency Medicine

## 2023-01-22 DIAGNOSIS — N898 Other specified noninflammatory disorders of vagina: Secondary | ICD-10-CM | POA: Diagnosis not present

## 2023-01-22 NOTE — Progress Notes (Signed)
 Virtual Visit Consent   Nancy Henson, you are scheduled for a virtual visit with a [facility] provider today. Just as with appointments in the office, your consent must be obtained to participate. Your consent will be active for this visit and any virtual visit you may have with one of our providers in the next 365 days. If you have a MyChart account, a copy of this consent can be sent to you electronically.  As this is a virtual visit, video technology does not allow for your provider to perform a traditional examination. This may limit your provider's ability to fully assess your condition. If your provider identifies any concerns that need to be evaluated in person or the need to arrange testing (such as labs, EKG, etc.), we will make arrangements to do so. Although advances in technology are sophisticated, we cannot ensure that it will always work on either your end or our end. If the connection with a video visit is poor, the visit may have to be switched to a telephone visit. With either a video or telephone visit, we are not always able to ensure that we have a secure connection.  By engaging in this virtual visit, you consent to the provision of healthcare and authorize for your insurance to be billed (if applicable) for the services provided during this visit. Depending on your insurance coverage, you may receive a charge related to this service.  I need to obtain your verbal consent now. Are you willing to proceed with your visit today? Nancy Henson has provided verbal consent on 01/22/2023 for a virtual visit (video or telephone). Jon CHRISTELLA Belt, NP  Date: 01/22/2023 8:21 AM  Virtual Visit via Video Note   I, Jon CHRISTELLA Belt, connected with  Nancy Henson  ([id number], 1995-11-22) on 01/22/23 at  8:15 AM EST by a video-enabled telemedicine application and verified that I am speaking with the correct person using two identifiers.  Location: Patient: Virtual Visit Location  Patient: Home Provider: Virtual Visit Location Provider: Home Office   I discussed the limitations of evaluation and management by telemedicine and the availability of in person appointments. The patient expressed understanding and agreed to proceed.    History of Present Illness: Nancy Henson is a 28 y.o. who identifies as a female who was assigned female at birth, and is being seen today for vaginal itching. Started with use of augmentin  for dental infection. STill has 3 or more days to go of augmentin . At time of rx augmentin , was rx 3 doses of diflucan . Has taken 2. Has vaginal itching - wonders when to take 3rd dose and if she should be doing anything else.   Dental pain is gone - feels better. 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  Resting comfortably  at home.  Head is normocephalic, atraumatic.  No labored breathing.  Speech is clear and coherent with logical content.  Patient is alert and oriented at baseline.    Assessment and Plan: 1. Vaginal itching (Primary)  Rec  otc monistat external vag cream  Follow Up Instructions: I discussed the assessment and treatment plan with the patient. The patient was provided an opportunity to ask questions and all were answered. The patient agreed with the plan and demonstrated an understanding of the instructions.  A copy of instructions were sent to the patient via MyChart unless otherwise noted below.   The patient was advised to call back or seek an in-person evaluation if the symptoms worsen or if the condition fails to improve as anticipated.    Jon CHRISTELLA Belt, NP

## 2023-01-22 NOTE — Patient Instructions (Signed)
  Nancy Henson, thank you for joining Jon CHRISTELLA Belt, NP for today's virtual visit.  While this provider is not your primary care provider (PCP), if your PCP is located in our provider database this encounter information will be shared with them immediately following your visit.   A [facility] MyChart account gives you access to today's visit and all your visits, tests, and labs performed at [facility]  click here if you don't have a [facility] MyChart account or go to [email or web address]  Consent: (Patient) Nancy Henson provided verbal consent for this virtual visit at the beginning of the encounter.  Current Medications:  Current Outpatient Medications:    amoxicillin -clavulanate (AUGMENTIN ) 875-125 MG tablet, Take 1 tablet by mouth 2 (two) times daily., Disp: 20 tablet, Rfl: 0   fluconazole  (DIFLUCAN ) 150 MG tablet, Take 1 tablet (150 mg total) by mouth every 3 (three) days as needed., Disp: 3 tablet, Rfl: 0   Medications ordered in this encounter:  No orders of the defined types were placed in this encounter.    *If you need refills on other medications prior to your next appointment, please contact your pharmacy*  Follow-Up: Call back or seek an in-person evaluation if the symptoms worsen or if the condition fails to improve as anticipated.  [facility] [phone]  Other Instructions  I recommend you take your last dose of yeast medicine (the fluconazole - the pink pill) on the last day of your antibiotics.   I also recommend you add using over the counter monistat external vaginal cream to your treatments - you can also use the intravaginal monistat (or generic miconazole) too.    If you have been instructed to have an in-person evaluation today at a local Urgent Care facility, please use the link below. It will take you to a list of all of our available [facility], including address, phone number and hours of  operation. Please do not delay care.  [facility]  If you or a family member do not have a primary care provider, use the link below to schedule a visit and establish care. When you choose a [facility] primary care physician or advanced practice provider, you gain a long-term partner in health. Find a Primary Care Provider  Learn more about [facility]'s in-office and virtual care options: [facility] Get Care Now

## 2023-01-30 ENCOUNTER — Telehealth: Payer: 59 | Admitting: Physician Assistant

## 2023-01-30 DIAGNOSIS — J039 Acute tonsillitis, unspecified: Secondary | ICD-10-CM | POA: Diagnosis not present

## 2023-01-30 MED ORDER — AZITHROMYCIN 250 MG PO TABS: ORAL_TABLET | ORAL | 0 refills | Status: AC

## 2023-01-30 NOTE — Patient Instructions (Signed)
Nancy Henson, thank you for joining Nancy Climes, PA-C for today's virtual visit.  While this provider is not your primary Henson provider (PCP), if your PCP is located in our provider database this encounter information will be shared with them immediately following your visit.   A Nancy [facility]enson MyChart account gives you access to today's visit and all your visits, tests, and labs performed at [facility] " click here if you don't have a Nancy Henson MyChart account or go to [email or web address]  Consent: (Patient) Nancy Henson provided verbal consent for this virtual visit at the beginning of the encounter.  Current Medications:  Current Outpatient Medications:    amoxicillin-clavulanate (AUGMENTIN) 875-125 MG tablet, Take 1 tablet by mouth 2 (two) times daily., Disp: 20 tablet, Rfl: 0   fluconazole (DIFLUCAN) 150 MG tablet, Take 1 tablet (150 mg total) by mouth every 3 (three) days as needed., Disp: 3 tablet, Rfl: 0   Medications ordered in this encounter:  No orders of the defined types were placed in this encounter.    *If you need refills on other medications prior to your next appointment, please contact your pharmacy*  Follow-Up: Call back or seek an in-person evaluation if the symptoms worsen or if the condition fails to improve as anticipated.  Nancy Henson [phone]  Other Instructions Strep Throat, Adult Strep throat is an infection of the throat. It is caused by germs (bacteria). Strep throat is common during the cold months of the year. It mostly affects children who are 39-44 years old. However, people of all ages can get it at any time of the year. This infection spreads from person to person through coughing, sneezing, or having close contact. What are the causes? This condition is caused by the Streptococcus pyogenes germ. What increases the risk? You [facility]enson for young children. Children are more likely to get strep throat  and may spread it to others. You go to crowded places. Germs can spread easily in such places. You kiss or touch someone who has strep throat. What are the signs or symptoms? Fever or chills. Redness, swelling, or pain in the tonsils or throat. Pain or trouble when swallowing. White or yellow spots on the tonsils or throat. Tender glands in the neck and under the jaw. Bad breath. Red rash all over the body. This is rare. How is this treated? Medicines that kill germs (antibiotics). Medicines that treat pain or fever. These include: Ibuprofen or acetaminophen. Aspirin, only for people who are over the age of 67. Cough drops. Throat sprays. Follow these instructions at home: Medicines  Take over-the-counter and prescription medicines only as told by your doctor. Take your antibiotic medicine as told by your doctor. Do not stop taking the antibiotic even if you start to feel better. Eating and drinking  If you have trouble swallowing, eat soft foods until your throat feels better. Drink enough fluid to keep your pee (urine) pale yellow. To help with pain, you may have: Warm fluids, such as soup and tea. Cold fluids, such as frozen desserts or popsicles. General instructions Rinse your mouth (gargle) with a salt-water mixture 3-4 times a day or as needed. To make a salt-water mixture, dissolve -1 tsp (3-6 g) of salt in 1 cup (237 mL) of warm water. Rest as much as you can. Stay home from work or school until you have been taking antibiotics for 24 hours. Do not smoke or use any products that contain nicotine or tobacco.  If you need help quitting, ask your doctor. Keep all follow-up visits. How is this prevented?  Do not share food, drinking cups, or personal items. They can cause the germs to spread. Wash your hands well with soap and water. Make sure that all people in your house wash their hands well. Have family members tested if they have a fever or a sore throat. They may  need an antibiotic if they have strep throat. Contact a doctor if: You have swelling in your neck that keeps getting bigger. You get a rash, cough, or earache. You cough up a thick fluid that is green, yellow-brown, or bloody. You have pain that does not get better with medicine. Your symptoms get worse instead of getting better. You have a fever. Get help right away if: You vomit. You have a very bad headache. Your neck hurts or feels stiff. You have chest pain or are short of breath. You have drooling, very bad throat pain, or changes in your voice. Your neck is swollen, or the skin gets red and tender. Your mouth is dry, or you are peeing less than normal. You keep feeling more tired or have trouble waking up. Your joints are red or painful. These symptoms may be an emergency. Do not wait to see if the symptoms will go away. Get help right away. Call your local emergency services (911 in the [place] Summary Strep throat is an infection of the throat. It is caused by germs (bacteria). This infection can spread from person to person through coughing, sneezing, or having close contact. Take your medicines, including antibiotics, as told by your doctor. Do not stop taking the antibiotic even if you start to feel better. To prevent the spread of germs, wash your hands well with soap and water. Have others do the same. Do not share food, drinking cups, or personal items. Get help right away if you have a bad headache, chest pain, shortness of breath, a stiff or painful neck, or you vomit. This information is not intended to replace advice given to you by your health [facility] provider. Make sure you discuss any questions you have with your health [facility] provider. Document Revised: 04/20/2020 Document Reviewed: 04/20/2020 Elsevier Patient Education  2024 [employer]   If you have been instructed to have an in-person evaluation today at a local Urgent [facility] facility, please use the link below. It  will take you to a list of all of our available [facility], including address, phone number and hours of operation. Please do not delay Henson.  [facility]  If you or a family member do not have a primary Henson provider, use the link below to schedule a visit and establish Henson. When you choose a [facility] primary [facility] physician or advanced practice provider, you gain a long-term partner in health. Find a Primary Henson Provider  Learn more about [facility]'s in-office and virtual Henson options: [facility] - Get Henson Now

## 2023-01-30 NOTE — Progress Notes (Signed)
Virtual Visit Consent   Nancy Henson, you are scheduled for a virtual visit with a [facility] provider today. Just as with appointments in the office, your consent must be obtained to participate. Your consent will be active for this visit and any virtual visit you may have with one of our providers in the next 365 days. If you have a MyChart account, a copy of this consent can be sent to you electronically.  As this is a virtual visit, video technology does not allow for your provider to perform a traditional examination. This may limit your provider's ability to fully assess your condition. If your provider identifies any concerns that need to be evaluated in person or the need to arrange testing (such as labs, EKG, etc.), we will make arrangements to do so. Although advances in technology are sophisticated, we cannot ensure that it will always work on either your end or our end. If the connection with a video visit is poor, the visit may have to be switched to a telephone visit. With either a video or telephone visit, we are not always able to ensure that we have a secure connection.  By engaging in this virtual visit, you consent to the provision of healthcare and authorize for your insurance to be billed (if applicable) for the services provided during this visit. Depending on your insurance coverage, you may receive a charge related to this service.  I need to obtain your verbal consent now. Are you willing to proceed with your visit today? Nancy Henson has provided verbal consent on 01/30/2023 for a virtual visit (video or telephone). Piedad Climes, [place]  Date: 01/30/2023 6:12 PM  Virtual Visit via Video Note   I, Piedad Climes, connected with  Nancy Henson  ([id number], 09-26-1995) on 01/30/23 at  6:00 PM EST by a video-enabled telemedicine application and verified that I am speaking with the correct person using two identifiers.  Location: Patient: Virtual Visit  Location Patient: Home Provider: Virtual Visit Location Provider: Home Office   I discussed the limitations of evaluation and management by telemedicine and the availability of in person appointments. The patient expressed understanding and agreed to proceed.    History of Present Illness: Nancy Henson is a 28 y.o. who identifies as a female who was assigned female at birth, and is being seen today for URI. Endorses symptoms over the past 2 days with sore throat, and mild cough from throat irritation. Notes substantial odynophagia. Some muscle soreness but no overall body aches. Unsure of fever. Notes her daughter is sick currently with similar symptoms but much worse, with strep throat. 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  Resting comfortably at home.  Head is normocephalic, atraumatic.  No labored  breathing. Speech is clear and coherent with logical content.  Patient is alert and oriented at baseline.  Posterior oropharyngeal erythema with right tonsillar swelling noted with exudate. Uvula is midline and without edema or lesion.   Assessment and Plan: 1. Exudative tonsillitis (Primary) - azithromycin (ZITHROMAX) 250  MG tablet; Take 2 tablets on day 1, then 1 tablet daily on days 2 through 5  Dispense: 6 tablet; Refill: 0  Supportive measures and OTC medications reviewed. Azithromycin per orders. Follow-up in person for any non-resolving, new or worsening symptoms.  Follow Up Instructions: I discussed the assessment and treatment plan with the patient. The patient was provided an opportunity to ask questions and all were answered. The patient agreed with the plan and demonstrated an understanding of the instructions.  A copy of instructions were sent to the patient via MyChart unless otherwise noted below.   The patient was advised to call back or seek an in-person evaluation if the symptoms worsen or if the condition fails to improve as anticipated.    Piedad Climes, PA-C

## 2024-01-08 ENCOUNTER — Telehealth: Payer: Self-pay | Admitting: Family Medicine

## 2024-01-08 NOTE — Progress Notes (Signed)
 Pt did not show for visit [facility]
